# Patient Record
Sex: Male | Born: 1960 | Race: Black or African American | Hispanic: No | Marital: Single | Smoking: Never smoker
Health system: Southern US, Community
[De-identification: ages and names within clinical notes are randomized; demographics above are authoritative.]

## PROBLEM LIST (undated history)

## (undated) DIAGNOSIS — N529 Male erectile dysfunction, unspecified: Secondary | ICD-10-CM

## (undated) DIAGNOSIS — R229 Localized swelling, mass and lump, unspecified: Secondary | ICD-10-CM

## (undated) DIAGNOSIS — R319 Hematuria, unspecified: Secondary | ICD-10-CM

## (undated) DIAGNOSIS — E785 Hyperlipidemia, unspecified: Secondary | ICD-10-CM

## (undated) DIAGNOSIS — I10 Essential (primary) hypertension: Secondary | ICD-10-CM

## (undated) HISTORY — DX: Male erectile dysfunction, unspecified: N52.9

## (undated) HISTORY — DX: Hematuria, unspecified: R31.9

## (undated) HISTORY — PX: HERNIA REPAIR: SHX51

## (undated) HISTORY — DX: Essential (primary) hypertension: I10

## (undated) HISTORY — PX: ABDOMINAL SURGERY: SHX537

## (undated) HISTORY — DX: Hyperlipidemia, unspecified: E78.5

## (undated) HISTORY — DX: Localized swelling, mass and lump, unspecified: R22.9

---

## 2012-03-12 DIAGNOSIS — R319 Hematuria, unspecified: Secondary | ICD-10-CM

## 2012-03-12 HISTORY — DX: Hematuria, unspecified: R31.9

## 2012-04-23 ENCOUNTER — Telehealth: Payer: Self-pay | Admitting: Oncology

## 2012-04-23 NOTE — Telephone Encounter (Signed)
S/W pt finance' in re NP appt 9/18 @ 3 w/ Dr. Gaylyn Rong Referring Dr. Cain Saupe Dx-Abn CT subcutaneous nodules.  NP packet mail

## 2012-04-23 NOTE — Telephone Encounter (Signed)
C/D on 9/12 for appt 9/19. Attached ROI form to be filled out by pt to request records form [facility].

## 2012-04-24 ENCOUNTER — Encounter: Payer: Self-pay | Admitting: Oncology

## 2012-04-30 ENCOUNTER — Ambulatory Visit: Payer: Self-pay | Admitting: Oncology

## 2012-04-30 ENCOUNTER — Other Ambulatory Visit: Payer: Self-pay | Admitting: Lab

## 2012-04-30 ENCOUNTER — Ambulatory Visit: Payer: Self-pay

## 2012-05-08 ENCOUNTER — Other Ambulatory Visit: Payer: Self-pay

## 2012-05-08 ENCOUNTER — Encounter: Payer: Self-pay | Admitting: Oncology

## 2012-05-08 ENCOUNTER — Ambulatory Visit: Payer: Self-pay

## 2012-05-08 ENCOUNTER — Ambulatory Visit (HOSPITAL_BASED_OUTPATIENT_CLINIC_OR_DEPARTMENT_OTHER): Payer: BC Managed Care – PPO | Admitting: Oncology

## 2012-05-08 VITALS — BP 162/102 | HR 67 | Temp 98.5°F | Resp 20 | Ht 67.0 in | Wt 183.3 lb

## 2012-05-08 DIAGNOSIS — R229 Localized swelling, mass and lump, unspecified: Secondary | ICD-10-CM

## 2012-05-08 HISTORY — DX: Localized swelling, mass and lump, unspecified: R22.9

## 2012-05-08 NOTE — Patient Instructions (Addendum)
1.  Issue:  Subcutaneous nodules. 2.  Possibility:  Most likely benign (non cancerous).  However, since there are multiple, we may consider biopsy either by General Surgery or Dermatology. 3.  Follow up:  Pending result.

## 2012-05-09 NOTE — Progress Notes (Signed)
[facility]  Telephone:([phone]) [phone] Fax:(336) [phone]   MEDICAL ONCOLOGY - INITIAL CONSULATION    Referral MD:  Dr. Cain Saupe, M.D.   Reason for Referral: numerous subcutaneous nodules.   HPI:  [name] is a 51 year-old [place] American man with no significant past medical history.  He was in usual state of health until 03/2012 when he noted two day-duration of gross hematuria.  He was evaluated by Dr. Brunilda Payor.  He was given an empiric course of antibiotic with resolution of his hematuria.  He was recommended to undergo cystoscopy; however, he declined due to resolution of his hematuria.  He had an abdominal pelvic CT scan which was performed at [facility] on 04/15/2012 which was normal.  However, there were numerous subcutaneous nodules.  Therefore, he was kindly referred to the [facility] for evaluation.   [name] presented to the clinic today by himself.  He reported feeling well.  He no longer has hematuria.  With regard to the SQ nodules, he has had them for the past 15 years.  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  The largest ones are on his left flank and left elbow.  The nodules are throughout his body without any skin tag.  There is no cutaneous lesion, erythema.  The nodules do not burst open or cause pain without friction.  A male cousin who is his age from his father's side have similar nodules.   Patient denies fever, anorexia, weight loss, fatigue, headache, visual changes, confusion, drenching night sweats, palpable lymph node swelling, mucositis, odynophagia, dysphagia, nausea vomiting, jaundice, chest pain, palpitation, shortness of breath, dyspnea on exertion, productive cough, gum bleeding, epistaxis, hematemesis, hemoptysis, abdominal pain, abdominal swelling, early satiety, melena, hematochezia, hematuria, skin  rash, spontaneous bleeding, joint swelling, joint pain, heat or cold intolerance, bowel bladder incontinence, back pain, focal motor weakness, paresthesia, depression, suicidal or homicidal ideation, feeling hopelessness.     Past Medical History  Diagnosis Date  . HTN (hypertension)   . ED (erectile dysfunction)   . Subcutaneous nodule   . Hematuria 03/2012    evaluated by Kr. Nesi, CT showed no renal abn; recommended cystoscopy in 9/13; he declined.  . 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 No pertinent past surgical history.:  Current Outpatient Prescriptions  Medication Sig Dispense Refill  . amLODipine (NORVASC) 5 MG tablet Take 5 mg by mouth daily.         No Known Allergies:  Family History  Problem Relation Age of Onset  . Hyperlipidemia Mother   . Cancer Father     head and neck   . Heart defect Brother   . Heart defect Son   :  History   Social History  . Marital Status: Significant Other    Spouse Name: N/A    Number of Children: 6  . Years of Education: N/A   Occupational History  .  [employer]   Social History Main Topics  . Smoking status: Never Smoker   . Smokeless tobacco: Never Used  . Alcohol Use: No  . Drug Use: No  . Sexually Active:    Other Topics Concern  . Not on file   Social History Narrative  . No narrative on file  :  Pertinent items are noted in HPI.  Exam: ECOG 0  General:  well-nourished man, in no acute distress.  Eyes:  no scleral icterus.  ENT:  There were no oropharyngeal lesions.  Neck was without thyromegaly.  Lymphatics:  Negative cervical, supraclavicular or axillary adenopathy.  Respiratory: lungs were clear bilaterally without wheezing or crackles.  Cardiovascular:  Regular rate and rhythm, S1/S2, without murmur, rub or gallop.  There was no pedal edema.  GI:  abdomen was soft, flat, nontender, nondistended, without organomegaly.  Muscoloskeletal:  no spinal tenderness of palpation of vertebral  spine.  Skin exam was without echymosis, petichae.  There were innumerable subcutaneous nodules through the body:  His back, trunk, abdomen, bilateral arms.  There was no cutaneous lesions.  There was tenderness to palpation of the larger nodules (2-3cm).  There was no skin breakthrough, erythema, purulent discharge.   Neuro exam was nonfocal.  Patient was able to get on and off exam table without assistance.  Gait was normal.  Patient was alerted and oriented.  Attention was good.   Language was appropriate.  Mood was normal without depression.  Speech was not pressured.  Thought content was not tangential.     Assessment and Plan:   Issue:  Diffuse subcutaneous nodules:    Differential:  Long list:  But high on the list is inflammatory conditions (rheumatoid, sarcoid) since it could be hereditary.  Less likely are malignant causes such as sarcoma.  Less likely infectious (HIV, karposi) due to him being asymptomatic and potential familial nature of the disease.   Work up:  Given the extensive nature and recent progression, biopsy is indicated.  I referred patient to [facility].  I deferred sending extensive serologic work up until after biopsy.   Treatment:  Pending work up.    Thank you for this referral.     The length of time of the face-to-face encounter was 30 minutes. More than 50% of time was spent counseling and coordination of care.

## 2012-05-11 ENCOUNTER — Telehealth: Payer: Self-pay | Admitting: Oncology

## 2012-05-11 NOTE — Telephone Encounter (Signed)
lmonvm adviisng the pt of his appt with the surgeon on 05/21/2012 with dr Derrell Lolling and the f/u appt with dr ha in november

## 2012-05-21 ENCOUNTER — Encounter (INDEPENDENT_AMBULATORY_CARE_PROVIDER_SITE_OTHER): Payer: Self-pay | Admitting: General Surgery

## 2012-05-21 ENCOUNTER — Ambulatory Visit (INDEPENDENT_AMBULATORY_CARE_PROVIDER_SITE_OTHER): Payer: BC Managed Care – PPO | Admitting: General Surgery

## 2012-05-21 VITALS — BP 144/92 | HR 74 | Temp 98.4°F | Resp 14 | Ht 65.0 in | Wt 182.6 lb

## 2012-05-21 DIAGNOSIS — D179 Benign lipomatous neoplasm, unspecified: Secondary | ICD-10-CM
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
## 2012-06-05 ENCOUNTER — Telehealth (INDEPENDENT_AMBULATORY_CARE_PROVIDER_SITE_OTHER): Payer: Self-pay

## 2012-06-05 ENCOUNTER — Other Ambulatory Visit (INDEPENDENT_AMBULATORY_CARE_PROVIDER_SITE_OTHER): Payer: Self-pay | Admitting: General Surgery

## 2012-06-05 DIAGNOSIS — D1739 Benign lipomatous neoplasm of skin and subcutaneous tissue of other sites: Secondary | ICD-10-CM

## 2012-06-05 NOTE — Telephone Encounter (Signed)
Pt's girlfriend called to report that the pt's arm is very swollen.  I explained that some swelling is normal, but she is concerned.  Please advise.

## 2012-06-05 NOTE — Telephone Encounter (Signed)
Called pt girlfriend back to see how is arm is doing and see how much swelling he had in his arm . She stated that he left his home and she stated that she was scared that he was going to hurt them self because of the pain he was in. I told her to keep an eye on it and that there is always someone on call over the weekend

## 2012-06-08 ENCOUNTER — Telehealth (INDEPENDENT_AMBULATORY_CARE_PROVIDER_SITE_OTHER): Payer: Self-pay

## 2012-06-08 ENCOUNTER — Encounter (INDEPENDENT_AMBULATORY_CARE_PROVIDER_SITE_OTHER): Payer: Self-pay | Admitting: General Surgery

## 2012-06-08 NOTE — Telephone Encounter (Signed)
Pt called stating the swelling in his arm is going down. No redness,no fever and lower arm and hand are not swollen. 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 He states he is not ready to go back to work yet because his upper arm still has swelling and is sore. I advised him there is no notation in system as to how long he will need to be out of work. I advised him I will send msg to Dr Derrell Lolling and his assistant to review. Pt can be reached at [phone].

## 2012-06-10 NOTE — Telephone Encounter (Signed)
Close encounter 

## 2012-06-19 ENCOUNTER — Encounter (INDEPENDENT_AMBULATORY_CARE_PROVIDER_SITE_OTHER): Payer: Self-pay | Admitting: General Surgery

## 2012-06-19 ENCOUNTER — Encounter (INDEPENDENT_AMBULATORY_CARE_PROVIDER_SITE_OTHER): Payer: Self-pay

## 2012-06-19 ENCOUNTER — Ambulatory Visit (INDEPENDENT_AMBULATORY_CARE_PROVIDER_SITE_OTHER): Payer: BC Managed Care – PPO | Admitting: General Surgery

## 2012-06-19 VITALS — BP 136/80 | HR 70 | Temp 97.4°F | Resp 14 | Ht 66.5 in | Wt 190.0 lb

## 2012-06-19 DIAGNOSIS — Z09 Encounter for follow-up examination after completed treatment for conditions other than malignant neoplasm: Secondary | ICD-10-CM

## 2012-06-19 NOTE — Progress Notes (Signed)
Patient ID: Clifford Jenkins, male   DOB: 06/12/1961, 51 y.o.   MRN: [id number] The patient is a 51 year old male status post lipoma excisionson the left upper extremity.Marland Kitchen Exhibit healing well. He does have an area of concern of his left external form which has developed a fluid collection; however is not infected.there's been no drainage from this area no erythema. .  On exam: Was clean dry and intact. His left external forearm incision site has fluid collection areas of age her erythema.  Assessment and plan: 1.  Patient returned x4 weeks for wound check.  2. Patient applied Ace bandage to his forearm protected as well as increased the fluid reabsorption rate.  3. The patient is okay return work.

## 2012-07-10 ENCOUNTER — Ambulatory Visit: Payer: BC Managed Care – PPO | Admitting: Oncology

## 2012-07-10 NOTE — Progress Notes (Signed)
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  I [facility] the [facility].  I sent him a letter advising him to return to Gen Surg if any of the nodules significantly increase in size.

## 2012-07-17 ENCOUNTER — Ambulatory Visit (INDEPENDENT_AMBULATORY_CARE_PROVIDER_SITE_OTHER): Payer: BC Managed Care – PPO | Admitting: General Surgery

## 2012-07-17 ENCOUNTER — Encounter (INDEPENDENT_AMBULATORY_CARE_PROVIDER_SITE_OTHER): Payer: Self-pay | Admitting: General Surgery

## 2012-07-17 ENCOUNTER — Encounter (INDEPENDENT_AMBULATORY_CARE_PROVIDER_SITE_OTHER): Payer: BC Managed Care – PPO | Admitting: General Surgery

## 2012-07-17 VITALS — BP 117/75 | HR 70 | Temp 97.8°F | Resp 12 | Ht 66.5 in | Wt 184.8 lb

## 2012-07-17 DIAGNOSIS — Z09 Encounter for follow-up examination after completed treatment for conditions other than malignant neoplasm: Secondary | ICD-10-CM

## 2012-07-17 NOTE — Progress Notes (Signed)
Patient ID: ROD MAJERUS, male   DOB: Oct 06, 1960, 51 y.o.   MRN: [id number] The patient is a 51 year old male status post left upper extremity lipoma removals. Patient is to do well postoperatively. Some of her previous additional postoperative ecchymosis is resolved.  On exam was clean dry and intact.  Assessment and plan.  Patient to follow prn   Patient resumed daily activities.

## 2012-07-26 ENCOUNTER — Ambulatory Visit (INDEPENDENT_AMBULATORY_CARE_PROVIDER_SITE_OTHER): Payer: BC Managed Care – PPO | Admitting: Family Medicine

## 2012-07-26 VITALS — BP 140/98 | HR 62 | Temp 98.0°F | Resp 18 | Ht 66.58 in | Wt 189.0 lb

## 2012-07-26 DIAGNOSIS — R300 Dysuria: Secondary | ICD-10-CM

## 2012-07-26 DIAGNOSIS — N342 Other urethritis: Secondary | ICD-10-CM

## 2012-07-26 DIAGNOSIS — R319 Hematuria, unspecified: Secondary | ICD-10-CM

## 2012-07-26 LAB — POCT CBC
Granulocyte percent: 54.9 %G (ref 37–80)
Lymph, poc: 1.9 (ref 0.6–3.4)
MCHC: 31.7 g/dL — AB (ref 31.8–35.4)
MCV: 94.7 fL (ref 80–97)
MID (cbc): 0.5 (ref 0–0.9)
POC LYMPH PERCENT: 35.8 %L (ref 10–50)
Platelet Count, POC: 197 10*3/uL (ref 142–424)
RDW, POC: 14 %

## 2012-07-26 LAB — POCT UA - MICROSCOPIC ONLY
Mucus, UA: POSITIVE
Yeast, UA: NEGATIVE

## 2012-07-26 LAB — POCT URINALYSIS DIPSTICK
Bilirubin, UA: NEGATIVE
Ketones, UA: NEGATIVE
Leukocytes, UA: NEGATIVE
Nitrite, UA: NEGATIVE
Protein, UA: NEGATIVE

## 2012-07-26 MED ORDER — AZITHROMYCIN 250 MG PO TABS: ORAL_TABLET | ORAL | Status: DC

## 2012-07-26 MED ORDER — CEFTRIAXONE SODIUM 1 G IJ SOLR
250.0000 mg | Freq: Once | INTRAMUSCULAR | Status: AC
  Administered 2012-07-26: 250 mg via INTRAMUSCULAR

## 2012-07-26 MED ORDER — CEFTRIAXONE SODIUM 1 G IJ SOLR: 250.0000 mg | INTRAMUSCULAR | Status: DC

## 2012-07-26 NOTE — Progress Notes (Signed)
Subjective:    Patient ID: Clifford Jenkins, male    DOB: October 11, 1960, 51 y.o.   MRN: [id number]  HPI Clifford Jenkins is a 51 y.o. male Recreated note form office visit this morning (encounter accidentaly cancelled in error when patient returned for uriprobe collection). He was given Rocephin 250mg  injection, as well as rx for azithromycin at afternoon office visit - approx 12:30pm.  Subjective:    Patient ID: Clifford Jenkins, male    DOB: 30-Jan-1961, 51 y.o.   MRN: [id number]  HPI Clifford Jenkins is a 51 y.o. male Started yesterday morning with blood in urine, and had burning with urination.  Notices blood at end of urination.  Frequency this am.  No fever, no back pain, no N/V.   Similar sx's in July - told had some kind of infection   - seen by primary doctor - Clifford Jenkins at [facility]  No known hx of prostate problems - last checked in July - told was normal. No known hx of kidney stones.   Tx: no otc treatments.  Here with GF of 2 years.   No hx of STI's.  Notices a slight discharge in   Hx of HTN - followed by Dr. Jillyn Jenkins.  Review of Systems  Constitutional: Negative for fever, chills, fatigue and unexpected weight change.  Eyes: Negative for visual disturbance.  Respiratory: Negative for cough, chest tightness and shortness of breath.   Cardiovascular: Negative for chest pain, palpitations and leg swelling.  Gastrointestinal: Negative for abdominal pain and blood in stool.  Genitourinary: Positive for dysuria, frequency, hematuria and discharge (slight amt seen in past in underwear.  - had tests in July for STD's and negative?). Negative for testicular pain.  Musculoskeletal: Negative for back pain.  Skin: Negative for rash.  Neurological: Negative for dizziness, light-headedness and headaches.        Objective:   Physical Exam  Constitutional: He is oriented to person, place, and time. He appears well-developed and well-nourished.  HENT:  Head:  Normocephalic and atraumatic.  Cardiovascular: Normal rate, regular rhythm, normal heart sounds and intact distal pulses.   Pulmonary/Chest: Effort normal and breath sounds normal.  Abdominal: Soft. Bowel sounds are normal. He exhibits no distension. There is no tenderness. Hernia confirmed negative in the right inguinal area and confirmed negative in the left inguinal area.  Genitourinary: Testes normal and penis normal. Right testis shows no mass and no tenderness. Left testis shows no mass. No penile erythema or penile tenderness. No discharge found.  Lymphadenopathy:       Right: No inguinal adenopathy present.       Left: No inguinal adenopathy present.  Neurological: He is alert and oriented to person, place, and time.  Skin: Skin is warm. No rash noted.  Psychiatric: He has a normal mood and affect. His behavior is normal.   Results for orders placed in visit on 07/26/12  GC/CHLAMYDIA PROBE AMP, URINE      Component Value Range   Chlamydia, Swab/Urine, PCR NEGATIVE  NEGATIVE   GC Probe Amp, Urine NEGATIVE  NEGATIVE       Assessment & Plan:  Clifford Jenkins is a 51 y.o. male 1. Urethritis  GC/chlamydia probe amp, urine, POCT CBC, PSA, Urine culture, POCT urinalysis dipstick, POCT UA - Microscopic Only, azithromycin (ZITHROMAX) 250 MG tablet, DISCONTINUED: cefTRIAXone (ROCEPHIN) injection 250 mg  2. Hematuria  azithromycin (ZITHROMAX) 250 MG tablet   Urethritis with hematuria - returned to clinic for urirobe  at approximately 12:30.  uripribe obtained. Rocephin 250mg  IM, start azithromycin 250mg  - 4 x1. If symptoms not improving with above, may need urology eval for hematuria. Understanding expressed.  HTN - decreased control.  Check outside office and if still elevated - rtc or primary MD to discuss meds.        Review of Systems     Objective:   Physical Exam        Assessment & Plan:

## 2012-07-26 NOTE — Progress Notes (Signed)
Subjective:    Patient ID: Clifford Jenkins, male    DOB: June 14, 1961, 51 y.o.   MRN: [id number]  HPI Clifford Jenkins is a 51 y.o. male Started yesterday morning with blood in urine, and had burning with urination.  Notices blood at end of urination.  Frequency this am.  No fever, no back pain, no N/V.   Similar sx's in July - told had some kind of infection   - seen by primary doctor - [name] at [facility].  No known hx of prostate problems - last checked in July - told was normal. No known hx of kidney stones.   Tx: no otc treatments.  Here with GF of 2 years.   No hx of STI's.  Notices a slight discharge in   Hx of HTN - followed by Dr. Jillyn Hidden.  Review of Systems  Constitutional: Negative for fever, chills, fatigue and unexpected weight change.  Eyes: Negative for visual disturbance.  Respiratory: Negative for cough, chest tightness and shortness of breath.   Cardiovascular: Negative for chest pain, palpitations and leg swelling.  Gastrointestinal: Negative for abdominal pain and blood in stool.  Genitourinary: Positive for dysuria, frequency, hematuria and discharge (slight amt seen in past in underwear.  - had tests in July for STD's and negative?). Negative for testicular pain.  Musculoskeletal: Negative for back pain.  Skin: Negative for rash.  Neurological: Negative for dizziness, light-headedness and headaches.        Objective:   Physical Exam  Constitutional: He is oriented to person, place, and time. He appears well-developed and well-nourished.  HENT:  Head: Normocephalic and atraumatic.  Cardiovascular: Normal rate, regular rhythm, normal heart sounds and intact distal pulses.   Pulmonary/Chest: Effort normal and breath sounds normal.  Abdominal: Soft. Bowel sounds are normal. He exhibits no distension. There is no tenderness. Hernia confirmed negative in the right inguinal area and confirmed negative in the left inguinal area.  Genitourinary: Testes  normal and penis normal. Right testis shows no mass and no tenderness. Left testis shows no mass. No penile erythema or penile tenderness. No discharge found.  Lymphadenopathy:       Right: No inguinal adenopathy present.       Left: No inguinal adenopathy present.  Neurological: He is alert and oriented to person, place, and time.  Skin: Skin is warm. No rash noted.  Psychiatric: He has a normal mood and affect. His behavior is normal.   Results for orders placed in visit on 07/26/12  POCT URINALYSIS DIPSTICK      Component Value Range   Color, UA yellow     Clarity, UA clear     Glucose, UA neg     Bilirubin, UA neg     Ketones, UA neg     Spec Grav, UA >=1.030     Blood, UA small     pH, UA 5.5     Protein, UA neg     Urobilinogen, UA 0.2     Nitrite, UA neg     Leukocytes, UA Negative    POCT UA - MICROSCOPIC ONLY      Component Value Range   WBC, Ur, HPF, POC 2-8     RBC, urine, microscopic tntc     Bacteria, U Microscopic 3+     Mucus, UA pos     Epithelial cells, urine per micros 2-4     Crystals, Ur, HPF, POC neg     Casts, Ur, LPF, POC  RBC     Yeast, UA neg    POCT CBC      Component Value Range   WBC 5.2  4.6 - 10.2 K/uL   Lymph, poc 1.9  0.6 - 3.4   POC LYMPH PERCENT 35.8  10 - 50 %L   MID (cbc) 0.5  0 - 0.9   POC MID % 9.3  0 - 12 %M   POC Granulocyte 2.9  2 - 6.9   Granulocyte percent 54.9  37 - 80 %G   RBC 5.49  4.69 - 6.13 M/uL   Hemoglobin 16.5  14.1 - 18.1 g/dL   HCT, POC 16.1  09.6 - 53.7 %   MCV 94.7  80 - 97 fL   MCH, POC 30.1  27 - 31.2 pg   MCHC 31.7 (*) 31.8 - 35.4 g/dL   RDW, POC 04.5     Platelet Count, POC 197  142 - 424 K/uL   MPV 9.6  0 - 99.8 fL       Assessment & Plan:  Clifford Jenkins is a 51 y.o. male 1. Hematuria  POCT urinalysis dipstick, POCT UA - Microscopic Only, POCT CBC, PSA, Urine culture  2. Dysuria  POCT urinalysis dipstick, POCT UA - Microscopic Only, POCT CBC, PSA, Urine culture  3. Urethritis     Urethritis  with hematuria - returned to clinic for urirobe at approximately 12:30.  uripribe obtained. Rocephin 250mg  IM, start azithromycin 250mg  - 4 x1. If symptoms not improving with above, may need urology eval for hematuria. Understanding expressed.  HTN - decreased control.  Check outside office and if still elevated - rtc or primary MD to discuss meds.

## 2012-07-27 ENCOUNTER — Ambulatory Visit (INDEPENDENT_AMBULATORY_CARE_PROVIDER_SITE_OTHER): Payer: BC Managed Care – PPO | Admitting: Emergency Medicine

## 2012-07-27 VITALS — BP 175/96 | HR 53 | Temp 98.0°F | Resp 16 | Ht 67.0 in | Wt 189.6 lb

## 2012-07-27 DIAGNOSIS — N419 Inflammatory disease of prostate, unspecified: Secondary | ICD-10-CM

## 2012-07-27 LAB — GC/CHLAMYDIA PROBE AMP, URINE: GC Probe Amp, Urine: NEGATIVE

## 2012-07-27 MED ORDER — CIPROFLOXACIN HCL 500 MG PO TABS: 500.0000 mg | ORAL_TABLET | Freq: Two times a day (BID) | ORAL | Status: DC
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
## 2012-07-28 LAB — URINE CULTURE: Colony Count: NO GROWTH

## 2012-08-07 ENCOUNTER — Encounter: Payer: Self-pay | Admitting: Family Medicine

## 2012-08-31 ENCOUNTER — Encounter: Payer: Self-pay | Admitting: Radiology

## 2012-09-28 ENCOUNTER — Encounter: Payer: Self-pay | Admitting: Family Medicine

## 2013-01-26 ENCOUNTER — Ambulatory Visit (INDEPENDENT_AMBULATORY_CARE_PROVIDER_SITE_OTHER): Payer: BC Managed Care – PPO | Admitting: Emergency Medicine

## 2013-01-26 VITALS — BP 136/82 | HR 62 | Temp 98.1°F | Resp 18 | Ht 67.0 in | Wt 183.0 lb

## 2013-01-26 DIAGNOSIS — J209 Acute bronchitis, unspecified: Secondary | ICD-10-CM

## 2013-01-26 MED ORDER — AZITHROMYCIN 250 MG PO TABS: ORAL_TABLET | ORAL | Status: DC

## 2013-01-26 MED ORDER — HYDROCOD POLST-CHLORPHEN POLST 10-8 MG/5ML PO LQCR: 5.0000 mL | Freq: Two times a day (BID) | ORAL | Status: DC | PRN
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
## 2013-01-26 NOTE — Patient Instructions (Signed)

## 2013-12-15 ENCOUNTER — Other Ambulatory Visit: Payer: Self-pay | Admitting: Gastroenterology

## 2019-05-23 ENCOUNTER — Ambulatory Visit (HOSPITAL_COMMUNITY)
Admission: EM | Admit: 2019-05-23 | Discharge: 2019-05-23 | Disposition: A | Discharge status: Home / Self Care | Payer: BC Managed Care – PPO

## 2019-05-23 ENCOUNTER — Emergency Department (HOSPITAL_COMMUNITY): Payer: BC Managed Care – PPO

## 2019-05-23 ENCOUNTER — Encounter (HOSPITAL_COMMUNITY): Payer: Self-pay

## 2019-05-23 ENCOUNTER — Other Ambulatory Visit: Payer: Self-pay

## 2019-05-23 ENCOUNTER — Emergency Department (HOSPITAL_COMMUNITY)
Admission: EM | Admit: 2019-05-23 | Discharge: 2019-05-23 | Disposition: A | Discharge status: Home / Self Care | Payer: BC Managed Care – PPO | Attending: Emergency Medicine | Admitting: Emergency Medicine

## 2019-05-23 DIAGNOSIS — Z79899 Other long term (current) drug therapy: Secondary | ICD-10-CM | POA: Insufficient documentation

## 2019-05-23 DIAGNOSIS — R42 Dizziness and giddiness: Secondary | ICD-10-CM | POA: Insufficient documentation

## 2019-05-23 DIAGNOSIS — I10 Essential (primary) hypertension: Secondary | ICD-10-CM | POA: Diagnosis not present

## 2019-05-23 LAB — DIFFERENTIAL
Abs Immature Granulocytes: 0.01 10*3/uL (ref 0.00–0.07)
Basophils Absolute: 0 10*3/uL (ref 0.0–0.1)
Basophils Relative: 0 %
Eosinophils Absolute: 0.1 10*3/uL (ref 0.0–0.5)
Eosinophils Relative: 2 %
Immature Granulocytes: 0 %
Lymphocytes Relative: 35 %
Lymphs Abs: 1.8 10*3/uL (ref 0.7–4.0)
Monocytes Absolute: 0.5 10*3/uL (ref 0.1–1.0)
Monocytes Relative: 10 %
Neutro Abs: 2.7 10*3/uL (ref 1.7–7.7)
Neutrophils Relative %: 53 %

## 2019-05-23 LAB — COMPREHENSIVE METABOLIC PANEL
ALT: 27 U/L (ref 0–44)
AST: 26 U/L (ref 15–41)
Albumin: 4.2 g/dL (ref 3.5–5.0)
Alkaline Phosphatase: 90 U/L (ref 38–126)
Anion gap: 8 (ref 5–15)
BUN: 12 mg/dL (ref 6–20)
CO2: 28 mmol/L (ref 22–32)
Calcium: 9.5 mg/dL (ref 8.9–10.3)
Chloride: 104 mmol/L (ref 98–111)
Creatinine, Ser: 1.2 mg/dL (ref 0.61–1.24)
GFR calc Af Amer: >60 mL/min (ref >60)
GFR calc non Af Amer: >60 mL/min (ref >60)
Glucose, Bld: 113 mg/dL — ABNORMAL HIGH (ref 70–99)
Potassium: 3.8 mmol/L (ref 3.5–5.1)
Sodium: 140 mmol/L (ref 135–145)
Total Bilirubin: 0.6 mg/dL (ref 0.3–1.2)
Total Protein: 7.7 g/dL (ref 6.5–8.1)

## 2019-05-23 LAB — CBG MONITORING, ED
Glucose-Capillary: 102 mg/dL — ABNORMAL HIGH (ref 70–99)
Glucose-Capillary: 119 mg/dL — ABNORMAL HIGH (ref 70–99)

## 2019-05-23 LAB — CBC
HCT: 50.2 % (ref 39.0–52.0)
Hemoglobin: 17.9 g/dL — ABNORMAL HIGH (ref 13.0–17.0)
MCH: 32.1 pg (ref 26.0–34.0)
MCHC: 35.7 g/dL (ref 30.0–36.0)
MCV: 90 fL (ref 80.0–100.0)
Platelets: 176 10*3/uL (ref 150–400)
RBC: 5.58 MIL/uL (ref 4.22–5.81)
RDW: 12.2 % (ref 11.5–15.5)
WBC: 5 10*3/uL (ref 4.0–10.5)
nRBC: 0 % (ref 0.0–0.2)

## 2019-05-23 LAB — PROTIME-INR
INR: 1 (ref 0.8–1.2)
Prothrombin Time: 12.8 seconds (ref 11.4–15.2)

## 2019-05-23 LAB — APTT: aPTT: 29 seconds (ref 24–36)

## 2019-05-23 MED ORDER — SODIUM CHLORIDE 0.9 % IV BOLUS
1000.0000 mL | Freq: Once | INTRAVENOUS | Status: AC
  Administered 2019-05-23: 12:00:00 1000 mL via INTRAVENOUS

## 2019-05-23 MED ORDER — MECLIZINE HCL 25 MG PO TABS: 25.0000 mg | ORAL_TABLET | Freq: Three times a day (TID) | ORAL | 0 refills | Status: DC | PRN

## 2019-05-23 MED ORDER — SODIUM CHLORIDE 0.9% FLUSH
3.0000 mL | Freq: Once | INTRAVENOUS | Status: AC
  Administered 2019-05-23: 3 mL via INTRAVENOUS

## 2019-05-23 MED ORDER — MECLIZINE HCL 25 MG PO TABS
25.0000 mg | ORAL_TABLET | Freq: Once | ORAL | Status: AC
  Administered 2019-05-23: 25 mg via ORAL
  Filled 2019-05-23: qty 1
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
## 2019-05-23 NOTE — ED Notes (Signed)
Per Dr. Meda Coffee, pt is to be evaluated in the ED due to loss of consciousness and dizziness.

## 2019-05-23 NOTE — ED Triage Notes (Signed)
Patient complains of dizziness with any movement x 5 days. States that it started with a general weakness. Patient alert and oriented. Denies fever, no cough.

## 2019-05-23 NOTE — ED Notes (Signed)
Pt verbalized understanding of discharge paperwork and prescriptions.  °

## 2019-05-23 NOTE — Discharge Instructions (Signed)
As we discussed, take meclizine as directed.  Follow-up with your primary care doctor in the next 2 to 4 days for further evaluation.  Return the emergency department immediately if you have worsening dizziness, cannot walk, have trouble moving her arms or legs, difficulty speaking, chest pain, difficulty breathing or any other worsening or concerning symptoms.

## 2021-08-14 ENCOUNTER — Emergency Department (HOSPITAL_COMMUNITY): Payer: BC Managed Care – PPO

## 2021-08-14 ENCOUNTER — Encounter (HOSPITAL_COMMUNITY): Payer: Self-pay

## 2021-08-14 ENCOUNTER — Emergency Department (HOSPITAL_COMMUNITY)
Admission: EM | Admit: 2021-08-14 | Discharge: 2021-08-14 | Disposition: A | Discharge status: Home / Self Care | Payer: BC Managed Care – PPO | Attending: Emergency Medicine | Admitting: Emergency Medicine

## 2021-08-14 DIAGNOSIS — I10 Essential (primary) hypertension: Secondary | ICD-10-CM | POA: Insufficient documentation

## 2021-08-14 DIAGNOSIS — X500XXA Overexertion from strenuous movement or load, initial encounter: Secondary | ICD-10-CM | POA: Diagnosis not present

## 2021-08-14 DIAGNOSIS — M545 Low back pain, unspecified: Secondary | ICD-10-CM | POA: Insufficient documentation

## 2021-08-14 DIAGNOSIS — M546 Pain in thoracic spine: Secondary | ICD-10-CM | POA: Insufficient documentation

## 2021-08-14 DIAGNOSIS — G8929 Other chronic pain: Secondary | ICD-10-CM | POA: Diagnosis not present

## 2021-08-14 MED ORDER — METHOCARBAMOL 500 MG PO TABS
1000.0000 mg | ORAL_TABLET | Freq: Once | ORAL | Status: AC
  Administered 2021-08-14: 1000 mg via ORAL
  Filled 2021-08-14: qty 2

## 2021-08-14 MED ORDER — IBUPROFEN 400 MG PO TABS
600.0000 mg | ORAL_TABLET | Freq: Once | ORAL | Status: AC
  Administered 2021-08-14: 600 mg via ORAL
  Filled 2021-08-14: qty 1

## 2021-08-14 MED ORDER — METHOCARBAMOL 500 MG PO TABS: 500.0000 mg | ORAL_TABLET | Freq: Two times a day (BID) | ORAL | 0 refills | Status: DC

## 2021-08-14 MED ORDER — DEXAMETHASONE SODIUM PHOSPHATE 10 MG/ML IJ SOLN
10.0000 mg | Freq: Once | INTRAMUSCULAR | Status: AC
  Administered 2021-08-14: 10 mg via INTRAMUSCULAR
  Filled 2021-08-14: qty 1

## 2021-08-14 NOTE — ED Triage Notes (Signed)
Patient complains of ongoing lower back pain x 3 weeks. Has been seen at [facility] x 2 and taken steroids and muscle relaxer with minimal relief

## 2021-08-14 NOTE — ED Provider Notes (Signed)
Emergency Medicine Provider Triage Evaluation Note  Clifford Jenkins , a 61 y.o. male  was evaluated in triage.  Pt complains of low back pain. States same started x 3 weeks ago when he was lifting heavy boxes. Went to [facility] who gave him steroids and muscle relaxers, had some relief but he is now out of both and his pain has returned. He states that he can walk with some pain but 'stumbles around some.' No loss of bowel or bladder control, numbness/tingling in lower extremities, or sharp shooting pain down the legs. No fevers or chills  Review of Systems  Positive:  Negative: See above  Physical Exam  BP (!) 149/98 (BP Location: Right Arm)    Pulse 78    Temp 98.2 F (36.8 C) (Oral)    Resp 18    SpO2 100%  Gen:   Awake, no distress   Resp:  Normal effort  MSK:   Moves extremities without difficulty  Other:    Medical Decision Making  Medically screening exam initiated at 9:33 AM.  Appropriate orders placed.  Clifford Jenkins was informed that the remainder of the evaluation will be completed by another provider, this initial triage assessment does not replace that evaluation, and the importance of remaining in the ED until their evaluation is complete.     Clifford Jenkins 08/14/21 0935    Clifford Boss, MD 08/14/21 [phone]

## 2021-08-14 NOTE — ED Provider Notes (Signed)
[facility] EMERGENCY DEPARTMENT Provider Note   CSN: [id number] Arrival date & time: 08/14/21  0857     History  No chief complaint on file.   Clifford Jenkins is a 61 y.o. male with history of erectile dysfunction, hypertension.  Patient presents due to 2 to 3 weeks of low back pain.  Patient states that he works at [employer] and has to bend over to pick up heavy objects throughout the course of his day many times.  Patient has been seen for the same complaint in urgent care and prescribed muscle relaxers, NSAIDs and steroids.  Patient states that these alleviate symptoms but low back pain always returns.  Patient has not explored other options such as physical therapy or bracing.  Patient endorses low back pain without radiation in the legs.  Patient denies groin numbness, urinary or bowel dysfunction, lower extremity weakness.  HPI     Home Medications Prior to Admission medications   Medication Sig Start Date End Date Taking? Authorizing Provider  methocarbamol (ROBAXIN) 500 MG tablet Take 1 tablet (500 mg total) by mouth 2 (two) times daily. 08/14/21  Yes Azucena Cecil, PA-C  amLODipine (NORVASC) 10 MG tablet Take 10 mg by mouth daily. 05/12/19   [provider]  azithromycin (ZITHROMAX) 250 MG tablet Take 4 tabs by mouth once. Patient not taking: Reported on 05/23/2019 07/26/12   Wendie Agreste, MD  azithromycin (ZITHROMAX) 250 MG tablet Take 2 tabs PO x 1 dose, then 1 tab PO QD x 4 days Patient not taking: Reported on 05/23/2019 01/26/13   Roselee Culver, MD  chlorpheniramine-HYDROcodone [facility] PENNKINETIC ER) 10-8 MG/5ML LQCR Take 5 mLs by mouth every 12 (twelve) hours as needed. Patient not taking: Reported on 05/23/2019 01/26/13   Roselee Culver, MD  ciprofloxacin (CIPRO) 500 MG tablet Take 1 tablet (500 mg total) by mouth 2 (two) times daily. Patient not taking: Reported on 05/23/2019 07/27/12   Roselee Culver, MD  meclizine  (ANTIVERT) 25 MG tablet Take 1 tablet (25 mg total) by mouth 3 (three) times daily as needed for dizziness. 05/23/19   Volanda Napoleon, PA-C  valsartan (DIOVAN) 160 MG tablet Take 160 mg by mouth daily. 05/12/19   [provider]      Allergies    Patient has no known allergies.    Review of Systems   Review of Systems  Genitourinary:  Negative for difficulty urinating.  Musculoskeletal:  Positive for back pain and myalgias.  Neurological:  Negative for weakness and numbness.  All other systems reviewed and are negative.  Physical Exam Updated Vital Signs BP (!) 166/106    Pulse 83    Temp 98.2 F (36.8 C)    Resp 20    SpO2 98%  Physical Exam Vitals and nursing note reviewed.  Constitutional:      General: He is not in acute distress.    Appearance: He is not ill-appearing or toxic-appearing.  HENT:     Head: Normocephalic.     Mouth/Throat:     Mouth: Mucous membranes are moist.  Eyes:     Pupils: Pupils are equal, round, and reactive to light.  Cardiovascular:     Rate and Rhythm: Normal rate and regular rhythm.  Pulmonary:     Effort: Pulmonary effort is normal.     Breath sounds: Normal breath sounds. No wheezing.  Abdominal:     General: Abdomen is flat.     Palpations: Abdomen is  soft.     Tenderness: There is no abdominal tenderness.  Musculoskeletal:        General: Tenderness present.     Cervical back: Normal and normal range of motion. No rigidity or tenderness.     Thoracic back: Tenderness present. No swelling, edema, deformity, signs of trauma or spasms. Normal range of motion.     Lumbar back: Tenderness present.     Comments: Patient with diffuse tenderness noted to bilateral lower back.  Pain does not radiate into either leg.  Patient has appropriate range of motion.   Skin:    General: Skin is warm and dry.     Capillary Refill: Capillary refill takes less than 2 seconds.  Neurological:     Mental Status: He is alert and oriented to person,  place, and time.     GCS: GCS eye subscore is 4. GCS verbal subscore is 5. GCS motor subscore is 6.     Cranial Nerves: Cranial nerves 2-12 are intact.     Sensory: Sensation is intact. No sensory deficit.     Motor: Motor function is intact. No weakness or abnormal muscle tone.     Coordination: Coordination is intact.    ED Results / Procedures / Treatments   Labs (all labs ordered are listed, but only abnormal results are displayed) Labs Reviewed - No data to display  EKG None  Radiology DG Lumbar Spine Complete  Result Date: 08/14/2021 CLINICAL DATA:  Low back pain, hurt while lifting boxes EXAM: LUMBAR SPINE - COMPLETE 4+ VIEW COMPARISON:  None. FINDINGS: There are 5 non-rib-bearing lumbar type vertebral bodies. Vertebral body heights are preserved. Alignment is normal. There is no spondylolysis. The disc spaces are overall preserved. There is minimal degenerative endplate change and mild facet arthropathy in the lower lumbar spine. The SI joints and symphysis pubis are intact. The soft tissues are unremarkable. IMPRESSION: Mild degenerative changes in the lower lumbar spine. No acute findings. Electronically Signed   By: Valetta Mole M.D.   On: 08/14/2021 10:02    Procedures Procedures   Medications Ordered in ED Medications  methocarbamol (ROBAXIN) tablet 1,000 mg (1,000 mg Oral Given 08/14/21 1239)  dexamethasone (DECADRON) injection 10 mg (10 mg Intramuscular Given 08/14/21 1239)  ibuprofen (ADVIL) tablet 600 mg (600 mg Oral Given 08/14/21 1239)    ED Course/ Medical Decision Making/ A&P                           Medical Decision Making  61 year old male presents due to chronic low back pain.  Patient has been seen for this complaint 2 times in urgent care and prescribed steroids, muscle relaxer and NSAIDs.  Patient works at [employer] and is constantly bending over to pick up heavy boxes.  Patient states states that these relieve symptoms but symptoms always return.  Patient does  not have any red flag symptoms.  No groin numbness, bowel or bladder dysfunction or lower extremity weakness.  Patient has diffuse bilateral low back pain without radiation.  Patient has appropriate range of motion.  Lumbar x-ray shows no sign of fracture, there are some mild degenerative changes.  I treated the patient with muscle relaxer, steroid shot and ibuprofen while in the department.  Patient states that these medications alleviated pain.  Patient is now feeling more comfortable and better.  Patient has close follow-up with an outpatient appointment with his PCP scheduled for tomorrow.  I feel comfortable discharging this  patient in his current state.  I have given this patient return precautions which he voiced understanding with.  I have advised the patient that he will most likely need physical therapy and a back brace in order to fully address his symptoms.  He expressed understanding with this.  All the patient's questions were answered.  This patient is stable at discharge.  Final Clinical Impression(s) / ED Diagnoses Final diagnoses:  Chronic bilateral low back pain without sciatica    Rx / DC Orders ED Discharge Orders          Ordered    methocarbamol (ROBAXIN) 500 MG tablet  2 times daily        08/14/21 1247              Azucena Cecil, PA-C 08/14/21 1300    Pattricia Boss, MD 08/14/21 1623

## 2021-08-14 NOTE — Discharge Instructions (Addendum)
Return to ED with any new or worsening symptoms such as groin numbness, lower extremity weakness, inability to control bowel  I have sent you a muscle relaxer to your pharmacy as we discussed please pick this up the earliest convenience Please follow-up with your PCP tomorrow as previously discussed

## 2021-08-18 ENCOUNTER — Other Ambulatory Visit: Payer: Self-pay

## 2021-08-18 ENCOUNTER — Emergency Department (HOSPITAL_COMMUNITY): Payer: BC Managed Care – PPO

## 2021-08-18 ENCOUNTER — Inpatient Hospital Stay (HOSPITAL_COMMUNITY)
Admission: EM | Admit: 2021-08-18 | Discharge: 2021-08-23 | DRG: 457 | Disposition: A | Discharge status: Rehabilitation Facility | Payer: BC Managed Care – PPO | Attending: Neurosurgery | Admitting: Neurosurgery

## 2021-08-18 DIAGNOSIS — M898X5 Other specified disorders of bone, thigh: Secondary | ICD-10-CM

## 2021-08-18 DIAGNOSIS — C61 Malignant neoplasm of prostate: Secondary | ICD-10-CM | POA: Diagnosis present

## 2021-08-18 DIAGNOSIS — G8220 Paraplegia, unspecified: Secondary | ICD-10-CM | POA: Diagnosis present

## 2021-08-18 DIAGNOSIS — Z808 Family history of malignant neoplasm of other organs or systems: Secondary | ICD-10-CM

## 2021-08-18 DIAGNOSIS — D492 Neoplasm of unspecified behavior of bone, soft tissue, and skin: Secondary | ICD-10-CM | POA: Diagnosis present

## 2021-08-18 DIAGNOSIS — R29898 Other symptoms and signs involving the musculoskeletal system: Secondary | ICD-10-CM

## 2021-08-18 DIAGNOSIS — Z20822 Contact with and (suspected) exposure to covid-19: Secondary | ICD-10-CM | POA: Diagnosis present

## 2021-08-18 DIAGNOSIS — M4804 Spinal stenosis, thoracic region: Secondary | ICD-10-CM | POA: Diagnosis present

## 2021-08-18 DIAGNOSIS — G9520 Unspecified cord compression: Secondary | ICD-10-CM | POA: Diagnosis present

## 2021-08-18 DIAGNOSIS — I10 Essential (primary) hypertension: Secondary | ICD-10-CM | POA: Diagnosis present

## 2021-08-18 DIAGNOSIS — C412 Malignant neoplasm of vertebral column: Secondary | ICD-10-CM | POA: Diagnosis not present

## 2021-08-18 DIAGNOSIS — C7951 Secondary malignant neoplasm of bone: Principal | ICD-10-CM | POA: Diagnosis present

## 2021-08-18 DIAGNOSIS — E663 Overweight: Secondary | ICD-10-CM | POA: Diagnosis present

## 2021-08-18 DIAGNOSIS — Z6829 Body mass index (BMI) 29.0-29.9, adult: Secondary | ICD-10-CM

## 2021-08-18 DIAGNOSIS — M899 Disorder of bone, unspecified: Secondary | ICD-10-CM

## 2021-08-18 DIAGNOSIS — Z79899 Other long term (current) drug therapy: Secondary | ICD-10-CM

## 2021-08-18 DIAGNOSIS — Z419 Encounter for procedure for purposes other than remedying health state, unspecified: Secondary | ICD-10-CM

## 2021-08-18 DIAGNOSIS — K5900 Constipation, unspecified: Secondary | ICD-10-CM | POA: Diagnosis present

## 2021-08-18 MED ORDER — KETOROLAC TROMETHAMINE 60 MG/2ML IM SOLN
30.0000 mg | Freq: Once | INTRAMUSCULAR | Status: AC
  Administered 2021-08-18: 30 mg via INTRAMUSCULAR
  Filled 2021-08-18: qty 2
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
## 2021-08-18 NOTE — ED Notes (Signed)
Patient complaining of worsening abd pain. Triage provider notified and vitals updated

## 2021-08-18 NOTE — ED Triage Notes (Signed)
Pt c/o constipation for the last 2 days. Pt also reports back pain and leg weakness. Pt denies nausea or vomiting.

## 2021-08-19 ENCOUNTER — Emergency Department (HOSPITAL_COMMUNITY): Payer: BC Managed Care – PPO

## 2021-08-19 ENCOUNTER — Encounter (HOSPITAL_COMMUNITY): Payer: Self-pay | Admitting: Certified Registered Nurse Anesthetist

## 2021-08-19 ENCOUNTER — Encounter (HOSPITAL_COMMUNITY)
Admission: EM | Disposition: A | Discharge status: Rehabilitation Facility | Payer: Self-pay | Source: Home / Self Care | Attending: Neurosurgery

## 2021-08-19 ENCOUNTER — Emergency Department (HOSPITAL_COMMUNITY): Payer: BC Managed Care – PPO | Admitting: Certified Registered Nurse Anesthetist

## 2021-08-19 DIAGNOSIS — C412 Malignant neoplasm of vertebral column: Secondary | ICD-10-CM | POA: Diagnosis present

## 2021-08-19 DIAGNOSIS — Z79899 Other long term (current) drug therapy: Secondary | ICD-10-CM | POA: Diagnosis not present

## 2021-08-19 DIAGNOSIS — G9520 Unspecified cord compression: Secondary | ICD-10-CM | POA: Diagnosis present

## 2021-08-19 DIAGNOSIS — M4804 Spinal stenosis, thoracic region: Secondary | ICD-10-CM | POA: Diagnosis present

## 2021-08-19 DIAGNOSIS — G8220 Paraplegia, unspecified: Secondary | ICD-10-CM | POA: Diagnosis present

## 2021-08-19 DIAGNOSIS — K5900 Constipation, unspecified: Secondary | ICD-10-CM | POA: Diagnosis present

## 2021-08-19 DIAGNOSIS — R609 Edema, unspecified: Secondary | ICD-10-CM | POA: Diagnosis not present

## 2021-08-19 DIAGNOSIS — I10 Essential (primary) hypertension: Secondary | ICD-10-CM | POA: Diagnosis present

## 2021-08-19 DIAGNOSIS — E663 Overweight: Secondary | ICD-10-CM | POA: Diagnosis present

## 2021-08-19 DIAGNOSIS — D492 Neoplasm of unspecified behavior of bone, soft tissue, and skin: Secondary | ICD-10-CM | POA: Diagnosis present

## 2021-08-19 DIAGNOSIS — Z808 Family history of malignant neoplasm of other organs or systems: Secondary | ICD-10-CM | POA: Diagnosis not present

## 2021-08-19 DIAGNOSIS — C61 Malignant neoplasm of prostate: Secondary | ICD-10-CM | POA: Diagnosis present

## 2021-08-19 DIAGNOSIS — Z6829 Body mass index (BMI) 29.0-29.9, adult: Secondary | ICD-10-CM | POA: Diagnosis not present

## 2021-08-19 DIAGNOSIS — C7951 Secondary malignant neoplasm of bone: Secondary | ICD-10-CM | POA: Diagnosis present

## 2021-08-19 DIAGNOSIS — Z20822 Contact with and (suspected) exposure to covid-19: Secondary | ICD-10-CM | POA: Diagnosis present

## 2021-08-19 HISTORY — PX: LAMINECTOMY: SHX219

## 2021-08-19 LAB — POCT I-STAT 7, (LYTES, BLD GAS, ICA,H+H)
Acid-Base Excess: 4 mmol/L — ABNORMAL HIGH (ref 0.0–2.0)
Bicarbonate: 28.1 mmol/L — ABNORMAL HIGH (ref 20.0–28.0)
Calcium, Ion: 1.16 mmol/L (ref 1.15–1.40)
HCT: 39 % (ref 39.0–52.0)
Hemoglobin: 13.3 g/dL (ref 13.0–17.0)
O2 Saturation: 100 %
Potassium: 4.4 mmol/L (ref 3.5–5.1)
Sodium: 133 mmol/L — ABNORMAL LOW (ref 135–145)
TCO2: 29 mmol/L (ref 22–32)
pCO2 arterial: 41.7 mmHg (ref 32.0–48.0)
pH, Arterial: 7.438 (ref 7.350–7.450)
pO2, Arterial: 319 mmHg — ABNORMAL HIGH (ref 83.0–108.0)

## 2021-08-19 LAB — BASIC METABOLIC PANEL
Anion gap: 13 (ref 5–15)
BUN: 9 mg/dL (ref 6–20)
CO2: 26 mmol/L (ref 22–32)
Calcium: 9.7 mg/dL (ref 8.9–10.3)
Chloride: 96 mmol/L — ABNORMAL LOW (ref 98–111)
Creatinine, Ser: 0.91 mg/dL (ref 0.61–1.24)
GFR, Estimated: >60 mL/min (ref >60)
Glucose, Bld: 134 mg/dL — ABNORMAL HIGH (ref 70–99)
Potassium: 3.9 mmol/L (ref 3.5–5.1)
Sodium: 135 mmol/L (ref 135–145)

## 2021-08-19 LAB — URINALYSIS, COMPLETE (UACMP) WITH MICROSCOPIC
Bacteria, UA: NONE SEEN
Bilirubin Urine: NEGATIVE
Glucose, UA: NEGATIVE mg/dL
Ketones, ur: NEGATIVE mg/dL
Leukocytes,Ua: NEGATIVE
Nitrite: NEGATIVE
Protein, ur: NEGATIVE mg/dL
Specific Gravity, Urine: 1.015 (ref 1.005–1.030)
pH: 6.5 (ref 5.0–8.0)

## 2021-08-19 LAB — CBC WITH DIFFERENTIAL/PLATELET
Abs Immature Granulocytes: 0.11 10*3/uL — ABNORMAL HIGH (ref 0.00–0.07)
Basophils Absolute: 0 10*3/uL (ref 0.0–0.1)
Basophils Relative: 0 %
Eosinophils Absolute: 0 10*3/uL (ref 0.0–0.5)
Eosinophils Relative: 0 %
HCT: 46.8 % (ref 39.0–52.0)
Hemoglobin: 15.6 g/dL (ref 13.0–17.0)
Immature Granulocytes: 1 %
Lymphocytes Relative: 14 %
Lymphs Abs: 1.5 10*3/uL (ref 0.7–4.0)
MCH: 30.2 pg (ref 26.0–34.0)
MCHC: 33.3 g/dL (ref 30.0–36.0)
MCV: 90.5 fL (ref 80.0–100.0)
Monocytes Absolute: 1.1 10*3/uL — ABNORMAL HIGH (ref 0.1–1.0)
Monocytes Relative: 10 %
Neutro Abs: 8 10*3/uL — ABNORMAL HIGH (ref 1.7–7.7)
Neutrophils Relative %: 75 %
Platelets: 221 10*3/uL (ref 150–400)
RBC: 5.17 MIL/uL (ref 4.22–5.81)
RDW: 12.3 % (ref 11.5–15.5)
WBC: 10.8 10*3/uL — ABNORMAL HIGH (ref 4.0–10.5)
nRBC: 0 % (ref 0.0–0.2)

## 2021-08-19 LAB — RESP PANEL BY RT-PCR (FLU A&B, COVID) ARPGX2
Influenza A by PCR: NEGATIVE
Influenza B by PCR: NEGATIVE
SARS Coronavirus 2 by RT PCR: NEGATIVE

## 2021-08-19 LAB — TYPE AND SCREEN
ABO/RH(D): O POS
Antibody Screen: NEGATIVE

## 2021-08-19 LAB — ABO/RH: ABO/RH(D): O POS

## 2021-08-19 LAB — SURGICAL PCR SCREEN
MRSA, PCR: NEGATIVE
Staphylococcus aureus: NEGATIVE

## 2021-08-19 SURGERY — THORACIC LAMINECTOMY FOR TUMOR
Anesthesia: General | Site: Spine Thoracic

## 2021-08-19 MED ORDER — ROCURONIUM BROMIDE 10 MG/ML (PF) SYRINGE
PREFILLED_SYRINGE | INTRAVENOUS | Status: AC
  Filled 2021-08-19: qty 10

## 2021-08-19 MED ORDER — IRBESARTAN 150 MG PO TABS
150.0000 mg | ORAL_TABLET | Freq: Every day | ORAL | Status: DC
  Administered 2021-08-19 – 2021-08-23 (×5): 150 mg via ORAL
  Filled 2021-08-19 (×5): qty 1

## 2021-08-19 MED ORDER — THROMBIN 5000 UNITS EX SOLR: CUTANEOUS | Status: DC | PRN

## 2021-08-19 MED ORDER — PHENOL 1.4 % MT LIQD: 1.0000 | OROMUCOSAL | Status: DC | PRN

## 2021-08-19 MED ORDER — MIDAZOLAM HCL 2 MG/2ML IJ SOLN
INTRAMUSCULAR | Status: DC | PRN
  Administered 2021-08-19: 2 mg via INTRAVENOUS

## 2021-08-19 MED ORDER — SODIUM CHLORIDE 0.9% FLUSH
3.0000 mL | Freq: Two times a day (BID) | INTRAVENOUS | Status: DC
  Administered 2021-08-19 – 2021-08-23 (×9): 3 mL via INTRAVENOUS

## 2021-08-19 MED ORDER — BUPIVACAINE-EPINEPHRINE 0.5% -1:200000 IJ SOLN
INTRAMUSCULAR | Status: DC | PRN
  Administered 2021-08-19: 10 mL

## 2021-08-19 MED ORDER — CHLORHEXIDINE GLUCONATE CLOTH 2 % EX PADS
6.0000 | MEDICATED_PAD | Freq: Every day | CUTANEOUS | Status: DC
  Administered 2021-08-19 – 2021-08-23 (×4): 6 via TOPICAL

## 2021-08-19 MED ORDER — THROMBIN 20000 UNITS EX SOLR
CUTANEOUS | Status: AC
  Filled 2021-08-19: qty 20000

## 2021-08-19 MED ORDER — FENTANYL CITRATE (PF) 250 MCG/5ML IJ SOLN
INTRAMUSCULAR | Status: AC
  Filled 2021-08-19: qty 5

## 2021-08-19 MED ORDER — ONDANSETRON HCL 4 MG/2ML IJ SOLN: 4.0000 mg | Freq: Four times a day (QID) | INTRAMUSCULAR | Status: DC | PRN

## 2021-08-19 MED ORDER — ONDANSETRON HCL 4 MG/2ML IJ SOLN
INTRAMUSCULAR | Status: DC | PRN
  Administered 2021-08-19: 4 mg via INTRAVENOUS

## 2021-08-19 MED ORDER — PROPOFOL 10 MG/ML IV BOLUS
INTRAVENOUS | Status: DC | PRN
  Administered 2021-08-19 (×2): 20 mg via INTRAVENOUS
  Administered 2021-08-19: 50 mg via INTRAVENOUS
  Administered 2021-08-19: 200 mg via INTRAVENOUS

## 2021-08-19 MED ORDER — PROPOFOL 10 MG/ML IV BOLUS
INTRAVENOUS | Status: AC
  Filled 2021-08-19: qty 20

## 2021-08-19 MED ORDER — CEFAZOLIN SODIUM-DEXTROSE 2-4 GM/100ML-% IV SOLN
2.0000 g | Freq: Three times a day (TID) | INTRAVENOUS | Status: AC
  Administered 2021-08-19 – 2021-08-20 (×2): 2 g via INTRAVENOUS
  Filled 2021-08-19 (×2): qty 100

## 2021-08-19 MED ORDER — SODIUM CHLORIDE 0.9% IV SOLUTION: Freq: Once | INTRAVENOUS | Status: DC

## 2021-08-19 MED ORDER — MORPHINE SULFATE (PF) 4 MG/ML IV SOLN: 4.0000 mg | INTRAVENOUS | Status: DC | PRN

## 2021-08-19 MED ORDER — PHENYLEPHRINE HCL-NACL 20-0.9 MG/250ML-% IV SOLN
INTRAVENOUS | Status: DC | PRN
Start: 2021-08-19 — End: 2021-08-19
  Administered 2021-08-19: 20 ug/min via INTRAVENOUS

## 2021-08-19 MED ORDER — OXYCODONE HCL 5 MG PO TABS: 10.0000 mg | ORAL_TABLET | ORAL | Status: DC | PRN

## 2021-08-19 MED ORDER — DEXAMETHASONE SODIUM PHOSPHATE 10 MG/ML IJ SOLN
INTRAMUSCULAR | Status: DC | PRN
  Administered 2021-08-19: 10 mg via INTRAVENOUS

## 2021-08-19 MED ORDER — CEFAZOLIN SODIUM-DEXTROSE 2-3 GM-%(50ML) IV SOLR
INTRAVENOUS | Status: DC | PRN
  Administered 2021-08-19: 2 g via INTRAVENOUS

## 2021-08-19 MED ORDER — LIDOCAINE-EPINEPHRINE 1 %-1:100000 IJ SOLN
INTRAMUSCULAR | Status: AC
  Filled 2021-08-19: qty 1

## 2021-08-19 MED ORDER — LACTATED RINGERS IV SOLN: INTRAVENOUS | Status: DC | PRN

## 2021-08-19 MED ORDER — SUGAMMADEX SODIUM 200 MG/2ML IV SOLN
INTRAVENOUS | Status: DC | PRN
Start: 2021-08-19 — End: 2021-08-19
  Administered 2021-08-19: 200 mg via INTRAVENOUS

## 2021-08-19 MED ORDER — CEFAZOLIN SODIUM 1 G IJ SOLR
INTRAMUSCULAR | Status: AC
  Filled 2021-08-19: qty 20

## 2021-08-19 MED ORDER — FENTANYL CITRATE (PF) 250 MCG/5ML IJ SOLN
INTRAMUSCULAR | Status: DC | PRN
  Administered 2021-08-19 (×5): 50 ug via INTRAVENOUS
  Administered 2021-08-19: 100 ug via INTRAVENOUS
  Administered 2021-08-19: 50 ug via INTRAVENOUS

## 2021-08-19 MED ORDER — ESMOLOL HCL 100 MG/10ML IV SOLN
INTRAVENOUS | Status: DC | PRN
  Administered 2021-08-19 (×2): 20 mg via INTRAVENOUS

## 2021-08-19 MED ORDER — ACETAMINOPHEN 650 MG RE SUPP: 650.0000 mg | RECTAL | Status: DC | PRN

## 2021-08-19 MED ORDER — PHENYLEPHRINE 40 MCG/ML (10ML) SYRINGE FOR IV PUSH (FOR BLOOD PRESSURE SUPPORT)
PREFILLED_SYRINGE | INTRAVENOUS | Status: DC | PRN
  Administered 2021-08-19 (×3): 80 ug via INTRAVENOUS

## 2021-08-19 MED ORDER — ONDANSETRON HCL 4 MG PO TABS: 4.0000 mg | ORAL_TABLET | Freq: Four times a day (QID) | ORAL | Status: DC | PRN

## 2021-08-19 MED ORDER — CHLORHEXIDINE GLUCONATE 0.12 % MT SOLN
15.0000 mL | Freq: Once | OROMUCOSAL | Status: AC
  Administered 2021-08-19: 15 mL via OROMUCOSAL
  Filled 2021-08-19: qty 15

## 2021-08-19 MED ORDER — LIDOCAINE 2% (20 MG/ML) 5 ML SYRINGE
INTRAMUSCULAR | Status: DC | PRN
  Administered 2021-08-19: 60 mg via INTRAVENOUS

## 2021-08-19 MED ORDER — LACTATED RINGERS IV SOLN: INTRAVENOUS | Status: DC

## 2021-08-19 MED ORDER — DEXAMETHASONE SODIUM PHOSPHATE 10 MG/ML IJ SOLN
INTRAMUSCULAR | Status: AC
  Filled 2021-08-19: qty 1

## 2021-08-19 MED ORDER — IOHEXOL 300 MG/ML  SOLN
75.0000 mL | Freq: Once | INTRAMUSCULAR | Status: AC | PRN
  Administered 2021-08-19: 75 mL via INTRAVENOUS

## 2021-08-19 MED ORDER — METHOCARBAMOL 500 MG PO TABS
500.0000 mg | ORAL_TABLET | Freq: Two times a day (BID) | ORAL | Status: DC
  Administered 2021-08-19 – 2021-08-23 (×8): 500 mg via ORAL
  Filled 2021-08-19 (×8): qty 1

## 2021-08-19 MED ORDER — ROCURONIUM BROMIDE 10 MG/ML (PF) SYRINGE
PREFILLED_SYRINGE | INTRAVENOUS | Status: DC | PRN
Start: 2021-08-19 — End: 2021-08-19
  Administered 2021-08-19 (×2): 30 mg via INTRAVENOUS
  Administered 2021-08-19: 70 mg via INTRAVENOUS

## 2021-08-19 MED ORDER — BISACODYL 10 MG RE SUPP
10.0000 mg | Freq: Every day | RECTAL | Status: DC | PRN
  Administered 2021-08-20: 10 mg via RECTAL
  Filled 2021-08-19: qty 1

## 2021-08-19 MED ORDER — SODIUM CHLORIDE 0.9% FLUSH: 3.0000 mL | INTRAVENOUS | Status: DC | PRN

## 2021-08-19 MED ORDER — MIDAZOLAM HCL 2 MG/2ML IJ SOLN
INTRAMUSCULAR | Status: AC
  Filled 2021-08-19: qty 2

## 2021-08-19 MED ORDER — ACETAMINOPHEN 10 MG/ML IV SOLN
INTRAVENOUS | Status: DC | PRN
  Administered 2021-08-19: 1000 mg via INTRAVENOUS

## 2021-08-19 MED ORDER — BACITRACIN ZINC 500 UNIT/GM EX OINT
TOPICAL_OINTMENT | CUTANEOUS | Status: AC
  Filled 2021-08-19: qty 28.35

## 2021-08-19 MED ORDER — HYDROMORPHONE HCL 1 MG/ML IJ SOLN: 0.2500 mg | INTRAMUSCULAR | Status: DC | PRN

## 2021-08-19 MED ORDER — GADOBUTROL 1 MMOL/ML IV SOLN
8.0000 mL | Freq: Once | INTRAVENOUS | Status: AC | PRN
  Administered 2021-08-19: 8 mL via INTRAVENOUS

## 2021-08-19 MED ORDER — SODIUM CHLORIDE 0.9 % IV SOLN
250.0000 mL | INTRAVENOUS | Status: DC
  Administered 2021-08-19: 250 mL via INTRAVENOUS

## 2021-08-19 MED ORDER — BUPIVACAINE LIPOSOME 1.3 % IJ SUSP
INTRAMUSCULAR | Status: AC
  Filled 2021-08-19: qty 20

## 2021-08-19 MED ORDER — THROMBIN 5000 UNITS EX SOLR
CUTANEOUS | Status: AC
  Filled 2021-08-19: qty 5000

## 2021-08-19 MED ORDER — OXYCODONE HCL 5 MG PO TABS
5.0000 mg | ORAL_TABLET | ORAL | Status: DC | PRN
  Administered 2021-08-19 – 2021-08-22 (×2): 5 mg via ORAL
  Filled 2021-08-19 (×2): qty 1

## 2021-08-19 MED ORDER — ONDANSETRON HCL 4 MG/2ML IJ SOLN
INTRAMUSCULAR | Status: AC
  Filled 2021-08-19: qty 2

## 2021-08-19 MED ORDER — BUPIVACAINE HCL (PF) 0.5 % IJ SOLN
INTRAMUSCULAR | Status: AC
  Filled 2021-08-19: qty 30

## 2021-08-19 MED ORDER — 0.9 % SODIUM CHLORIDE (POUR BTL) OPTIME
TOPICAL | Status: DC | PRN
  Administered 2021-08-19: 1000 mL

## 2021-08-19 MED ORDER — MENTHOL 3 MG MT LOZG
1.0000 | LOZENGE | OROMUCOSAL | Status: DC | PRN
  Filled 2021-08-19: qty 9

## 2021-08-19 MED ORDER — BACITRACIN ZINC 500 UNIT/GM EX OINT
TOPICAL_OINTMENT | CUTANEOUS | Status: DC | PRN
  Administered 2021-08-19: 1 via TOPICAL

## 2021-08-19 MED ORDER — SODIUM CHLORIDE 0.9 % IV BOLUS
500.0000 mL | Freq: Once | INTRAVENOUS | Status: AC
  Administered 2021-08-19: 500 mL via INTRAVENOUS

## 2021-08-19 MED ORDER — DEXAMETHASONE SODIUM PHOSPHATE 10 MG/ML IJ SOLN
8.0000 mg | Freq: Once | INTRAMUSCULAR | Status: AC
Start: 2021-08-19 — End: 2021-08-19
  Administered 2021-08-19: 8 mg via INTRAVENOUS
  Filled 2021-08-19: qty 1

## 2021-08-19 MED ORDER — THROMBIN 20000 UNITS EX SOLR: CUTANEOUS | Status: DC | PRN

## 2021-08-19 MED ORDER — DOCUSATE SODIUM 100 MG PO CAPS
100.0000 mg | ORAL_CAPSULE | Freq: Two times a day (BID) | ORAL | Status: DC
  Administered 2021-08-19 – 2021-08-23 (×8): 100 mg via ORAL
  Filled 2021-08-19 (×8): qty 1

## 2021-08-19 MED ORDER — ACETAMINOPHEN 325 MG PO TABS
650.0000 mg | ORAL_TABLET | ORAL | Status: DC | PRN
  Administered 2021-08-23: 650 mg via ORAL
  Filled 2021-08-19: qty 2

## 2021-08-19 MED ORDER — LIDOCAINE 2% (20 MG/ML) 5 ML SYRINGE
INTRAMUSCULAR | Status: AC
  Filled 2021-08-19: qty 5

## 2021-08-19 MED ORDER — BUPIVACAINE LIPOSOME 1.3 % IJ SUSP
INTRAMUSCULAR | Status: DC | PRN
  Administered 2021-08-19: 20 mL

## 2021-08-19 MED ORDER — PHENYLEPHRINE 40 MCG/ML (10ML) SYRINGE FOR IV PUSH (FOR BLOOD PRESSURE SUPPORT)
PREFILLED_SYRINGE | INTRAVENOUS | Status: AC
  Filled 2021-08-19: qty 10

## 2021-08-19 MED ORDER — ACETAMINOPHEN 500 MG PO TABS
1000.0000 mg | ORAL_TABLET | Freq: Four times a day (QID) | ORAL | Status: AC
  Administered 2021-08-19 – 2021-08-20 (×4): 1000 mg via ORAL
  Filled 2021-08-19 (×4): qty 2

## 2021-08-19 MED ORDER — CYCLOBENZAPRINE HCL 10 MG PO TABS: 10.0000 mg | ORAL_TABLET | Freq: Three times a day (TID) | ORAL | Status: DC | PRN

## 2021-08-19 MED ORDER — ESMOLOL HCL 100 MG/10ML IV SOLN
INTRAVENOUS | Status: AC
  Filled 2021-08-19: qty 10

## 2021-08-19 MED ORDER — AMLODIPINE BESYLATE 10 MG PO TABS
10.0000 mg | ORAL_TABLET | Freq: Every day | ORAL | Status: DC
  Administered 2021-08-19 – 2021-08-23 (×5): 10 mg via ORAL
  Filled 2021-08-19 (×5): qty 1

## 2021-08-19 SURGICAL SUPPLY — 55 items
APL SKNCLS STERI-STRIP NONHPOA (GAUZE/BANDAGES/DRESSINGS) ×1
BAG COUNTER SPONGE SURGICOUNT (BAG) ×2 IMPLANT
BAG SPNG CNTER NS LX DISP (BAG) ×1
BENZOIN TINCTURE PRP APPL 2/3 (GAUZE/BANDAGES/DRESSINGS) ×2 IMPLANT
BIT DRILL NEURO 2X3.1 SFT TUCH (MISCELLANEOUS) IMPLANT
BUR MATCHSTICK NEURO 3.0 LAGG (BURR) ×2 IMPLANT
BUR PRECISION FLUTE 6.0 (BURR) ×1 IMPLANT
CANISTER SUCT 3000ML PPV (MISCELLANEOUS) ×2 IMPLANT
CAP LOCK DLX THRD (Cap) ×4 IMPLANT
CARTRIDGE OIL MAESTRO DRILL (MISCELLANEOUS) ×1 IMPLANT
DIFFUSER DRILL AIR PNEUMATIC (MISCELLANEOUS) ×2 IMPLANT
DRAPE C-ARM 42X72 X-RAY (DRAPES) ×2 IMPLANT
DRAPE LAPAROTOMY 100X72X124 (DRAPES) ×1 IMPLANT
DRILL NEURO 2X3.1 SOFT TOUCH (MISCELLANEOUS) ×2
DRSG OPSITE 4X5.5 SM (GAUZE/BANDAGES/DRESSINGS) ×1 IMPLANT
DRSG OPSITE POSTOP 4X6 (GAUZE/BANDAGES/DRESSINGS) ×1 IMPLANT
ELECT REM PT RETURN 9FT ADLT (ELECTROSURGICAL) ×2
ELECTRODE REM PT RTRN 9FT ADLT (ELECTROSURGICAL) ×1 IMPLANT
GAUZE 4X4 16PLY ~~LOC~~+RFID DBL (SPONGE) ×1 IMPLANT
GLOVE SS BIOGEL STRL SZ 6.5 (GLOVE) IMPLANT
GLOVE SUPERSENSE BIOGEL SZ 6.5 (GLOVE) ×2
GLOVE SURG ENC MOIS LTX SZ8 (GLOVE) ×2 IMPLANT
GLOVE SURG ENC MOIS LTX SZ8.5 (GLOVE) ×2 IMPLANT
GLOVE SURG SYN 6.5 ES PF (GLOVE) ×4 IMPLANT
GLOVE SURG SYN 6.5 PF PI (GLOVE) IMPLANT
GOWN STRL REUS W/ TWL LRG LVL3 (GOWN DISPOSABLE) IMPLANT
GOWN STRL REUS W/ TWL XL LVL3 (GOWN DISPOSABLE) IMPLANT
GOWN STRL REUS W/TWL 2XL LVL3 (GOWN DISPOSABLE) ×2 IMPLANT
GOWN STRL REUS W/TWL LRG LVL3 (GOWN DISPOSABLE) ×4
GOWN STRL REUS W/TWL XL LVL3 (GOWN DISPOSABLE) ×4
KIT BASIN OR (CUSTOM PROCEDURE TRAY) ×2 IMPLANT
KIT POSITION SURG JACKSON T1 (MISCELLANEOUS) ×1 IMPLANT
KIT TURNOVER KIT B (KITS) ×2 IMPLANT
NDL HYPO 21X1.5 SAFETY (NEEDLE) IMPLANT
NEEDLE HYPO 21X1.5 SAFETY (NEEDLE) IMPLANT
NEEDLE HYPO 22GX1.5 SAFETY (NEEDLE) ×2 IMPLANT
NS IRRIG 1000ML POUR BTL (IV SOLUTION) ×2 IMPLANT
OIL CARTRIDGE MAESTRO DRILL (MISCELLANEOUS) ×2
PACK LAMINECTOMY NEURO (CUSTOM PROCEDURE TRAY) ×2 IMPLANT
PUTTY DBM 5CC CALC GRAN (Putty) ×1 IMPLANT
ROD STRT CREO 6.35X65 (Rod) ×2 IMPLANT
SCREW PA DLX CREO 5.5X40 (Screw) ×4 IMPLANT
SET CARTRIDGE AND TUBING (SET/KITS/TRAYS/PACK) ×1 IMPLANT
SPONGE T-LAP 4X18 ~~LOC~~+RFID (SPONGE) ×2 IMPLANT
STAPLER SKIN PROX WIDE 3.9 (STAPLE) ×2 IMPLANT
STRIP CLOSURE SKIN 1/2X4 (GAUZE/BANDAGES/DRESSINGS) ×2 IMPLANT
SUT VIC AB 1 CT1 18XBRD ANBCTR (SUTURE) ×1 IMPLANT
SUT VIC AB 1 CT1 8-18 (SUTURE) ×2
SUT VIC AB 2-0 CP2 18 (SUTURE) ×2 IMPLANT
TIP SHEAR CVD EXTENDED 36KH (INSTRUMENTS) ×1 IMPLANT
TOWEL GREEN STERILE (TOWEL DISPOSABLE) ×2 IMPLANT
TOWEL GREEN STERILE FF (TOWEL DISPOSABLE) ×2 IMPLANT
TRAY FOLEY MTR SLVR 16FR STAT (SET/KITS/TRAYS/PACK) IMPLANT
WATER STERILE IRR 1000ML POUR (IV SOLUTION) ×2 IMPLANT
WRENCH TORQUE 36KHZ (INSTRUMENTS) ×1 IMPLANT
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
## 2021-08-19 NOTE — Progress Notes (Signed)
Subjective: The patient is alert and pleasant.  He denies pain.  He is appropriately sore.  Objective: Vital signs in last 24 hours: Temp:  [97.3 F (36.3 C)-98.6 F (37 C)] 97.3 F (36.3 C) (01/08 1230) Pulse Rate:  [65-98] 89 (01/08 1330) Resp:  [12-20] 12 (01/08 1330) BP: (113-163)/(70-109) 119/77 (01/08 1330) SpO2:  [93 %-100 %] 96 % (01/08 1330) Arterial Line BP: (147-153)/(71-80) 147/79 (01/08 1315) Weight:  [84.8 kg] 84.8 kg (01/07 1945) Estimated body mass index is 29.29 kg/m as calculated from the following:   Height as of this encounter: 5\' 7"  (1.702 m).   Weight as of this encounter: 84.8 kg.   Intake/Output from previous day: 01/07 0701 - 01/08 0700 In: -  Out: 1900 [Urine:1900] Intake/Output this shift: Total I/O In: 1610 [I.V.:1600; IV Piggyback:10] Out: 500 [Urine:350; Blood:150]  Physical exam the patient is alert and pleasant.  His right quadricep strength is 1/5, his right dorsiflexor strength is approximately 3/5, his left quadricep strength is 3 to 4-/5, his left gastrocnemius is 3-4 over 5, his left dorsiflexor strength is approximately 3/5.  Lab Results: Recent Labs    08/18/21 2355 08/19/21 0932  WBC 10.8*  --   HGB 15.6 13.3  HCT 46.8 39.0  PLT 221  --    BMET Recent Labs    08/18/21 2355 08/19/21 0932  NA 135 133*  K 3.9 4.4  CL 96*  --   CO2 26  --   GLUCOSE 134*  --   BUN 9  --   CREATININE 0.91  --   CALCIUM 9.7  --     Studies/Results: CT ABDOMEN PELVIS WO CONTRAST  Result Date: 08/19/2021 CLINICAL DATA:  Abdominal pain EXAM: CT ABDOMEN AND PELVIS WITHOUT CONTRAST TECHNIQUE: Multidetector CT imaging of the abdomen and pelvis was performed following the standard protocol without IV contrast. COMPARISON:  04/15/2012 FINDINGS: Lower chest: No acute abnormality. Hepatobiliary: No focal hepatic abnormality. Gallbladder unremarkable. Pancreas: No focal abnormality or ductal dilatation. Spleen: No focal abnormality.  Normal size.  Adrenals/Urinary Tract: No adrenal abnormality. No focal renal abnormality. No stones or hydronephrosis. Urinary bladder is unremarkable. Stomach/Bowel: Moderate stool burden throughout the colon. Normal appendix. Stomach, large and small bowel grossly unremarkable. Vascular/Lymphatic: Aortic atherosclerosis. No aneurysm. Probable enlarged left external iliac chain lymph node on image 67 of series 3 with a short axis diameter of 3 cm. No additional adenopathy. Reproductive: No visible focal abnormality. Other: No free fluid or free air. Musculoskeletal: Sclerotic lesion throughout the right iliac bone. Rounded sclerotic lesion in the left iliac bone. These are new since prior study. Mixed sclerotic and lucent destructive lesion noted superiorly in the right iliac bone. Mixed lucent and sclerotic lesions within the L1 and L3 vertebral body. These are new since prior study. Predominantly lytic lesion within the inferior right femoral head. IMPRESSION: Mixed lytic and sclerotic lesions seen within the L2, L4 vertebral bodies and right iliac bone with lytic destructive lesion in the superior aspect of the right iliac bone near the right SI joint. Rounded sclerotic lesion within the left iliac bone. Predominantly lytic lesion within the inferior right femoral head. Appearance is concerning for possible metastatic disease. Recommend clinical correlation for cancer history. These could be further evaluated with nuclear medicine bone scan. Left external iliac adenopathy could also reflect metastatic disease or lymphoma. Aortic atherosclerosis. Moderate stool burden throughout the colon. Electronically Signed   By: Rolm Baptise M.D.   On: 08/19/2021 00:53   DG Abdomen  1 View  Result Date: 08/18/2021 CLINICAL DATA:  Constipation. EXAM: ABDOMEN - 1 VIEW COMPARISON:  CT abdomen pelvis dated 04/15/2012. FINDINGS: Moderate stool throughout the colon. No bowel dilatation or evidence of obstruction. No free air or radiopaque  calculi. The osseous structures are intact. The soft tissues are unremarkable. IMPRESSION: Constipation. No bowel obstruction. Electronically Signed   By: Anner Crete M.D.   On: 08/18/2021 20:28   CT Chest W Contrast  Result Date: 08/19/2021 CLINICAL DATA:  Occult malignancy. EXAM: CT CHEST WITH CONTRAST TECHNIQUE: Multidetector CT imaging of the chest was performed during intravenous contrast administration. CONTRAST:  3mL OMNIPAQUE IOHEXOL 300 MG/ML  SOLN COMPARISON:  None. FINDINGS: Cardiovascular: The heart is normal in size and there is no pericardial effusion. Scattered coronary artery calcifications are noted. There is minimal atherosclerotic calcification of the aorta without evidence of aneurysm. The pulmonary trunk is normal in caliber. Mediastinum/Nodes: No enlarged mediastinal, hilar, or axillary lymph nodes. Thyroid gland, trachea, and esophagus demonstrate no significant findings. Lungs/Pleura: There is a nodule in the left upper lobe measuring 4 mm, axial image 70. No effusion or pneumothorax. Upper Abdomen: A 1.2 cm hypodensity is noted in the caudate lobe of the liver, possible cyst. Musculoskeletal: There is an expansile sclerotic mass in the T3 rib on the right with associated soft tissue thickening. An old rib fracture is present at T7 on the left. Mixed lytic and sclerotic lesions are seen at T3, T4, T6, and L1. IMPRESSION: 1. Mixed lytic and sclerotic lesions in the T3, T4, T6, and L1 vertebral bodies and expansile sclerotic lesion with associated soft tissue thickening in the T3 rim on the right, suspicious for neoplastic process, possible metastatic disease. Biopsy may be beneficial for definitive diagnosis. 2. Hypodensity in the caudate lobe of the liver, possible cyst. 3. Scattered coronary artery calcifications. 4. Aortic atherosclerosis. Electronically Signed   By: Brett Fairy M.D.   On: 08/19/2021 03:23   MR THORACIC SPINE W WO CONTRAST  Addendum Date: 08/19/2021   ADDENDUM  REPORT: 08/19/2021 05:55 ADDENDUM: Salient findings discussed by telephone with Dr. Roxanne Mins in the Emergency Department at 0551 hours. Electronically Signed   By: Genevie Ann M.D.   On: 08/19/2021 05:55   Result Date: 08/19/2021 CLINICAL DATA:  61 year old male with abdominal pain, evidence of osseous metastatic disease on CT Chest, Abdomen, and Pelvis today, including multiple lytic spine lesions. EXAM: MRI THORACIC AND LUMBAR SPINE WITHOUT AND WITH CONTRAST TECHNIQUE: Multiplanar and multiecho pulse sequences of the thoracic and lumbar spine were obtained without and with intravenous contrast. CONTRAST:  13mL GADAVIST GADOBUTROL 1 MMOL/ML IV SOLN COMPARISON:  CT Chest, Abdomen, and Pelvis 0031 hours and 0303 hours today. FINDINGS: MRI THORACIC SPINE FINDINGS Limited cervical spine imaging:  Grossly negative. Thoracic spine segmentation:  Normal on the comparison. Alignment:  Maintained thoracic kyphosis. Vertebrae: Enhancing STIR hyperintense tumor in the right T1 facets, replacing the T3 vertebral body (additional details below, in the superior T4 vertebral body, and subtotally replacing the T6 vertebra (details below). No definite posterior rib metastasis. Cord: Moderate to severe cord compression at the T6 level (series 18, image 18). See further detailed below. Mild associated cord edema. Above and below that level spinal cord signal and morphology within normal limits. There is mild dural thickening and enhancement there (series 32, image 18), but no convincing intradural enhancement, no abnormal dural thickening or enhancement elsewhere. Conus medullaris appears normal at T12-L1. Paraspinal and other soft tissues: Early ventral and lateral paraspinal extension  of tumor suspected at T6. See additional details below. And there might also be early extraosseous extension of tumor from the right T1 facets (series 7, image 2). Other thoracic and abdominal viscera as reported by CT earlier today. Disc levels: T3: Mild  superior endplate pathologic compression fracture stable from the earlier CT. Early ventral epidural tumor (series 15, image 9) but no cord compression. No foraminal involvement. T5-T6: Bulky up to 25 mm long axis epidural tumor occupies the right spinal canal here extending to just above the T5-T6 disc space (series 15, image 7) with underlying subtotal T6 vertebral body tumor replacement including posterior elements on the right side. The partially cystic or necrotic epidural mass results in moderate to severe compression of the spinal cord into the anterior left canal (series 18, image 18) plus some cord edema as above. There is also subtotal occlusion of the right T6 neural foramen, and mild stenosis of the right T5 foramen. Additional mild T6 pathologic inferior endplate compression was better demonstrated by CT today. No other thoracic epidural tumor or spinal stenosis. MRI LUMBAR SPINE FINDINGS Segmentation:  Normal, concordant with the numbering above. Alignment:  Preserved lumbar lordosis. Vertebrae: Subtotal replacement of the L1 and L3 vertebral bodies by STIR hyperintense heterogeneously enhancing tumor. Mild pathologic inferior compression at both levels as seen by CT today. Posterior elements of both levels appear spared. Large infiltrating tumor of the right iliac bone with posterior extraosseous extension (series 31, image 31 and series 26, image 1) abuts the right SI joint. Smaller round left medial iliac bone metastasis. Visible sacrum remains intact. Conus medullaris: Extends to the T12-L1 level. No lower spinal cord or conus signal abnormality. No abnormal intradural enhancement. Normal cauda equina nerve roots. No lumbosacral dural thickening. Paraspinal and other soft tissues: Severe bladder distension. Oval metastatic left iliac lymph node up to 3.8 cm diameter (series 31, image 38). Other abdominal and pelvic viscera as on CT today. Disc levels: No lumbar epidural or foraminal tumor. Disc and  endplate degeneration maximal at L3-L4 with mild degenerative foraminal stenosis at that level. IMPRESSION: 1. 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 Vertebral tumor with mild pathologic compression fractures of T3, T6, L1, and L3. 2. 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 Associated near complete occlusion of the right T6 neural foramen. And early extraosseous tumor extension suspected from the right T1 facet. 3. No other thoracic epidural tumor or spinal stenosis. 4. Severe bladder distension. 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 5. 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 Electronically Signed: By: Genevie Ann M.D. On: 08/19/2021 05:48   MR Lumbar Spine W Wo Contrast  Addendum Date: 08/19/2021   ADDENDUM REPORT: 08/19/2021 05:55 ADDENDUM: Salient findings discussed by telephone with Dr. Roxanne Mins in the Emergency Department at 0551 hours. Electronically Signed   By: Genevie Ann M.D.   On: 08/19/2021 05:55   Result Date: 08/19/2021 CLINICAL DATA:  61 year old male with abdominal pain, evidence of osseous metastatic disease on CT Chest, Abdomen, and Pelvis today, including multiple lytic spine lesions. EXAM: MRI THORACIC AND LUMBAR SPINE WITHOUT AND WITH CONTRAST TECHNIQUE: Multiplanar and multiecho pulse sequences of the thoracic and lumbar spine were obtained without and with intravenous contrast. CONTRAST:  66mL GADAVIST GADOBUTROL 1 MMOL/ML IV SOLN COMPARISON:  CT Chest, Abdomen, and Pelvis 0031 hours and  0303 hours today. FINDINGS: MRI THORACIC SPINE FINDINGS Limited cervical spine imaging:  Grossly negative. Thoracic spine segmentation:  Normal on the comparison. Alignment:  Maintained thoracic kyphosis. Vertebrae: Enhancing STIR hyperintense tumor in the right T1 facets, replacing the T3 vertebral body  (additional details below, in the superior T4 vertebral body, and subtotally replacing the T6 vertebra (details below). No definite posterior rib metastasis. Cord: Moderate to severe cord compression at the T6 level (series 18, image 18). See further detailed below. Mild associated cord edema. Above and below that level spinal cord signal and morphology within normal limits. There is mild dural thickening and enhancement there (series 32, image 18), but no convincing intradural enhancement, no abnormal dural thickening or enhancement elsewhere. Conus medullaris appears normal at T12-L1. Paraspinal and other soft tissues: Early ventral and lateral paraspinal extension of tumor suspected at T6. See additional details below. And there might also be early extraosseous extension of tumor from the right T1 facets (series 7, image 2). Other thoracic and abdominal viscera as reported by CT earlier today. Disc levels: T3: Mild superior endplate pathologic compression fracture stable from the earlier CT. Early ventral epidural tumor (series 15, image 9) but no cord compression. No foraminal involvement. T5-T6: Bulky up to 25 mm long axis epidural tumor occupies the right spinal canal here extending to just above the T5-T6 disc space (series 15, image 7) with underlying subtotal T6 vertebral body tumor replacement including posterior elements on the right side. The partially cystic or necrotic epidural mass results in moderate to severe compression of the spinal cord into the anterior left canal (series 18, image 18) plus some cord edema as above. There is also subtotal occlusion of the right T6 neural foramen, and mild stenosis of the right T5 foramen. Additional mild T6 pathologic inferior endplate compression was better demonstrated by CT today. No other thoracic epidural tumor or spinal stenosis. MRI LUMBAR SPINE FINDINGS Segmentation:  Normal, concordant with the numbering above. Alignment:  Preserved lumbar lordosis.  Vertebrae: Subtotal replacement of the L1 and L3 vertebral bodies by STIR hyperintense heterogeneously enhancing tumor. Mild pathologic inferior compression at both levels as seen by CT today. Posterior elements of both levels appear spared. Large infiltrating tumor of the right iliac bone with posterior extraosseous extension (series 31, image 31 and series 26, image 1) abuts the right SI joint. Smaller round left medial iliac bone metastasis. Visible sacrum remains intact. Conus medullaris: Extends to the T12-L1 level. No lower spinal cord or conus signal abnormality. No abnormal intradural enhancement. Normal cauda equina nerve roots. No lumbosacral dural thickening. Paraspinal and other soft tissues: Severe bladder distension. Oval metastatic left iliac lymph node up to 3.8 cm diameter (series 31, image 38). Other abdominal and pelvic viscera as on CT today. Disc levels: No lumbar epidural or foraminal tumor. Disc and endplate degeneration maximal at L3-L4 with mild degenerative foraminal stenosis at that level. IMPRESSION: 1. 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 Vertebral tumor with mild pathologic compression fractures of T3, T6, L1, and L3. 2. 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 Associated near complete occlusion of the right T6 neural foramen. And early extraosseous tumor extension suspected from the right T1 facet. 3. No other thoracic epidural tumor or spinal stenosis. 4. Severe bladder distension. 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 5. Bulky right posterior iliac  wing tumor and extraosseous extension. Electronically Signed: By: Genevie Ann M.D. On: 08/19/2021 05:48   DG C-Arm 1-60 Min-No Report  Result Date: 08/19/2021 Fluoroscopy was utilized by the requesting physician.  No radiographic interpretation.   DG C-Arm  1-60 Min-No Report  Result Date: 08/19/2021 Fluoroscopy was utilized by the requesting physician.  No radiographic interpretation.   DG C-Arm 1-60 Min-No Report  Result Date: 08/19/2021 Fluoroscopy was utilized by the requesting physician.  No radiographic interpretation.    Assessment/Plan: Status post thoracic laminectomy instrumentation and fusion for tumor: The patient strength is better than it was preop.  I think it will continue to improve with time and rehab.  I have spoken to the patient and his wife.  LOS: 0 days     Ophelia Charter 08/19/2021, 2:18 PM

## 2021-08-19 NOTE — Anesthesia Preprocedure Evaluation (Signed)
Anesthesia Evaluation  Patient identified by MRN, date of birth, ID band Patient awake    Reviewed: Allergy & Precautions, H&P , NPO status , Patient's Chart, lab work & pertinent test results  Airway Mallampati: II  TM Distance: >3 FB Neck ROM: Full    Dental no notable dental hx. (+) Teeth Intact, Dental Advisory Given   Pulmonary neg pulmonary ROS,    Pulmonary exam normal breath sounds clear to auscultation       Cardiovascular hypertension, Pt. on medications  Rhythm:Regular Rate:Normal     Neuro/Psych negative neurological ROS  negative psych ROS   GI/Hepatic negative GI ROS, Neg liver ROS,   Endo/Other  negative endocrine ROS  Renal/GU negative Renal ROS  negative genitourinary   Musculoskeletal   Abdominal   Peds  Hematology negative hematology ROS (+)   Anesthesia Other Findings   Reproductive/Obstetrics negative OB ROS                             Anesthesia Physical Anesthesia Plan  ASA: 2  Anesthesia Plan: General   Post-op Pain Management: Ofirmev IV (intra-op)   Induction: Intravenous  PONV Risk Score and Plan: 3 and Ondansetron, Dexamethasone and Midazolam  Airway Management Planned: Oral ETT  Additional Equipment: Arterial line  Intra-op Plan:   Post-operative Plan: Extubation in OR  Informed Consent: I have reviewed the patients History and Physical, chart, labs and discussed the procedure including the risks, benefits and alternatives for the proposed anesthesia with the patient or authorized representative who has indicated his/her understanding and acceptance.     Dental advisory given  Plan Discussed with: CRNA  Anesthesia Plan Comments:         Anesthesia Quick Evaluation

## 2021-08-19 NOTE — ED Provider Notes (Signed)
[facility] EMERGENCY DEPARTMENT Provider Note   CSN: [id number] Arrival date & time: 08/18/21  1925     History  Chief Complaint  Patient presents with   Constipation   Back Pain   Extremity Weakness    Clifford Jenkins is a 61 y.o. male.  HPI  Patient is a 62 year old male who presents to the emergency department for reevaluation of low back pain.  Patient initially seen on January 3 for low back pain that been ongoing for the past 2 to 3 weeks.  He works at [employer] and has to bend over to pick up heavy objects frequently throughout the day.  He has been evaluated in urgent care and prescribed muscle relaxers, NSAIDs, as well as steroids.  While in the ED he was treated with Robaxin, Decadron as well as ibuprofen and discharged in stable condition.  He states that he is continue taking Robaxin since being discharged.  He states that his pain is persisted and he now has developed constipation, abdominal pain as well as difficulty with urination.  Patient states that he has not had a bowel movement in 3 days.  He states he is still urinating but has more difficulty initiating urination.  He states that earlier today he began experiencing lower extremity weakness, right greater than left and now has difficulty with ambulation.  His wife states that he appears to be dragging his right leg.  Denies any falls or trauma.  Denies any numbness, weakness, headaches, visual changes, chest pain, shortness of breath, night sweats, weight loss, IVDA.     Home Medications Prior to Admission medications   Medication Sig Start Date End Date Taking? Authorizing Provider  amLODipine (NORVASC) 10 MG tablet Take 10 mg by mouth daily. 05/12/19  Yes [provider]  ibuprofen (ADVIL) 200 MG tablet Take 600 mg by mouth every 6 (six) hours as needed for moderate pain.   Yes [provider]  methocarbamol (ROBAXIN) 500 MG tablet Take 1 tablet (500 mg total) by mouth 2 (two) times  daily. 08/14/21  Yes Azucena Cecil, PA-C  valsartan (DIOVAN) 160 MG tablet Take 160 mg by mouth daily. 05/12/19  Yes [provider]      Allergies    Patient has no known allergies.    Review of Systems   Review of Systems  All other systems reviewed and are negative. Ten systems reviewed and are negative for acute change, except as noted in the HPI.   Physical Exam Updated Vital Signs BP (!) 151/90    Pulse 81    Temp 98.4 F (36.9 C)    Resp 15    Ht 5\' 7"  (1.702 m)    Wt 84.8 kg    SpO2 97%    BMI 29.29 kg/m  Physical Exam Vitals and nursing note reviewed.  Constitutional:      General: He is not in acute distress.    Appearance: Normal appearance. He is not ill-appearing, toxic-appearing or diaphoretic.  HENT:     Head: Normocephalic and atraumatic.     Right Ear: External ear normal.     Left Ear: External ear normal.     Nose: Nose normal.     Mouth/Throat:     Mouth: Mucous membranes are moist.     Pharynx: Oropharynx is clear. No oropharyngeal exudate or posterior oropharyngeal erythema.  Eyes:     General: No scleral icterus.       Right eye: No discharge.  Left eye: No discharge.     Extraocular Movements: Extraocular movements intact.     Conjunctiva/sclera: Conjunctivae normal.     Pupils: Pupils are equal, round, and reactive to light.  Cardiovascular:     Rate and Rhythm: Normal rate and regular rhythm.     Pulses: Normal pulses.     Heart sounds: Normal heart sounds. No murmur heard.   No friction rub. No gallop.  Pulmonary:     Effort: Pulmonary effort is normal. No respiratory distress.     Breath sounds: Normal breath sounds. No stridor. No wheezing, rhonchi or rales.  Abdominal:     General: Abdomen is flat.     Palpations: Abdomen is soft.     Tenderness: There is no abdominal tenderness.     Comments: Protuberant abdomen that is soft.  No tenderness appreciated in all 4 quadrants.  Genitourinary:    Comments: Normal-appearing  anal region.  Mildly diminished rectal tone. Musculoskeletal:        General: Tenderness present. Normal range of motion.     Cervical back: Normal range of motion and neck supple. No tenderness.     Comments: Mild TTP noted in the left lumbar region.  No midline spine pain.  No step-offs, crepitus, or deformities.  Skin:    General: Skin is warm and dry.  Neurological:     General: No focal deficit present.     Mental Status: He is alert and oriented to person, place, and time.     Comments: Strength appears to be 5/5 in the left leg with plantar flexion, dorsiflexion, as well as resisted flexion and extension at the knee.  Strength appears to be 4/5 in the right leg with plantar flexion, dorsiflexion as well as resisted flexion and extension at the knee.  Distal sensation intact.  2+ pedal pulses.  Patient unable to stand and ambulate without assistance and nearly falls without assistance.  Strength is 5/5 in the bilateral upper extremities.  Distal sensation intact.  2+ radial pulses.  Psychiatric:        Mood and Affect: Mood normal.        Behavior: Behavior normal.   ED Results / Procedures / Treatments   Labs (all labs ordered are listed, but only abnormal results are displayed) Labs Reviewed  CBC WITH DIFFERENTIAL/PLATELET - Abnormal; Notable for the following components:      Result Value   WBC 10.8 (*)    Neutro Abs 8.0 (*)    Monocytes Absolute 1.1 (*)    Abs Immature Granulocytes 0.11 (*)    All other components within normal limits  BASIC METABOLIC PANEL - Abnormal; Notable for the following components:   Chloride 96 (*)    Glucose, Bld 134 (*)    All other components within normal limits  URINALYSIS, COMPLETE (UACMP) WITH MICROSCOPIC - Abnormal; Notable for the following components:   Hgb urine dipstick TRACE (*)    All other components within normal limits  RESP PANEL BY RT-PCR (FLU A&B, COVID) [id number]   EKG None  Radiology CT ABDOMEN PELVIS WO CONTRAST  Result  Date: 08/19/2021 CLINICAL DATA:  Abdominal pain EXAM: CT ABDOMEN AND PELVIS WITHOUT CONTRAST TECHNIQUE: Multidetector CT imaging of the abdomen and pelvis was performed following the standard protocol without IV contrast. COMPARISON:  04/15/2012 FINDINGS: Lower chest: No acute abnormality. Hepatobiliary: No focal hepatic abnormality. Gallbladder unremarkable. Pancreas: No focal abnormality or ductal dilatation. Spleen: No focal abnormality.  Normal size. Adrenals/Urinary Tract: No adrenal abnormality.  No focal renal abnormality. No stones or hydronephrosis. Urinary bladder is unremarkable. Stomach/Bowel: Moderate stool burden throughout the colon. Normal appendix. Stomach, large and small bowel grossly unremarkable. Vascular/Lymphatic: Aortic atherosclerosis. No aneurysm. Probable enlarged left external iliac chain lymph node on image 67 of series 3 with a short axis diameter of 3 cm. No additional adenopathy. Reproductive: No visible focal abnormality. Other: No free fluid or free air. Musculoskeletal: Sclerotic lesion throughout the right iliac bone. Rounded sclerotic lesion in the left iliac bone. These are new since prior study. Mixed sclerotic and lucent destructive lesion noted superiorly in the right iliac bone. Mixed lucent and sclerotic lesions within the L1 and L3 vertebral body. These are new since prior study. Predominantly lytic lesion within the inferior right femoral head. IMPRESSION: Mixed lytic and sclerotic lesions seen within the L2, L4 vertebral bodies and right iliac bone with lytic destructive lesion in the superior aspect of the right iliac bone near the right SI joint. Rounded sclerotic lesion within the left iliac bone. Predominantly lytic lesion within the inferior right femoral head. Appearance is concerning for possible metastatic disease. Recommend clinical correlation for cancer history. These could be further evaluated with nuclear medicine bone scan. Left external iliac adenopathy  could also reflect metastatic disease or lymphoma. Aortic atherosclerosis. Moderate stool burden throughout the colon. Electronically Signed   By: Rolm Baptise M.D.   On: 08/19/2021 00:53   DG Abdomen 1 View  Result Date: 08/18/2021 CLINICAL DATA:  Constipation. EXAM: ABDOMEN - 1 VIEW COMPARISON:  CT abdomen pelvis dated 04/15/2012. FINDINGS: Moderate stool throughout the colon. No bowel dilatation or evidence of obstruction. No free air or radiopaque calculi. The osseous structures are intact. The soft tissues are unremarkable. IMPRESSION: Constipation. No bowel obstruction. Electronically Signed   By: Anner Crete M.D.   On: 08/18/2021 20:28   CT Chest W Contrast  Result Date: 08/19/2021 CLINICAL DATA:  Occult malignancy. EXAM: CT CHEST WITH CONTRAST TECHNIQUE: Multidetector CT imaging of the chest was performed during intravenous contrast administration. CONTRAST:  37mL OMNIPAQUE IOHEXOL 300 MG/ML  SOLN COMPARISON:  None. FINDINGS: Cardiovascular: The heart is normal in size and there is no pericardial effusion. Scattered coronary artery calcifications are noted. There is minimal atherosclerotic calcification of the aorta without evidence of aneurysm. The pulmonary trunk is normal in caliber. Mediastinum/Nodes: No enlarged mediastinal, hilar, or axillary lymph nodes. Thyroid gland, trachea, and esophagus demonstrate no significant findings. Lungs/Pleura: There is a nodule in the left upper lobe measuring 4 mm, axial image 70. No effusion or pneumothorax. Upper Abdomen: A 1.2 cm hypodensity is noted in the caudate lobe of the liver, possible cyst. Musculoskeletal: There is an expansile sclerotic mass in the T3 rib on the right with associated soft tissue thickening. An old rib fracture is present at T7 on the left. Mixed lytic and sclerotic lesions are seen at T3, T4, T6, and L1. IMPRESSION: 1. Mixed lytic and sclerotic lesions in the T3, T4, T6, and L1 vertebral bodies and expansile sclerotic lesion  with associated soft tissue thickening in the T3 rim on the right, suspicious for neoplastic process, possible metastatic disease. Biopsy may be beneficial for definitive diagnosis. 2. Hypodensity in the caudate lobe of the liver, possible cyst. 3. Scattered coronary artery calcifications. 4. Aortic atherosclerosis. Electronically Signed   By: Brett Fairy M.D.   On: 08/19/2021 03:23   MR THORACIC SPINE W WO CONTRAST  Addendum Date: 08/19/2021   ADDENDUM REPORT: 08/19/2021 05:55 ADDENDUM: Salient findings discussed by  telephone with Dr. Roxanne Mins in the Emergency Department at 0551 hours. Electronically Signed   By: Genevie Ann M.D.   On: 08/19/2021 05:55   Result Date: 08/19/2021 CLINICAL DATA:  61 year old male with abdominal pain, evidence of osseous metastatic disease on CT Chest, Abdomen, and Pelvis today, including multiple lytic spine lesions. EXAM: MRI THORACIC AND LUMBAR SPINE WITHOUT AND WITH CONTRAST TECHNIQUE: Multiplanar and multiecho pulse sequences of the thoracic and lumbar spine were obtained without and with intravenous contrast. CONTRAST:  43mL GADAVIST GADOBUTROL 1 MMOL/ML IV SOLN COMPARISON:  CT Chest, Abdomen, and Pelvis 0031 hours and 0303 hours today. FINDINGS: MRI THORACIC SPINE FINDINGS Limited cervical spine imaging:  Grossly negative. Thoracic spine segmentation:  Normal on the comparison. Alignment:  Maintained thoracic kyphosis. Vertebrae: Enhancing STIR hyperintense tumor in the right T1 facets, replacing the T3 vertebral body (additional details below, in the superior T4 vertebral body, and subtotally replacing the T6 vertebra (details below). No definite posterior rib metastasis. Cord: Moderate to severe cord compression at the T6 level (series 18, image 18). See further detailed below. Mild associated cord edema. Above and below that level spinal cord signal and morphology within normal limits. There is mild dural thickening and enhancement there (series 32, image 18), but no convincing  intradural enhancement, no abnormal dural thickening or enhancement elsewhere. Conus medullaris appears normal at T12-L1. Paraspinal and other soft tissues: Early ventral and lateral paraspinal extension of tumor suspected at T6. See additional details below. And there might also be early extraosseous extension of tumor from the right T1 facets (series 7, image 2). Other thoracic and abdominal viscera as reported by CT earlier today. Disc levels: T3: Mild superior endplate pathologic compression fracture stable from the earlier CT. Early ventral epidural tumor (series 15, image 9) but no cord compression. No foraminal involvement. T5-T6: Bulky up to 25 mm long axis epidural tumor occupies the right spinal canal here extending to just above the T5-T6 disc space (series 15, image 7) with underlying subtotal T6 vertebral body tumor replacement including posterior elements on the right side. The partially cystic or necrotic epidural mass results in moderate to severe compression of the spinal cord into the anterior left canal (series 18, image 18) plus some cord edema as above. There is also subtotal occlusion of the right T6 neural foramen, and mild stenosis of the right T5 foramen. Additional mild T6 pathologic inferior endplate compression was better demonstrated by CT today. No other thoracic epidural tumor or spinal stenosis. MRI LUMBAR SPINE FINDINGS Segmentation:  Normal, concordant with the numbering above. Alignment:  Preserved lumbar lordosis. Vertebrae: Subtotal replacement of the L1 and L3 vertebral bodies by STIR hyperintense heterogeneously enhancing tumor. Mild pathologic inferior compression at both levels as seen by CT today. Posterior elements of both levels appear spared. Large infiltrating tumor of the right iliac bone with posterior extraosseous extension (series 31, image 31 and series 26, image 1) abuts the right SI joint. Smaller round left medial iliac bone metastasis. Visible sacrum remains  intact. Conus medullaris: Extends to the T12-L1 level. No lower spinal cord or conus signal abnormality. No abnormal intradural enhancement. Normal cauda equina nerve roots. No lumbosacral dural thickening. Paraspinal and other soft tissues: Severe bladder distension. Oval metastatic left iliac lymph node up to 3.8 cm diameter (series 31, image 38). Other abdominal and pelvic viscera as on CT today. Disc levels: No lumbar epidural or foraminal tumor. Disc and endplate degeneration maximal at L3-L4 with mild degenerative foraminal stenosis at that  level. IMPRESSION: 1. 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 Vertebral tumor with mild pathologic compression fractures of T3, T6, L1, and L3. 2. 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 Associated near complete occlusion of the right T6 neural foramen. And early extraosseous tumor extension suspected from the right T1 facet. 3. No other thoracic epidural tumor or spinal stenosis. 4. Severe bladder distension. 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 5. 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 Electronically Signed: By: Genevie Ann M.D. On: 08/19/2021 05:48   MR Lumbar Spine W Wo Contrast  Addendum Date: 08/19/2021   ADDENDUM REPORT: 08/19/2021 05:55 ADDENDUM: Salient findings discussed by telephone with Dr. Roxanne Mins in the Emergency Department at 0551 hours. Electronically Signed   By: Genevie Ann M.D.   On: 08/19/2021 05:55   Result Date: 08/19/2021 CLINICAL DATA:  61 year old male with abdominal pain, evidence of osseous metastatic disease on CT Chest, Abdomen, and Pelvis today, including multiple lytic spine lesions. EXAM: MRI THORACIC AND LUMBAR SPINE WITHOUT AND WITH CONTRAST TECHNIQUE: Multiplanar and multiecho pulse sequences of the thoracic and lumbar spine were  obtained without and with intravenous contrast. CONTRAST:  64mL GADAVIST GADOBUTROL 1 MMOL/ML IV SOLN COMPARISON:  CT Chest, Abdomen, and Pelvis 0031 hours and 0303 hours today. FINDINGS: MRI THORACIC SPINE FINDINGS Limited cervical spine imaging:  Grossly negative. Thoracic spine segmentation:  Normal on the comparison. Alignment:  Maintained thoracic kyphosis. Vertebrae: Enhancing STIR hyperintense tumor in the right T1 facets, replacing the T3 vertebral body (additional details below, in the superior T4 vertebral body, and subtotally replacing the T6 vertebra (details below). No definite posterior rib metastasis. Cord: Moderate to severe cord compression at the T6 level (series 18, image 18). See further detailed below. Mild associated cord edema. Above and below that level spinal cord signal and morphology within normal limits. There is mild dural thickening and enhancement there (series 32, image 18), but no convincing intradural enhancement, no abnormal dural thickening or enhancement elsewhere. Conus medullaris appears normal at T12-L1. Paraspinal and other soft tissues: Early ventral and lateral paraspinal extension of tumor suspected at T6. See additional details below. And there might also be early extraosseous extension of tumor from the right T1 facets (series 7, image 2). Other thoracic and abdominal viscera as reported by CT earlier today. Disc levels: T3: Mild superior endplate pathologic compression fracture stable from the earlier CT. Early ventral epidural tumor (series 15, image 9) but no cord compression. No foraminal involvement. T5-T6: Bulky up to 25 mm long axis epidural tumor occupies the right spinal canal here extending to just above the T5-T6 disc space (series 15, image 7) with underlying subtotal T6 vertebral body tumor replacement including posterior elements on the right side. The partially cystic or necrotic epidural mass results in moderate to severe compression of the spinal cord  into the anterior left canal (series 18, image 18) plus some cord edema as above. There is also subtotal occlusion of the right T6 neural foramen, and mild stenosis of the right T5 foramen. Additional mild T6 pathologic inferior endplate compression was better demonstrated by CT today. No other thoracic epidural tumor or spinal stenosis. MRI LUMBAR SPINE FINDINGS Segmentation:  Normal, concordant with the numbering above. Alignment:  Preserved lumbar lordosis. Vertebrae: Subtotal replacement of the L1 and L3 vertebral bodies by STIR hyperintense  heterogeneously enhancing tumor. Mild pathologic inferior compression at both levels as seen by CT today. Posterior elements of both levels appear spared. Large infiltrating tumor of the right iliac bone with posterior extraosseous extension (series 31, image 31 and series 26, image 1) abuts the right SI joint. Smaller round left medial iliac bone metastasis. Visible sacrum remains intact. Conus medullaris: Extends to the T12-L1 level. No lower spinal cord or conus signal abnormality. No abnormal intradural enhancement. Normal cauda equina nerve roots. No lumbosacral dural thickening. Paraspinal and other soft tissues: Severe bladder distension. Oval metastatic left iliac lymph node up to 3.8 cm diameter (series 31, image 38). Other abdominal and pelvic viscera as on CT today. Disc levels: No lumbar epidural or foraminal tumor. Disc and endplate degeneration maximal at L3-L4 with mild degenerative foraminal stenosis at that level. IMPRESSION: 1. 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 Vertebral tumor with mild pathologic compression fractures of T3, T6, L1, and L3. 2. 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 Associated near complete occlusion of the right T6 neural foramen. And early extraosseous tumor  extension suspected from the right T1 facet. 3. No other thoracic epidural tumor or spinal stenosis. 4. Severe bladder distension. 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 5. 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 Electronically Signed: By: Genevie Ann M.D. On: 08/19/2021 05:48    Procedures Procedures    Medications Ordered in ED Medications  ketorolac (TORADOL) injection 30 mg (30 mg Intramuscular Given 08/18/21 2346)  sodium chloride 0.9 % bolus 500 mL (500 mLs Intravenous New Bag/Given 08/19/21 0018)  dexamethasone (DECADRON) injection 8 mg (8 mg Intravenous Given 08/19/21 0017)  iohexol (OMNIPAQUE) 300 MG/ML solution 75 mL (75 mLs Intravenous Contrast Given 08/19/21 0310)  gadobutrol (GADAVIST) 1 MMOL/ML injection 8 mL (8 mLs Intravenous Contrast Given 08/19/21 0523)   ED Course/ Medical Decision Making/ A&P Clinical Course as of 08/19/21 0650  Sun Aug 19, 2021  0557 Imaging concerning for severe bladder distention.  In-N-Out performed draining 900 cc of urine.  We will have the nursing staff place a Foley catheter. [LJ]    Clinical Course User Index [LJ] Rayna Sexton, PA-C                           Medical Decision Making Pt is a 61 y.o. male who presents to the emergency department for reevaluation of low back pain.  Patient now endorses weakness in the lower extremities, constipation, as well as difficulty with urination.  Labs: CBC with a white count of 10.8, neutrophils of 8, monocytes of 1.1, absolute immature granulocytes of 0.11. BMP with a chloride of 96 and a glucose of 134. UA is pending.  Imaging: CT scan of the chest with contrast shows IMPRESSION: 1. Mixed lytic and sclerotic lesions in the T3, T4, T6, and L1 vertebral bodies and expansile sclerotic lesion with associated soft tissue thickening in the T3 rim on the right, suspicious for neoplastic process, possible metastatic disease. Biopsy may be beneficial for definitive diagnosis.  2. Hypodensity in the caudate lobe of the liver, possible cyst. 3. Scattered coronary artery calcifications. 4. Aortic atherosclerosis.   CT scan of the abdomen/pelvis without contrast shows IMPRESSION: Mixed lytic and sclerotic lesions seen within the L2, L4 vertebral bodies and right iliac bone  with lytic destructive lesion in the superior aspect of the right iliac bone near the right SI joint. Rounded sclerotic lesion within the left iliac bone. Predominantly lytic lesion within the inferior right femoral head. Appearance is concerning for possible metastatic disease. Recommend clinical correlation for cancer history. These could be further evaluated with nuclear medicine bone scan. Left external iliac adenopathy could also reflect metastatic disease or lymphoma. Aortic atherosclerosis. Moderate stool burden throughout the colon.   X-ray of the abdomen shows IMPRESSION: Constipation. No bowel obstruction.   MRI of the lumbar spine shows IMPRESSION: 1. 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 Vertebral tumor with mild pathologic compression fractures of T3, T6, L1, and L3. 2. 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 Associated near complete occlusion of the right T6 neural foramen. And early extraosseous tumor extension suspected from the right T1 facet. 3. No other thoracic epidural tumor or spinal stenosis. 4. Severe bladder distension. 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 5. 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   MRI of the thoracic spine shows IMPRESSION: 1. 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 Vertebral tumor with mild pathologic compression fractures of T3, T6, L1, and L3. 2. Large, 2.5 cm epidural tumor extending from the T6 vertebra occupies the right  spinal canal at T5-T6 with Moderate To Severe Cord Compression, And Mild Spinal Cord Edema. Associated near complete occlusion of the right T6 neural foramen. And early extraosseous tumor extension suspected from the right T1 facet. 3. No other thoracic epidural tumor or spinal stenosis. 4. Severe bladder distension. 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 5. 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   I, Rayna Sexton, PA-C, personally reviewed and evaluated these images and lab results as part of my medical decision-making.  Patient discussed with Dr. Arnoldo Morale with neurosurgery.  He has evaluated patient's MRIs.  He recommends surgery later today.  This was discussed with the patient and he is amenable.  Patient is not anticoagulated.  He has had no p.o. intake since coming to the emergency department 11.5 hours ago.  COVID-19 test is pending.  Note: Portions of this report may have been transcribed using voice recognition software. Every effort was made to ensure accuracy; however, inadvertent computerized transcription errors may be present.   Final Clinical Impression(s) / ED Diagnoses Final diagnoses:  Weakness of lower extremity, unspecified laterality  Lesion of bone of thoracic spine  Lytic bone lesion of hip  Malignant tumor of vertebral column [facility])   Rx / DC Orders ED Discharge Orders     None         Rayna Sexton, PA-C 71/16/57 [id number]    Delora Fuel, MD 33/38/32 [phone]

## 2021-08-19 NOTE — Anesthesia Procedure Notes (Addendum)
Procedure Name: Intubation Date/Time: 08/19/2021 8:38 AM Performed by: Carolan Clines, CRNA Pre-anesthesia Checklist: Patient identified, Emergency Drugs available, Suction available and Patient being monitored Patient Re-evaluated:Patient Re-evaluated prior to induction Oxygen Delivery Method: Circle system utilized Preoxygenation: Pre-oxygenation with 100% oxygen Induction Type: IV induction Ventilation: Mask ventilation without difficulty Laryngoscope Size: Mac and 4 Grade View: Grade I Tube type: Oral Tube size: 7.5 mm Number of attempts: 1 Airway Equipment and Method: Stylet and Oral airway Placement Confirmation: ETT inserted through vocal cords under direct vision, positive ETCO2 and breath sounds checked- equal and bilateral Secured at: 22 cm Tube secured with: Tape Dental Injury: Teeth and Oropharynx as per pre-operative assessment

## 2021-08-19 NOTE — Anesthesia Procedure Notes (Signed)
Arterial Line Insertion Start/End1/03/2022 8:00 AM Performed by: Carolan Clines, CRNA, CRNA  Patient location: Pre-op. Preanesthetic checklist: patient identified, IV checked, site marked, risks and benefits discussed, surgical consent, monitors and equipment checked, pre-op evaluation, timeout performed and anesthesia consent Lidocaine 1% used for infiltration Left, radial was placed Catheter size: 20 G Hand hygiene performed  and maximum sterile barriers used   Attempts: 1 Procedure performed without using ultrasound guided technique. Following insertion, dressing applied and Biopatch. Post procedure assessment: normal and unchanged  Patient tolerated the procedure well with no immediate complications.

## 2021-08-19 NOTE — Progress Notes (Signed)
Orthopedic Tech Progress Note Patient Details:  Clifford Jenkins [birth year] [id number]  TLSO brace dropped off at bedside in PACU  Ortho Devices Type of Ortho Device: Thoracolumbar corset (TLSO) Ortho Device/Splint Location: bedside PACU 9 Ortho Device/Splint Interventions: Ordered      Carin Primrose 08/19/2021, 12:52 PM

## 2021-08-19 NOTE — Op Note (Signed)
Brief history: The patient is a 61 year old black male who presented with paraparesis and a thoracic spine tumor.  I discussed the situation with the patient and his wife.  He has decided proceed with surgery.  Preoperative diagnosis: Thoracic spine metastasis/tumor, thoracic spinal stenosis, paraparesis, thoracic spine pain  Postoperative diagnosis: The same  Procedure: T5-6 and T6-7 laminectomy for transpedicular resection of thoracic spine tumor using microdissection; T5-6 and T6-7 posterior lateral arthrodesis with interval probe bone graft extender; T5-T7 posterior instrumentation with globus titanium pedicle screws and rods  Surgeon: Dr. Earle Gell  Asst.: Arnetha Massy, NP  Anesthesia: Gen. endotracheal  Estimated blood loss: 125 cc  Drains: 1 epidural Hemovac drain  Complications: None  Description of procedure: The patient was brought to the operating room by the anesthesia team. General endotracheal anesthesia was induced. The patient was turned to the prone position on the [place] table. The patient's thoracic region was then prepared with Betadine scrub and Betadine solution. Sterile drapes were applied.  I then injected the area to be incised with Marcaine with epinephrine solution. I then used the scalpel to make a linear midline incision over the T4-5, T5-6 and T6-7 interspace. I then used electrocautery to perform a bilateral subperiosteal dissection exposing the spinous process and lamina of T5, T6 and T7. We then used intraoperative fluoroscopy to confirm our location. We then inserted the [facility] retractor to provide exposure.  I incised the interspinous ligament at T5-6 and T6-7.  We used a Leksell rongeur to remove the T5 and T6 spinous process.  I began the decompression by using the high speed drill to perform laminotomies at T5-6 and T6-7.  We brought the operative microscope into the field and under its medication and illumination completed the decompression.  We  then used the Kerrison punches to complete the T6 laminectomy and to perform a limited laminectomy at T5 and T7 and removed the ligamentum flavum at T5-6 and T6-7.  I then used a high-speed drill to drill down the medial aspect of the right T6 pedicle.  This gave wide exposure to the obvious tumor on the right from T5-6 to T6-7.  I used the [place] to debulk the tumor and decompress the thecal sac/spinal cord.  We obtain specimens for the pathologist.  At this point the spinal cord was well decompressed.  We now turned attention to the instrumentation. Under fluoroscopic guidance we cannulated the bilateral T5 and T7 pedicles with the bone probe. We then removed the bone probe. We then tapped the pedicle with a 4.5 millimeter tap. We then removed the tap. We probed inside the tapped pedicle with a ball probe to rule out cortical breaches. We then inserted a 5.5 x 40 millimeter pedicle screw into the T5 and T7 pedicles bilaterally under fluoroscopic guidance.  We got good bony purchase.  We then connected the unilateral pedicle screws with a lordotic rod. We compressed the construct and secured the rod in place with the caps. We then tightened the caps appropriately. This completed the instrumentation from C5-C7 bilaterally.  We now turned our attention to the posterior lateral arthrodesis at T5-6 and T6-7. We used the high-speed drill to decorticate the remainder of the facets, pars, transverse process at T5-6 and T6-7. We then applied a combination of local morselized autograft bone and Zimmer DBM over these decorticated posterior lateral structures. This completed the posterior lateral arthrodesis.  We then obtained hemostasis using bipolar electrocautery. We irrigated the wound out with bacitracin solution. We inspected the  thecal sac and noted it was well decompressed. We then removed the retractor.  We injected Exparel .  We placed a medium Hemovac drain in the epidural space and tunneled it out through a  separate stab wound.  We reapproximated patient's thoracolumbar fascia with interrupted #1 Vicryl suture. We reapproximated patient's subcutaneous tissue with interrupted 2-0 Vicryl suture. The reapproximated patient's skin with Steri-Strips and benzoin. The wound was then coated with bacitracin ointment. A sterile dressing was applied. The drapes were removed. The patient was subsequently returned to the supine position where they were extubated by the anesthesia team. He was then transported to the post anesthesia care unit in stable condition. All sponge instrument and needle counts were reportedly correct at the end of this case.

## 2021-08-19 NOTE — Transfer of Care (Signed)
Immediate Anesthesia Transfer of Care Note  Patient: Clifford Jenkins  Procedure(s) Performed: THORACIC SIX LAMINECTOMY FOR TUMOR WITH INSTRUMENTATION AT THORACIC FIVE-THORACIC SEVEN (Spine Thoracic)  Patient Location: PACU  Anesthesia Type:General  Level of Consciousness: drowsy  Airway & Oxygen Therapy: Patient Spontanous Breathing and Patient connected to face mask oxygen  Post-op Assessment: Report given to RN and Post -op Vital signs reviewed and stable  Post vital signs: Reviewed and stable  Last Vitals:  Vitals Value Taken Time  BP 132/78 08/19/21 1230  Temp    Pulse 83 08/19/21 1232  Resp 20 08/19/21 1232  SpO2 100 % 08/19/21 1232  Vitals shown include unvalidated device data.  Last Pain:  Vitals:   08/19/21 0811  TempSrc:   PainSc: 0-No pain         Complications: No notable events documented.

## 2021-08-19 NOTE — Anesthesia Postprocedure Evaluation (Signed)
Anesthesia Post Note  Patient: Clifford Jenkins  Procedure(s) Performed: THORACIC SIX LAMINECTOMY FOR TUMOR WITH INSTRUMENTATION AT THORACIC [place] (Spine Thoracic)     Patient location during evaluation: PACU Anesthesia Type: General Level of consciousness: awake and alert Pain management: pain level controlled Vital Signs Assessment: post-procedure vital signs reviewed and stable Respiratory status: spontaneous breathing, nonlabored ventilation and respiratory function stable Cardiovascular status: blood pressure returned to baseline and stable Postop Assessment: no apparent nausea or vomiting Anesthetic complications: no   No notable events documented.  Last Vitals:  Vitals:   08/19/21 1300 08/19/21 1315  BP: 121/70 140/90  Pulse: 67 97  Resp: 13 12  Temp:    SpO2: 100% 100%    Last Pain:  Vitals:   08/19/21 1300  TempSrc:   PainSc: 0-No pain                 Ibtisam Benge,W. EDMOND

## 2021-08-20 ENCOUNTER — Encounter (HOSPITAL_COMMUNITY): Payer: Self-pay | Admitting: Neurosurgery

## 2021-08-20 DIAGNOSIS — D492 Neoplasm of unspecified behavior of bone, soft tissue, and skin: Secondary | ICD-10-CM | POA: Diagnosis not present

## 2021-08-20 LAB — BASIC METABOLIC PANEL
Anion gap: 9 (ref 5–15)
BUN: 12 mg/dL (ref 6–20)
CO2: 28 mmol/L (ref 22–32)
Calcium: 8.7 mg/dL — ABNORMAL LOW (ref 8.9–10.3)
Chloride: 98 mmol/L (ref 98–111)
Creatinine, Ser: 0.9 mg/dL (ref 0.61–1.24)
GFR, Estimated: >60 mL/min (ref >60)
Glucose, Bld: 132 mg/dL — ABNORMAL HIGH (ref 70–99)
Potassium: 4.4 mmol/L (ref 3.5–5.1)
Sodium: 135 mmol/L (ref 135–145)

## 2021-08-20 LAB — CBC
HCT: 38.8 % — ABNORMAL LOW (ref 39.0–52.0)
Hemoglobin: 13 g/dL (ref 13.0–17.0)
MCH: 29.9 pg (ref 26.0–34.0)
MCHC: 33.5 g/dL (ref 30.0–36.0)
MCV: 89.2 fL (ref 80.0–100.0)
Platelets: 204 10*3/uL (ref 150–400)
RBC: 4.35 MIL/uL (ref 4.22–5.81)
RDW: 12.2 % (ref 11.5–15.5)
WBC: 15.1 10*3/uL — ABNORMAL HIGH (ref 4.0–10.5)
nRBC: 0 % (ref 0.0–0.2)

## 2021-08-20 LAB — PSA: Prostatic Specific Antigen: 674 ng/mL — ABNORMAL HIGH (ref 0.00–4.00)

## 2021-08-20 MED ORDER — DEGARELIX ACETATE(240 MG DOSE) 120 MG/VIAL ~~LOC~~ SOLR
240.0000 mg | Freq: Once | SUBCUTANEOUS | Status: AC
  Administered 2021-08-21: 240 mg via SUBCUTANEOUS
  Filled 2021-08-20: qty 6

## 2021-08-20 NOTE — Consult Note (Addendum)
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 3. No other thoracic epidural tumor or spinal stenosis. 4. Severe bladder distension. 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 5. 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 Electronically Signed: By: Genevie Ann M.D. On: 08/19/2021 05:48   DG C-Arm 1-60 Min-No Report  Result Date: 08/19/2021 CLINICAL DATA:  Status post T-spine laminectomy. EXAM: THORACIC SPINE 2 VIEWS; DG C-ARM 1-60 MIN-NO REPORT COMPARISON:  01/17/2022 FINDINGS: 2 images from portable C-arm radiography obtained in the operating room show changes from T5-6 and T6-7 laminectomy and T5-6 and T6-7 posterior-lateral arthrodesis. IMPRESSION: 1. Postoperative changes as above. Electronically Signed   By: Lovena Le  Clovis Riley M.D.   On: 08/19/2021 14:16   DG C-Arm 1-60 Min-No Report  Result Date: 08/19/2021 CLINICAL DATA:  Status post T-spine laminectomy. EXAM: THORACIC SPINE 2 VIEWS; DG C-ARM 1-60 MIN-NO REPORT COMPARISON:  01/17/2022 FINDINGS: 2 images from portable C-arm radiography obtained in the operating room show changes from T5-6 and T6-7 laminectomy and T5-6  and T6-7 posterior-lateral arthrodesis. IMPRESSION: 1. Postoperative changes as above. Electronically Signed   By: Kerby Moors M.D.   On: 08/19/2021 14:16   DG C-Arm 1-60 Min-No Report  Result Date: 08/19/2021 Fluoroscopy was utilized by the requesting physician.  No radiographic interpretation.     CT ABDOMEN PELVIS WO CONTRAST  Result Date: 08/19/2021 CLINICAL DATA:  Abdominal pain EXAM: CT ABDOMEN AND PELVIS WITHOUT CONTRAST TECHNIQUE: Multidetector CT imaging of the abdomen and pelvis was performed following the standard protocol without IV contrast. COMPARISON:  04/15/2012 FINDINGS: Lower chest: No acute abnormality. Hepatobiliary: No focal hepatic abnormality. Gallbladder unremarkable. Pancreas: No focal abnormality or ductal dilatation. Spleen: No focal abnormality.  Normal size. Adrenals/Urinary Tract: No adrenal abnormality. No focal renal abnormality. No stones or hydronephrosis. Urinary bladder is unremarkable. Stomach/Bowel: Moderate stool burden throughout the colon. Normal appendix. Stomach, large and small bowel grossly unremarkable. Vascular/Lymphatic: Aortic atherosclerosis. No aneurysm. Probable enlarged left external iliac chain lymph node on image 67 of series 3 with a short axis diameter of 3 cm. No additional adenopathy. Reproductive: No visible focal abnormality. Other: No free fluid or free air. Musculoskeletal: Sclerotic lesion throughout the right iliac bone. Rounded sclerotic lesion in the left iliac bone. These are new since prior study. Mixed sclerotic and lucent destructive lesion noted superiorly in the right iliac bone. Mixed lucent and sclerotic lesions within the L1 and L3 vertebral body. These are new since prior study. Predominantly lytic lesion within the inferior right femoral head. IMPRESSION: Mixed lytic and sclerotic lesions seen within the L2, L4 vertebral bodies and right iliac bone with lytic destructive lesion in the superior aspect of the right iliac bone near  the right SI joint. Rounded sclerotic lesion within the left iliac bone. Predominantly lytic lesion within the inferior right femoral head. Appearance is concerning for possible metastatic disease. Recommend clinical correlation for cancer history. These could be further evaluated with nuclear medicine bone scan. Left external iliac adenopathy could also reflect metastatic disease or lymphoma. Aortic atherosclerosis. Moderate stool burden throughout the colon. Electronically Signed   By: Rolm Baptise M.D.   On: 08/19/2021 00:53   DG Thoracic Spine 2 View  Result Date: 08/19/2021 CLINICAL DATA:  Status post T-spine laminectomy. EXAM: THORACIC SPINE 2 VIEWS; DG C-ARM 1-60 MIN-NO REPORT COMPARISON:  01/17/2022 FINDINGS: 2 images from portable C-arm radiography obtained in the operating room show changes from T5-6 and T6-7 laminectomy and T5-6 and T6-7 posterior-lateral arthrodesis. IMPRESSION: 1. Postoperative changes as above. Electronically Signed   By: Kerby Moors M.D.   On: 08/19/2021 14:16   DG Lumbar Spine Complete  Result Date: 08/14/2021 CLINICAL DATA:  Low back pain, hurt while lifting boxes EXAM: LUMBAR SPINE - COMPLETE 4+ VIEW COMPARISON:  None. FINDINGS: There are 5 non-rib-bearing lumbar type vertebral bodies. Vertebral body heights are preserved. Alignment is normal. There is no spondylolysis. The disc spaces are overall preserved. There is minimal degenerative endplate change and mild facet arthropathy in the lower lumbar spine. The SI joints and symphysis pubis are intact. The soft tissues are unremarkable. IMPRESSION: Mild degenerative changes in the lower lumbar spine. No acute findings. Electronically Signed  By: Valetta Mole M.D.   On: 08/14/2021 10:02   DG Abdomen 1 View  Result Date: 08/18/2021 CLINICAL DATA:  Constipation. EXAM: ABDOMEN - 1 VIEW COMPARISON:  CT abdomen pelvis dated 04/15/2012. FINDINGS: Moderate stool throughout the colon. No bowel dilatation or evidence of  obstruction. No free air or radiopaque calculi. The osseous structures are intact. The soft tissues are unremarkable. IMPRESSION: Constipation. No bowel obstruction. Electronically Signed   By: Anner Crete M.D.   On: 08/18/2021 20:28   CT Chest W Contrast  Result Date: 08/19/2021 CLINICAL DATA:  Occult malignancy. EXAM: CT CHEST WITH CONTRAST TECHNIQUE: Multidetector CT imaging of the chest was performed during intravenous contrast administration. CONTRAST:  69mL OMNIPAQUE IOHEXOL 300 MG/ML  SOLN COMPARISON:  None. FINDINGS: Cardiovascular: The heart is normal in size and there is no pericardial effusion. Scattered coronary artery calcifications are noted. There is minimal atherosclerotic calcification of the aorta without evidence of aneurysm. The pulmonary trunk is normal in caliber. Mediastinum/Nodes: No enlarged mediastinal, hilar, or axillary lymph nodes. Thyroid gland, trachea, and esophagus demonstrate no significant findings. Lungs/Pleura: There is a nodule in the left upper lobe measuring 4 mm, axial image 70. No effusion or pneumothorax. Upper Abdomen: A 1.2 cm hypodensity is noted in the caudate lobe of the liver, possible cyst. Musculoskeletal: There is an expansile sclerotic mass in the T3 rib on the right with associated soft tissue thickening. An old rib fracture is present at T7 on the left. Mixed lytic and sclerotic lesions are seen at T3, T4, T6, and L1. IMPRESSION: 1. Mixed lytic and sclerotic lesions in the T3, T4, T6, and L1 vertebral bodies and expansile sclerotic lesion with associated soft tissue thickening in the T3 rim on the right, suspicious for neoplastic process, possible metastatic disease. Biopsy may be beneficial for definitive diagnosis. 2. Hypodensity in the caudate lobe of the liver, possible cyst. 3. Scattered coronary artery calcifications. 4. Aortic atherosclerosis. Electronically Signed   By: Brett Fairy M.D.   On: 08/19/2021 03:23   MR THORACIC SPINE W WO  CONTRAST  Addendum Date: 08/19/2021   ADDENDUM REPORT: 08/19/2021 05:55 ADDENDUM: Salient findings discussed by telephone with Dr. Roxanne Mins in the Emergency Department at 0551 hours. Electronically Signed   By: Genevie Ann M.D.   On: 08/19/2021 05:55   Result Date: 08/19/2021 CLINICAL DATA:  61 year old male with abdominal pain, evidence of osseous metastatic disease on CT Chest, Abdomen, and Pelvis today, including multiple lytic spine lesions. EXAM: MRI THORACIC AND LUMBAR SPINE WITHOUT AND WITH CONTRAST TECHNIQUE: Multiplanar and multiecho pulse sequences of the thoracic and lumbar spine were obtained without and with intravenous contrast. CONTRAST:  82mL GADAVIST GADOBUTROL 1 MMOL/ML IV SOLN COMPARISON:  CT Chest, Abdomen, and Pelvis 0031 hours and 0303 hours today. FINDINGS: MRI THORACIC SPINE FINDINGS Limited cervical spine imaging:  Grossly negative. Thoracic spine segmentation:  Normal on the comparison. Alignment:  Maintained thoracic kyphosis. Vertebrae: Enhancing STIR hyperintense tumor in the right T1 facets, replacing the T3 vertebral body (additional details below, in the superior T4 vertebral body, and subtotally replacing the T6 vertebra (details below). No definite posterior rib metastasis. Cord: Moderate to severe cord compression at the T6 level (series 18, image 18). See further detailed below. Mild associated cord edema. Above and below that level spinal cord signal and morphology within normal limits. There is mild dural thickening and enhancement there (series 32, image 18), but no convincing intradural enhancement, no abnormal dural thickening or enhancement elsewhere. Conus medullaris appears  normal at T12-L1. Paraspinal and other soft tissues: Early ventral and lateral paraspinal extension of tumor suspected at T6. See additional details below. And there might also be early extraosseous extension of tumor from the right T1 facets (series 7, image 2). Other thoracic and abdominal viscera as  reported by CT earlier today. Disc levels: T3: Mild superior endplate pathologic compression fracture stable from the earlier CT. Early ventral epidural tumor (series 15, image 9) but no cord compression. No foraminal involvement. T5-T6: Bulky up to 25 mm long axis epidural tumor occupies the right spinal canal here extending to just above the T5-T6 disc space (series 15, image 7) with underlying subtotal T6 vertebral body tumor replacement including posterior elements on the right side. The partially cystic or necrotic epidural mass results in moderate to severe compression of the spinal cord into the anterior left canal (series 18, image 18) plus some cord edema as above. There is also subtotal occlusion of the right T6 neural foramen, and mild stenosis of the right T5 foramen. Additional mild T6 pathologic inferior endplate compression was better demonstrated by CT today. No other thoracic epidural tumor or spinal stenosis. MRI LUMBAR SPINE FINDINGS Segmentation:  Normal, concordant with the numbering above. Alignment:  Preserved lumbar lordosis. Vertebrae: Subtotal replacement of the L1 and L3 vertebral bodies by STIR hyperintense heterogeneously enhancing tumor. Mild pathologic inferior compression at both levels as seen by CT today. Posterior elements of both levels appear spared. Large infiltrating tumor of the right iliac bone with posterior extraosseous extension (series 31, image 31 and series 26, image 1) abuts the right SI joint. Smaller round left medial iliac bone metastasis. Visible sacrum remains intact. Conus medullaris: Extends to the T12-L1 level. No lower spinal cord or conus signal abnormality. No abnormal intradural enhancement. Normal cauda equina nerve roots. No lumbosacral dural thickening. Paraspinal and other soft tissues: Severe bladder distension. Oval metastatic left iliac lymph node up to 3.8 cm diameter (series 31, image 38). Other abdominal and pelvic viscera as on CT today. Disc  levels: No lumbar epidural or foraminal tumor. Disc and endplate degeneration maximal at L3-L4 with mild degenerative foraminal stenosis at that level. IMPRESSION: 1. 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 Vertebral tumor with mild pathologic compression fractures of T3, T6, L1, and L3. 2. 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 Associated near complete occlusion of the right T6 neural foramen. And early extraosseous tumor extension suspected from the right T1 facet. 3. No other thoracic epidural tumor or spinal stenosis. 4. Severe bladder distension. 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 5. 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 Electronically Signed: By: Genevie Ann M.D. On: 08/19/2021 05:48   MR Lumbar Spine W Wo Contrast  Addendum Date: 08/19/2021   ADDENDUM REPORT: 08/19/2021 05:55 ADDENDUM: Salient findings discussed by telephone with Dr. Roxanne Mins in the Emergency Department at 0551 hours. Electronically Signed   By: Genevie Ann M.D.   On: 08/19/2021 05:55   Result Date: 08/19/2021 CLINICAL DATA:  61 year old male with abdominal pain, evidence of osseous metastatic disease on CT Chest, Abdomen, and Pelvis today, including multiple lytic spine lesions. EXAM: MRI THORACIC AND LUMBAR SPINE WITHOUT AND WITH CONTRAST TECHNIQUE: Multiplanar and multiecho pulse sequences of the thoracic and lumbar spine were obtained without and with intravenous contrast. CONTRAST:  65mL GADAVIST GADOBUTROL  1 MMOL/ML IV SOLN COMPARISON:  CT Chest, Abdomen, and Pelvis 0031 hours and 0303 hours today. FINDINGS: MRI THORACIC SPINE FINDINGS Limited cervical spine imaging:  Grossly negative. Thoracic spine segmentation:  Normal on the comparison. Alignment:  Maintained thoracic kyphosis. Vertebrae: Enhancing STIR hyperintense tumor in the  right T1 facets, replacing the T3 vertebral body (additional details below, in the superior T4 vertebral body, and subtotally replacing the T6 vertebra (details below). No definite posterior rib metastasis. Cord: Moderate to severe cord compression at the T6 level (series 18, image 18). See further detailed below. Mild associated cord edema. Above and below that level spinal cord signal and morphology within normal limits. There is mild dural thickening and enhancement there (series 32, image 18), but no convincing intradural enhancement, no abnormal dural thickening or enhancement elsewhere. Conus medullaris appears normal at T12-L1. Paraspinal and other soft tissues: Early ventral and lateral paraspinal extension of tumor suspected at T6. See additional details below. And there might also be early extraosseous extension of tumor from the right T1 facets (series 7, image 2). Other thoracic and abdominal viscera as reported by CT earlier today. Disc levels: T3: Mild superior endplate pathologic compression fracture stable from the earlier CT. Early ventral epidural tumor (series 15, image 9) but no cord compression. No foraminal involvement. T5-T6: Bulky up to 25 mm long axis epidural tumor occupies the right spinal canal here extending to just above the T5-T6 disc space (series 15, image 7) with underlying subtotal T6 vertebral body tumor replacement including posterior elements on the right side. The partially cystic or necrotic epidural mass results in moderate to severe compression of the spinal cord into the anterior left canal (series 18, image 18) plus some cord edema as above. There is also subtotal occlusion of the right T6 neural foramen, and mild stenosis of the right T5 foramen. Additional mild T6 pathologic inferior endplate compression was better demonstrated by CT today. No other thoracic epidural tumor or spinal stenosis. MRI LUMBAR SPINE FINDINGS Segmentation:  Normal, concordant with the numbering  above. Alignment:  Preserved lumbar lordosis. Vertebrae: Subtotal replacement of the L1 and L3 vertebral bodies by STIR hyperintense heterogeneously enhancing tumor. Mild pathologic inferior compression at both levels as seen by CT today. Posterior elements of both levels appear spared. Large infiltrating tumor of the right iliac bone with posterior extraosseous extension (series 31, image 31 and series 26, image 1) abuts the right SI joint. Smaller round left medial iliac bone metastasis. Visible sacrum remains intact. Conus medullaris: Extends to the T12-L1 level. No lower spinal cord or conus signal abnormality. No abnormal intradural enhancement. Normal cauda equina nerve roots. No lumbosacral dural thickening. Paraspinal and other soft tissues: Severe bladder distension. Oval metastatic left iliac lymph node up to 3.8 cm diameter (series 31, image 38). Other abdominal and pelvic viscera as on CT today. Disc levels: No lumbar epidural or foraminal tumor. Disc and endplate degeneration maximal at L3-L4 with mild degenerative foraminal stenosis at that level. IMPRESSION: 1. 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 Vertebral tumor with mild pathologic compression fractures of T3, T6, L1, and L3. 2. 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 Associated near complete occlusion of the right T6 neural foramen. And early extraosseous tumor extension suspected from the right T1 facet. 3. No other thoracic epidural tumor or spinal stenosis. 4. Severe bladder distension.  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 5. 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 Electronically Signed: By: Genevie Ann M.D. On: 08/19/2021 05:48   DG C-Arm 1-60 Min-No Report  Result Date: 08/19/2021 CLINICAL DATA:  Status post T-spine laminectomy. EXAM:  THORACIC SPINE 2 VIEWS; DG C-ARM 1-60 MIN-NO REPORT COMPARISON:  01/17/2022 FINDINGS: 2 images from portable C-arm radiography obtained in the operating room show changes from T5-6 and T6-7 laminectomy and T5-6 and T6-7 posterior-lateral arthrodesis. IMPRESSION: 1. Postoperative changes as above. Electronically Signed   By: Kerby Moors M.D.   On: 08/19/2021 14:16   DG C-Arm 1-60 Min-No Report  Result Date: 08/19/2021 CLINICAL DATA:  Status post T-spine laminectomy. EXAM: THORACIC SPINE 2 VIEWS; DG C-ARM 1-60 MIN-NO REPORT COMPARISON:  01/17/2022 FINDINGS: 2 images from portable C-arm radiography obtained in the operating room show changes from T5-6 and T6-7 laminectomy and T5-6 and T6-7 posterior-lateral arthrodesis. IMPRESSION: 1. Postoperative changes as above. Electronically Signed   By: Kerby Moors M.D.   On: 08/19/2021 14:16   DG C-Arm 1-60 Min-No Report  Result Date: 08/19/2021 Fluoroscopy was utilized by the requesting physician.  No radiographic interpretation.    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 Alen Blew who is the prostate cancer expert in the outpatient setting in the next week or so as well as radiation oncology consultation for consideration for radiation therapy in the near future We will set up outpatient follow-up  Leukocytosis, likely reactive Monitor  Hypertension On Avapro  Goals of care He is aware of stage IV disease that is not curable but highly treatable at this stage  Discharge  planning He is improving following surgery and will likely be here for another 1 to 2 days.  He is being evaluated for CIR.   Thank you for this referral.   Clifford Bussing, DNP, AGPCNP-BC, AOCNP  Lanissa Cashen Alvy Bimler

## 2021-08-20 NOTE — Progress Notes (Signed)
Patient is holding 689 ml of urine, perineal sensation is intact, Straight cath performed at 1300, initial output was 800

## 2021-08-20 NOTE — Progress Notes (Signed)
On-call MD Orson Slick) called back and was notified of the Hemovac out but no new order given. We continue to monitor.

## 2021-08-20 NOTE — Plan of Care (Signed)

## 2021-08-20 NOTE — Evaluation (Signed)
Occupational Therapy Evaluation Patient Details Name: Clifford Jenkins MRN: [id number] DOB: [birth year] Today's Date: 08/20/2021   History of Present Illness Clifford Jenkins is a 61 y.o. male. who was worked up for 2-3 weeks of worsening back pain with urinary retention and BLE numbness. 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 Overall Clifford Jenkins required min A for bed mobility and min A fro short ambulation within tearoom. Clifford Jenkins reports he has bilat foot numbness and was unable to clear his RLR fully from the ground, sliding/shuffling with RW instead. He will benefit from continued OT actuely. Clifford Jenkins is extremely motivated to get better.    Recommendations for follow up therapy are one component of a multi-disciplinary discharge planning process, led by the attending physician.  Recommendations may be updated based on patient status, additional functional criteria and insurance authorization.   Follow Up Recommendations  Acute inpatient rehab (3hours/day)    Assistance Recommended at Discharge Frequent or constant Supervision/Assistance  Patient can return home with the following A little help with walking and/or transfers;A little help with bathing/dressing/bathroom;Assistance with cooking/housework;Assist for transportation;Help with stairs or ramp for entrance    Functional Status Assessment  Patient has had a recent decline in their functional status and demonstrates the ability to make significant improvements in function in a reasonable and predictable amount of time.  Equipment Recommendations  Wheelchair cushion (measurements OT);Wheelchair (measurements OT) (RW)    Recommendations for Other Services Rehab consult     Precautions / Restrictions Precautions Precautions: Fall;Back Precaution Booklet Issued: No Precaution Comments: verbally  reviewed Restrictions Weight Bearing Restrictions: No      Mobility Bed Mobility Overal bed mobility: Needs Assistance Bed Mobility: Rolling;Sidelying to Sit Rolling: Supervision Sidelying to sit: Min assist       General bed mobility comments: verbal cues for log roll    Transfers Overall transfer level: Needs assistance Equipment used: Rolling walker (2 wheels) Transfers: Sit to/from Stand Sit to Stand: Min assist                  Balance Overall balance assessment: Needs assistance Sitting-balance support: Feet supported;Single extremity supported Sitting balance-Leahy Scale: Fair Sitting balance - Comments: Clifford Jenkins benefits from at least 1UE supported for sitting balance   Standing balance support: Bilateral upper extremity supported;During functional activity Standing balance-Leahy Scale: Poor                             ADL either performed or assessed with clinical judgement   ADL Overall ADL's : Needs assistance/impaired Eating/Feeding: Independent   Grooming: Set up;Sitting   Upper Body Bathing: Minimal assistance;Sitting   Lower Body Bathing: Moderate assistance;Sit to/from stand   Upper Body Dressing : Set up;Sitting Upper Body Dressing Details (indicate cue type and reason): min A for back brace Lower Body Dressing: Moderate assistance;Sit to/from stand   Toilet Transfer: Minimal assistance;Ambulation;Rolling walker (2 wheels);BSC/3in1   Toileting- Clothing Manipulation and Hygiene: Minimal assistance;Sit to/from stand       Functional mobility during ADLs: Minimal assistance;Rolling walker (2 wheels) General ADL Comments: assistance due to BLE numbness, inability to fully clear RLE during ambulation, poor activity tolerance and safety     Vision Baseline Vision/History: 0 No visual deficits Ability to See in Adequate Light: 0 Adequate Patient Visual Report: No  change from baseline Vision Assessment?: No apparent visual deficits      Perception     Praxis      Pertinent Vitals/Pain Pain Assessment: Faces Faces Pain Scale: Hurts a little bit Pain Location: sx site Pain Descriptors / Indicators: Discomfort Pain Intervention(s): Monitored during session     Hand Dominance Right   Extremity/Trunk Assessment Upper Extremity Assessment Upper Extremity Assessment: Overall WFL for tasks assessed   Lower Extremity Assessment Lower Extremity Assessment: Defer to Clifford Jenkins evaluation   Cervical / Trunk Assessment Cervical / Trunk Assessment: Back Surgery   Communication Communication Communication: HOH   Cognition Arousal/Alertness: Awake/alert Behavior During Therapy: WFL for tasks assessed/performed Overall Cognitive Status: Within Functional Limits for tasks assessed                                       General Comments  VSS on RA    Exercises     Shoulder Instructions      Home Living Family/patient expects to be discharged to:: Private residence Living Arrangements: Spouse/significant other (girlfriend) Available Help at Discharge: Available PRN/intermittently Type of Home: House Home Access: Stairs to enter Technical brewer of Steps: 1 Entrance Stairs-Rails: None Home Layout: One level     Bathroom Shower/Tub: Teacher, early years/pre: Standard     Home Equipment: None          Prior Functioning/Environment Prior Level of Function : Independent/Modified Independent;Driving             Mobility Comments: no AD ADLs Comments: indep        OT Problem List: Decreased strength;Decreased range of motion;Decreased activity tolerance;Impaired balance (sitting and/or standing);Decreased knowledge of use of DME or AE;Decreased knowledge of precautions;Pain      OT Treatment/Interventions: Self-care/ADL training;Therapeutic exercise;Balance training;Patient/family education;Therapeutic activities;DME and/or AE instruction    OT Goals(Current goals can  be found in the care plan section) Acute Rehab OT Goals Patient Stated Goal: get back to work OT Goal Formulation: With patient Time For Goal Achievement: 09/03/21 Potential to Achieve Goals: Fair ADL Goals Clifford Jenkins Will Perform Grooming: with modified independence;standing Clifford Jenkins Will Perform Lower Body Bathing: with modified independence;sit to/from stand Clifford Jenkins Will Perform Lower Body Dressing: with modified independence;sitting/lateral leans;with adaptive equipment Clifford Jenkins Will Transfer to Toilet: with modified independence;ambulating  OT Frequency: Min 1X/week    Co-evaluation              AM-PAC OT "6 Clicks" Daily Activity     Outcome Measure Help from another person eating meals?: None Help from another person taking care of personal grooming?: A Little Help from another person toileting, which includes using toliet, bedpan, or urinal?: A Little Help from another person bathing (including washing, rinsing, drying)?: A Little Help from another person to put on and taking off regular upper body clothing?: A Little Help from another person to put on and taking off regular lower body clothing?: A Little 6 Click Score: 19   End of Session Equipment Utilized During Treatment: Rolling walker (2 wheels);Back brace Nurse Communication: Mobility status  Activity Tolerance: Patient tolerated treatment well Patient left: in chair;with call bell/phone within reach;with chair alarm set  OT Visit Diagnosis: Unsteadiness on feet (R26.81);Other abnormalities of gait and mobility (R26.89);Repeated falls (R29.6);Muscle weakness (generalized) (M62.81);Pain                Time: 5462-7035 OT Time Calculation (min): 22  min Charges:  OT General Charges $OT Visit: 1 Visit OT Evaluation $OT Eval Moderate Complexity: 1 Mod   Reyn Faivre A Tywanna Seifer 08/20/2021, 9:01 AM

## 2021-08-20 NOTE — Progress Notes (Signed)
At 6 am this RN was trying to ambulate patient per MD order, patient was able to stand by the bedside with maximum assist but unable to walk. During the process of getting patient back to bed his drain at the incision site came out. Dressing site look clean and dry. Patient denied pain. On-call paged and waiting for call back. We continue to monitor.

## 2021-08-20 NOTE — Progress Notes (Signed)
Inpatient Rehab Admissions Coordinator:  ? ?Per therapy recommendations,  patient was screened for CIR candidacy by Welcome Fults, MS, CCC-SLP. At this time, Pt. Appears to be a a potential candidate for CIR. I will place   order for rehab consult per protocol for full assessment. Please contact me any with questions. ? ?Kaliyah Gladman, MS, CCC-SLP ?Rehab Admissions Coordinator  ?[phone] (celll) ?[phone] (office) ? ?

## 2021-08-20 NOTE — Progress Notes (Signed)
°  Transition of Care [facility]) Screening Note   Patient Details  Name: Clifford Jenkins Date of Birth: Jan 04, 1961   [facility] CM/SW Contact:    Pollie Friar, RN Phone Number: 08/20/2021, 11:35 AM    Transition of Care Department [facility]) has reviewed patient. We will continue to monitor patient advancement through interdisciplinary progression rounds. If new patient transition needs arise, please place a TOC consult.

## 2021-08-20 NOTE — Progress Notes (Signed)
Inpatient Rehab Admissions Coordinator:   Called to introduce self to patient and let him know CIR was following and at this time, he is interested.  Note still needs oncology consult for treatment plan.  I let him know once treatment plan in place we can discuss CIR further. I will follow up with him at bedside tomorrow.   Shann Medal, PT, DPT Admissions Coordinator [phone] 08/20/21  1:55 PM

## 2021-08-20 NOTE — Progress Notes (Signed)
Subjective: The patient is alert and pleasant.  He is in no apparent distress.  He looks well.  His drain was accidentally removed this morning.  Objective: Vital signs in last 24 hours: Temp:  [97.3 F (36.3 C)-99 F (37.2 C)] 98.8 F (37.1 C) (01/09 0402) Pulse Rate:  [67-97] 82 (01/09 0402) Resp:  [12-17] 16 (01/09 0402) BP: (113-148)/(70-93) 129/78 (01/09 0402) SpO2:  [94 %-100 %] 94 % (01/09 0402) Arterial Line BP: (147-153)/(71-80) 147/79 (01/08 1315) Estimated body mass index is 29.29 kg/m as calculated from the following:   Height as of this encounter: 5\' 7"  (1.702 m).   Weight as of this encounter: 84.8 kg.   Intake/Output from previous day: 01/08 0701 - 01/09 0700 In: 1610 [I.V.:1600; IV Piggyback:10] Out: 2470 [Urine:2250; Drains:70; Blood:150] Intake/Output this shift: No intake/output data recorded.  Physical exam the patient is alert and oriented.  His lower extremity strength is approximately 4/5, i.e. much better.  Lab Results: Recent Labs    08/18/21 2355 08/19/21 0932 08/20/21 0429  WBC 10.8*  --  15.1*  HGB 15.6 13.3 13.0  HCT 46.8 39.0 38.8*  PLT 221  --  204   BMET Recent Labs    08/18/21 2355 08/19/21 0932 08/20/21 0429  NA 135 133* 135  K 3.9 4.4 4.4  CL 96*  --  98  CO2 26  --  28  GLUCOSE 134*  --  132*  BUN 9  --  12  CREATININE 0.91  --  0.90  CALCIUM 9.7  --  8.7*    Studies/Results: CT ABDOMEN PELVIS WO CONTRAST  Result Date: 08/19/2021 CLINICAL DATA:  Abdominal pain EXAM: CT ABDOMEN AND PELVIS WITHOUT CONTRAST TECHNIQUE: Multidetector CT imaging of the abdomen and pelvis was performed following the standard protocol without IV contrast. COMPARISON:  04/15/2012 FINDINGS: Lower chest: No acute abnormality. Hepatobiliary: No focal hepatic abnormality. Gallbladder unremarkable. Pancreas: No focal abnormality or ductal dilatation. Spleen: No focal abnormality.  Normal size. Adrenals/Urinary Tract: No adrenal abnormality. No focal  renal abnormality. No stones or hydronephrosis. Urinary bladder is unremarkable. Stomach/Bowel: Moderate stool burden throughout the colon. Normal appendix. Stomach, large and small bowel grossly unremarkable. Vascular/Lymphatic: Aortic atherosclerosis. No aneurysm. Probable enlarged left external iliac chain lymph node on image 67 of series 3 with a short axis diameter of 3 cm. No additional adenopathy. Reproductive: No visible focal abnormality. Other: No free fluid or free air. Musculoskeletal: Sclerotic lesion throughout the right iliac bone. Rounded sclerotic lesion in the left iliac bone. These are new since prior study. Mixed sclerotic and lucent destructive lesion noted superiorly in the right iliac bone. Mixed lucent and sclerotic lesions within the L1 and L3 vertebral body. These are new since prior study. Predominantly lytic lesion within the inferior right femoral head. IMPRESSION: Mixed lytic and sclerotic lesions seen within the L2, L4 vertebral bodies and right iliac bone with lytic destructive lesion in the superior aspect of the right iliac bone near the right SI joint. Rounded sclerotic lesion within the left iliac bone. Predominantly lytic lesion within the inferior right femoral head. Appearance is concerning for possible metastatic disease. Recommend clinical correlation for cancer history. These could be further evaluated with nuclear medicine bone scan. Left external iliac adenopathy could also reflect metastatic disease or lymphoma. Aortic atherosclerosis. Moderate stool burden throughout the colon. Electronically Signed   By: Rolm Baptise M.D.   On: 08/19/2021 00:53   DG Thoracic Spine 2 View  Result Date: 08/19/2021 CLINICAL DATA:  Status post T-spine laminectomy. EXAM: THORACIC SPINE 2 VIEWS; DG C-ARM 1-60 MIN-NO REPORT COMPARISON:  01/17/2022 FINDINGS: 2 images from portable C-arm radiography obtained in the operating room show changes from T5-6 and T6-7 laminectomy and T5-6 and T6-7  posterior-lateral arthrodesis. IMPRESSION: 1. Postoperative changes as above. Electronically Signed   By: Kerby Moors M.D.   On: 08/19/2021 14:16   DG Abdomen 1 View  Result Date: 08/18/2021 CLINICAL DATA:  Constipation. EXAM: ABDOMEN - 1 VIEW COMPARISON:  CT abdomen pelvis dated 04/15/2012. FINDINGS: Moderate stool throughout the colon. No bowel dilatation or evidence of obstruction. No free air or radiopaque calculi. The osseous structures are intact. The soft tissues are unremarkable. IMPRESSION: Constipation. No bowel obstruction. Electronically Signed   By: Anner Crete M.D.   On: 08/18/2021 20:28   CT Chest W Contrast  Result Date: 08/19/2021 CLINICAL DATA:  Occult malignancy. EXAM: CT CHEST WITH CONTRAST TECHNIQUE: Multidetector CT imaging of the chest was performed during intravenous contrast administration. CONTRAST:  72mL OMNIPAQUE IOHEXOL 300 MG/ML  SOLN COMPARISON:  None. FINDINGS: Cardiovascular: The heart is normal in size and there is no pericardial effusion. Scattered coronary artery calcifications are noted. There is minimal atherosclerotic calcification of the aorta without evidence of aneurysm. The pulmonary trunk is normal in caliber. Mediastinum/Nodes: No enlarged mediastinal, hilar, or axillary lymph nodes. Thyroid gland, trachea, and esophagus demonstrate no significant findings. Lungs/Pleura: There is a nodule in the left upper lobe measuring 4 mm, axial image 70. No effusion or pneumothorax. Upper Abdomen: A 1.2 cm hypodensity is noted in the caudate lobe of the liver, possible cyst. Musculoskeletal: There is an expansile sclerotic mass in the T3 rib on the right with associated soft tissue thickening. An old rib fracture is present at T7 on the left. Mixed lytic and sclerotic lesions are seen at T3, T4, T6, and L1. IMPRESSION: 1. Mixed lytic and sclerotic lesions in the T3, T4, T6, and L1 vertebral bodies and expansile sclerotic lesion with associated soft tissue thickening in  the T3 rim on the right, suspicious for neoplastic process, possible metastatic disease. Biopsy may be beneficial for definitive diagnosis. 2. Hypodensity in the caudate lobe of the liver, possible cyst. 3. Scattered coronary artery calcifications. 4. Aortic atherosclerosis. Electronically Signed   By: Brett Fairy M.D.   On: 08/19/2021 03:23   MR THORACIC SPINE W WO CONTRAST  Addendum Date: 08/19/2021   ADDENDUM REPORT: 08/19/2021 05:55 ADDENDUM: Salient findings discussed by telephone with Dr. Roxanne Mins in the Emergency Department at 0551 hours. Electronically Signed   By: Genevie Ann M.D.   On: 08/19/2021 05:55   Result Date: 08/19/2021 CLINICAL DATA:  61 year old male with abdominal pain, evidence of osseous metastatic disease on CT Chest, Abdomen, and Pelvis today, including multiple lytic spine lesions. EXAM: MRI THORACIC AND LUMBAR SPINE WITHOUT AND WITH CONTRAST TECHNIQUE: Multiplanar and multiecho pulse sequences of the thoracic and lumbar spine were obtained without and with intravenous contrast. CONTRAST:  42mL GADAVIST GADOBUTROL 1 MMOL/ML IV SOLN COMPARISON:  CT Chest, Abdomen, and Pelvis 0031 hours and 0303 hours today. FINDINGS: MRI THORACIC SPINE FINDINGS Limited cervical spine imaging:  Grossly negative. Thoracic spine segmentation:  Normal on the comparison. Alignment:  Maintained thoracic kyphosis. Vertebrae: Enhancing STIR hyperintense tumor in the right T1 facets, replacing the T3 vertebral body (additional details below, in the superior T4 vertebral body, and subtotally replacing the T6 vertebra (details below). No definite posterior rib metastasis. Cord: Moderate to severe cord compression at the T6 level (  series 18, image 18). See further detailed below. Mild associated cord edema. Above and below that level spinal cord signal and morphology within normal limits. There is mild dural thickening and enhancement there (series 32, image 18), but no convincing intradural enhancement, no abnormal dural  thickening or enhancement elsewhere. Conus medullaris appears normal at T12-L1. Paraspinal and other soft tissues: Early ventral and lateral paraspinal extension of tumor suspected at T6. See additional details below. And there might also be early extraosseous extension of tumor from the right T1 facets (series 7, image 2). Other thoracic and abdominal viscera as reported by CT earlier today. Disc levels: T3: Mild superior endplate pathologic compression fracture stable from the earlier CT. Early ventral epidural tumor (series 15, image 9) but no cord compression. No foraminal involvement. T5-T6: Bulky up to 25 mm long axis epidural tumor occupies the right spinal canal here extending to just above the T5-T6 disc space (series 15, image 7) with underlying subtotal T6 vertebral body tumor replacement including posterior elements on the right side. The partially cystic or necrotic epidural mass results in moderate to severe compression of the spinal cord into the anterior left canal (series 18, image 18) plus some cord edema as above. There is also subtotal occlusion of the right T6 neural foramen, and mild stenosis of the right T5 foramen. Additional mild T6 pathologic inferior endplate compression was better demonstrated by CT today. No other thoracic epidural tumor or spinal stenosis. MRI LUMBAR SPINE FINDINGS Segmentation:  Normal, concordant with the numbering above. Alignment:  Preserved lumbar lordosis. Vertebrae: Subtotal replacement of the L1 and L3 vertebral bodies by STIR hyperintense heterogeneously enhancing tumor. Mild pathologic inferior compression at both levels as seen by CT today. Posterior elements of both levels appear spared. Large infiltrating tumor of the right iliac bone with posterior extraosseous extension (series 31, image 31 and series 26, image 1) abuts the right SI joint. Smaller round left medial iliac bone metastasis. Visible sacrum remains intact. Conus medullaris: Extends to the  T12-L1 level. No lower spinal cord or conus signal abnormality. No abnormal intradural enhancement. Normal cauda equina nerve roots. No lumbosacral dural thickening. Paraspinal and other soft tissues: Severe bladder distension. Oval metastatic left iliac lymph node up to 3.8 cm diameter (series 31, image 38). Other abdominal and pelvic viscera as on CT today. Disc levels: No lumbar epidural or foraminal tumor. Disc and endplate degeneration maximal at L3-L4 with mild degenerative foraminal stenosis at that level. IMPRESSION: 1. 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 Vertebral tumor with mild pathologic compression fractures of T3, T6, L1, and L3. 2. 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 Associated near complete occlusion of the right T6 neural foramen. And early extraosseous tumor extension suspected from the right T1 facet. 3. No other thoracic epidural tumor or spinal stenosis. 4. Severe bladder distension. 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 5. 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 Electronically Signed: By: Genevie Ann M.D. On: 08/19/2021 05:48   MR Lumbar Spine W Wo Contrast  Addendum Date: 08/19/2021   ADDENDUM REPORT: 08/19/2021 05:55 ADDENDUM: Salient findings discussed by telephone with Dr. Roxanne Mins in the Emergency Department at 0551 hours. Electronically Signed   By: Genevie Ann M.D.   On: 08/19/2021 05:55   Result Date: 08/19/2021 CLINICAL DATA:  61 year old male with abdominal  pain, evidence of osseous metastatic disease on CT Chest, Abdomen, and Pelvis today, including multiple lytic spine lesions. EXAM: MRI THORACIC AND LUMBAR SPINE WITHOUT AND WITH CONTRAST TECHNIQUE: Multiplanar and multiecho pulse sequences of the thoracic and lumbar spine were obtained without and with intravenous contrast.  CONTRAST:  15mL GADAVIST GADOBUTROL 1 MMOL/ML IV SOLN COMPARISON:  CT Chest, Abdomen, and Pelvis 0031 hours and 0303 hours today. FINDINGS: MRI THORACIC SPINE FINDINGS Limited cervical spine imaging:  Grossly negative. Thoracic spine segmentation:  Normal on the comparison. Alignment:  Maintained thoracic kyphosis. Vertebrae: Enhancing STIR hyperintense tumor in the right T1 facets, replacing the T3 vertebral body (additional details below, in the superior T4 vertebral body, and subtotally replacing the T6 vertebra (details below). No definite posterior rib metastasis. Cord: Moderate to severe cord compression at the T6 level (series 18, image 18). See further detailed below. Mild associated cord edema. Above and below that level spinal cord signal and morphology within normal limits. There is mild dural thickening and enhancement there (series 32, image 18), but no convincing intradural enhancement, no abnormal dural thickening or enhancement elsewhere. Conus medullaris appears normal at T12-L1. Paraspinal and other soft tissues: Early ventral and lateral paraspinal extension of tumor suspected at T6. See additional details below. And there might also be early extraosseous extension of tumor from the right T1 facets (series 7, image 2). Other thoracic and abdominal viscera as reported by CT earlier today. Disc levels: T3: Mild superior endplate pathologic compression fracture stable from the earlier CT. Early ventral epidural tumor (series 15, image 9) but no cord compression. No foraminal involvement. T5-T6: Bulky up to 25 mm long axis epidural tumor occupies the right spinal canal here extending to just above the T5-T6 disc space (series 15, image 7) with underlying subtotal T6 vertebral body tumor replacement including posterior elements on the right side. The partially cystic or necrotic epidural mass results in moderate to severe compression of the spinal cord into the anterior left canal (series 18, image 18)  plus some cord edema as above. There is also subtotal occlusion of the right T6 neural foramen, and mild stenosis of the right T5 foramen. Additional mild T6 pathologic inferior endplate compression was better demonstrated by CT today. No other thoracic epidural tumor or spinal stenosis. MRI LUMBAR SPINE FINDINGS Segmentation:  Normal, concordant with the numbering above. Alignment:  Preserved lumbar lordosis. Vertebrae: Subtotal replacement of the L1 and L3 vertebral bodies by STIR hyperintense heterogeneously enhancing tumor. Mild pathologic inferior compression at both levels as seen by CT today. Posterior elements of both levels appear spared. Large infiltrating tumor of the right iliac bone with posterior extraosseous extension (series 31, image 31 and series 26, image 1) abuts the right SI joint. Smaller round left medial iliac bone metastasis. Visible sacrum remains intact. Conus medullaris: Extends to the T12-L1 level. No lower spinal cord or conus signal abnormality. No abnormal intradural enhancement. Normal cauda equina nerve roots. No lumbosacral dural thickening. Paraspinal and other soft tissues: Severe bladder distension. Oval metastatic left iliac lymph node up to 3.8 cm diameter (series 31, image 38). Other abdominal and pelvic viscera as on CT today. Disc levels: No lumbar epidural or foraminal tumor. Disc and endplate degeneration maximal at L3-L4 with mild degenerative foraminal stenosis at that level. IMPRESSION: 1. 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 Vertebral tumor with mild pathologic compression fractures of T3, T6, L1, and L3. 2. Large, 2.5 cm epidural tumor extending from the T6 vertebra  occupies the right spinal canal at T5-T6 with Moderate To Severe Cord Compression, And Mild Spinal Cord Edema. Associated near complete occlusion of the right T6 neural foramen. And early extraosseous tumor extension suspected from the right T1 facet. 3. No other  thoracic epidural tumor or spinal stenosis. 4. Severe bladder distension. 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 5. 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 Electronically Signed: By: Genevie Ann M.D. On: 08/19/2021 05:48   DG C-Arm 1-60 Min-No Report  Result Date: 08/19/2021 CLINICAL DATA:  Status post T-spine laminectomy. EXAM: THORACIC SPINE 2 VIEWS; DG C-ARM 1-60 MIN-NO REPORT COMPARISON:  01/17/2022 FINDINGS: 2 images from portable C-arm radiography obtained in the operating room show changes from T5-6 and T6-7 laminectomy and T5-6 and T6-7 posterior-lateral arthrodesis. IMPRESSION: 1. Postoperative changes as above. Electronically Signed   By: Kerby Moors M.D.   On: 08/19/2021 14:16   DG C-Arm 1-60 Min-No Report  Result Date: 08/19/2021 CLINICAL DATA:  Status post T-spine laminectomy. EXAM: THORACIC SPINE 2 VIEWS; DG C-ARM 1-60 MIN-NO REPORT COMPARISON:  01/17/2022 FINDINGS: 2 images from portable C-arm radiography obtained in the operating room show changes from T5-6 and T6-7 laminectomy and T5-6 and T6-7 posterior-lateral arthrodesis. IMPRESSION: 1. Postoperative changes as above. Electronically Signed   By: Kerby Moors M.D.   On: 08/19/2021 14:16   DG C-Arm 1-60 Min-No Report  Result Date: 08/19/2021 Fluoroscopy was utilized by the requesting physician.  No radiographic interpretation.    Assessment/Plan: Postop day #1: The patient is much stronger.  He looks good.  Metastatic cancer: I will ask the oncologist to see the patient for treatment options.  LOS: 1 day     Ophelia Charter 08/20/2021, 7:54 AM     Patient ID: Clifford Jenkins, male   DOB: 07-08-1961, 61 y.o.   MRN: [id number]
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
## 2021-08-21 ENCOUNTER — Telehealth: Payer: Self-pay | Admitting: Oncology

## 2021-08-21 NOTE — Plan of Care (Signed)

## 2021-08-21 NOTE — Telephone Encounter (Signed)
Scheduled appt per 1/10 staff msg Dr. Alvy Bimler. Pt's significant other is aware of appt date and time.
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
## 2021-08-21 NOTE — Progress Notes (Signed)
Inpatient Rehab Admissions Coordinator:   Met with patient and his significant other at the bedside.  Reviewed again CIR recommendations as well as goals/expectations of CIR stay.  [name] states she works 4A-10A but can be home otherwise and that patient is usually sleeping during that time.  I reviewed I would need insurance authorization.  Will wait for completion of acute workup and see what treatment plan is to see when we need to request insurance auth.   Shann Medal, PT, DPT Admissions Coordinator [phone] 08/21/21  3:03 PM

## 2021-08-21 NOTE — Progress Notes (Signed)
Subjective: The patient is alert and pleasant.  He looks well.  He is having some mild constipation.  Objective: Vital signs in last 24 hours: Temp:  [98.2 F (36.8 C)-98.6 F (37 C)] 98.6 F (37 C) (01/10 0419) Pulse Rate:  [61-88] 61 (01/10 0419) Resp:  [16-18] 16 (01/10 0419) BP: (118-129)/(75-83) 121/78 (01/10 0419) SpO2:  [95 %-100 %] 100 % (01/10 0419) Estimated body mass index is 29.29 kg/m as calculated from the following:   Height as of this encounter: 5\' 7"  (1.702 m).   Weight as of this encounter: 84.8 kg.   Intake/Output from previous day: 01/09 0701 - 01/10 0700 In: 36 [I.V.:36] Out: 800 [Urine:800] Intake/Output this shift: No intake/output data recorded.  Physical exam the patient is alert and pleasant.  His lower extremity strength is much better.  He has walked.  Lab Results: Recent Labs    08/18/21 2355 08/19/21 0932 08/20/21 0429  WBC 10.8*  --  15.1*  HGB 15.6 13.3 13.0  HCT 46.8 39.0 38.8*  PLT 221  --  204   BMET Recent Labs    08/18/21 2355 08/19/21 0932 08/20/21 0429  NA 135 133* 135  K 3.9 4.4 4.4  CL 96*  --  98  CO2 26  --  28  GLUCOSE 134*  --  132*  BUN 9  --  12  CREATININE 0.91  --  0.90  CALCIUM 9.7  --  8.7*    Studies/Results: DG Thoracic Spine 2 View  Result Date: 08/19/2021 CLINICAL DATA:  Status post T-spine laminectomy. EXAM: THORACIC SPINE 2 VIEWS; DG C-ARM 1-60 MIN-NO REPORT COMPARISON:  01/17/2022 FINDINGS: 2 images from portable C-arm radiography obtained in the operating room show changes from T5-6 and T6-7 laminectomy and T5-6 and T6-7 posterior-lateral arthrodesis. IMPRESSION: 1. Postoperative changes as above. Electronically Signed   By: Kerby Moors M.D.   On: 08/19/2021 14:16   DG C-Arm 1-60 Min-No Report  Result Date: 08/19/2021 CLINICAL DATA:  Status post T-spine laminectomy. EXAM: THORACIC SPINE 2 VIEWS; DG C-ARM 1-60 MIN-NO REPORT COMPARISON:  01/17/2022 FINDINGS: 2 images from portable C-arm radiography  obtained in the operating room show changes from T5-6 and T6-7 laminectomy and T5-6 and T6-7 posterior-lateral arthrodesis. IMPRESSION: 1. Postoperative changes as above. Electronically Signed   By: Kerby Moors M.D.   On: 08/19/2021 14:16   DG C-Arm 1-60 Min-No Report  Result Date: 08/19/2021 CLINICAL DATA:  Status post T-spine laminectomy. EXAM: THORACIC SPINE 2 VIEWS; DG C-ARM 1-60 MIN-NO REPORT COMPARISON:  01/17/2022 FINDINGS: 2 images from portable C-arm radiography obtained in the operating room show changes from T5-6 and T6-7 laminectomy and T5-6 and T6-7 posterior-lateral arthrodesis. IMPRESSION: 1. Postoperative changes as above. Electronically Signed   By: Kerby Moors M.D.   On: 08/19/2021 14:16   DG C-Arm 1-60 Min-No Report  Result Date: 08/19/2021 Fluoroscopy was utilized by the requesting physician.  No radiographic interpretation.    Assessment/Plan: Spine tumor: I appreciate the oncology team's input.  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  We are awaiting rehab placement.  He is ready from my point of view.  LOS: 2 days     Ophelia Charter 08/21/2021, 7:45 AM     Patient ID: Clifford Jenkins, male   DOB: 1960/08/21, 61 y.o.   MRN: [id number]

## 2021-08-22 ENCOUNTER — Telehealth: Payer: Self-pay | Admitting: Radiation Oncology

## 2021-08-22 LAB — SURGICAL PATHOLOGY
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
## 2021-08-22 NOTE — Progress Notes (Signed)
° °  Providing Compassionate, Quality Care - Together   Subjective: Patient reports no new issues.  Objective: Vital signs in last 24 hours: Temp:  [98.1 F (36.7 C)-99.3 F (37.4 C)] 98.8 F (37.1 C) (01/11 1155) Pulse Rate:  [81-94] 90 (01/11 1155) Resp:  [15-17] 16 (01/11 1155) BP: (121-130)/(79-86) 121/79 (01/11 1155) SpO2:  [97 %-98 %] 98 % (01/11 1155)  Intake/Output from previous day: No intake/output data recorded. Intake/Output this shift: No intake/output data recorded.  Alert and oriented x 4 PERRLA CN II-XII grossly intact MAE, Strength and sensation continuing to improve Incision is covered with Honeycomb dressing and Steri Strips Dressing is dry and intact (dressing removed during assessment)   Lab Results: Recent Labs    08/20/21 0429  WBC 15.1*  HGB 13.0  HCT 38.8*  PLT 204   BMET Recent Labs    08/20/21 0429  NA 135  K 4.4  CL 98  CO2 28  GLUCOSE 132*  BUN 12  CREATININE 0.90  CALCIUM 8.7*    Studies/Results: No results found.  Assessment/Plan: Patient underwent T5-7 and T6-7 laminectomy for transpedicular resection of thoracic spine tumor, with T5 to T7 posterior instrumentation by Dr. Arnoldo Morale on 08/19/2021. The patient has done very well since surgery. His strength has improved dramatically. Pathology confirmed prostate metastasis. Patient is ready from a medical standpoint to discharge to CIR.   LOS: 3 days   -[place] [facility] once approval received from insurance. -Continue efforts at mobilization. -Surgical wound fine to remain OTA. Leave Steri Strips in place.   Viona Gilmore, DNP, AGNP-C Nurse Practitioner  [facility] [place] [address], [place], [place] [address] P: [phone]     F: [phone]  08/22/2021, 1:15 PM
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
## 2021-08-22 NOTE — Progress Notes (Signed)
Inpatient Rehab Admissions Coordinator:   Per oncology team plan for no further acute workup/treatment until after outpatient follow up with Dr. Alen Blew.  Will open insurance today.    Shann Medal, PT, DPT Admissions Coordinator [phone] 08/22/21  9:53 AM

## 2021-08-22 NOTE — Telephone Encounter (Signed)
Called Allegra Lai to schedule consultation with Dr. Tammi Klippel on Mr. Dimond's behalf. No answer, LVM with appointment date and time. Asked that she return my call to confirm.

## 2021-08-23 ENCOUNTER — Encounter (HOSPITAL_COMMUNITY): Payer: Self-pay | Admitting: Physical Medicine and Rehabilitation

## 2021-08-23 ENCOUNTER — Other Ambulatory Visit: Payer: Self-pay

## 2021-08-23 ENCOUNTER — Inpatient Hospital Stay (HOSPITAL_COMMUNITY)
Admission: RE | Admit: 2021-08-23 | Discharge: 2021-08-31 | DRG: 544 | Disposition: A | Discharge status: Home / Self Care | Payer: BC Managed Care – PPO | Source: Intra-hospital | Attending: Physical Medicine and Rehabilitation | Admitting: Physical Medicine and Rehabilitation

## 2021-08-23 ENCOUNTER — Telehealth: Payer: Self-pay | Admitting: Radiation Oncology

## 2021-08-23 DIAGNOSIS — Z6829 Body mass index (BMI) 29.0-29.9, adult: Secondary | ICD-10-CM

## 2021-08-23 DIAGNOSIS — Z79899 Other long term (current) drug therapy: Secondary | ICD-10-CM | POA: Diagnosis not present

## 2021-08-23 DIAGNOSIS — Z981 Arthrodesis status: Secondary | ICD-10-CM

## 2021-08-23 DIAGNOSIS — K5900 Constipation, unspecified: Secondary | ICD-10-CM | POA: Diagnosis present

## 2021-08-23 DIAGNOSIS — R591 Generalized enlarged lymph nodes: Secondary | ICD-10-CM | POA: Diagnosis present

## 2021-08-23 DIAGNOSIS — D492 Neoplasm of unspecified behavior of bone, soft tissue, and skin: Secondary | ICD-10-CM | POA: Diagnosis not present

## 2021-08-23 DIAGNOSIS — D72829 Elevated white blood cell count, unspecified: Secondary | ICD-10-CM | POA: Diagnosis present

## 2021-08-23 DIAGNOSIS — Z713 Dietary counseling and surveillance: Secondary | ICD-10-CM | POA: Diagnosis not present

## 2021-08-23 DIAGNOSIS — C61 Malignant neoplasm of prostate: Secondary | ICD-10-CM | POA: Diagnosis present

## 2021-08-23 DIAGNOSIS — R7401 Elevation of levels of liver transaminase levels: Secondary | ICD-10-CM | POA: Diagnosis present

## 2021-08-23 DIAGNOSIS — E669 Obesity, unspecified: Secondary | ICD-10-CM | POA: Diagnosis present

## 2021-08-23 DIAGNOSIS — G8918 Other acute postprocedural pain: Secondary | ICD-10-CM | POA: Diagnosis present

## 2021-08-23 DIAGNOSIS — F419 Anxiety disorder, unspecified: Secondary | ICD-10-CM | POA: Diagnosis present

## 2021-08-23 DIAGNOSIS — C7951 Secondary malignant neoplasm of bone: Principal | ICD-10-CM | POA: Diagnosis present

## 2021-08-23 DIAGNOSIS — I10 Essential (primary) hypertension: Secondary | ICD-10-CM | POA: Diagnosis present

## 2021-08-23 DIAGNOSIS — R609 Edema, unspecified: Secondary | ICD-10-CM | POA: Diagnosis not present

## 2021-08-23 MED ORDER — CYCLOBENZAPRINE HCL 10 MG PO TABS: 10.0000 mg | ORAL_TABLET | Freq: Three times a day (TID) | ORAL | Status: DC | PRN

## 2021-08-23 MED ORDER — OXYCODONE HCL 5 MG PO TABS
10.0000 mg | ORAL_TABLET | ORAL | Status: DC | PRN
  Administered 2021-08-24 – 2021-08-27 (×2): 10 mg via ORAL
  Filled 2021-08-23 (×4): qty 2

## 2021-08-23 MED ORDER — DOCUSATE SODIUM 100 MG PO CAPS: 100.0000 mg | ORAL_CAPSULE | Freq: Two times a day (BID) | ORAL | 0 refills | Status: DC

## 2021-08-23 MED ORDER — ACETAMINOPHEN 325 MG PO TABS: 650.0000 mg | ORAL_TABLET | ORAL | Status: DC | PRN

## 2021-08-23 MED ORDER — CYCLOBENZAPRINE HCL 10 MG PO TABS: 10.0000 mg | ORAL_TABLET | Freq: Three times a day (TID) | ORAL | 0 refills | Status: DC | PRN

## 2021-08-23 MED ORDER — AMLODIPINE BESYLATE 10 MG PO TABS
10.0000 mg | ORAL_TABLET | Freq: Every day | ORAL | Status: DC
  Administered 2021-08-24 – 2021-08-31 (×8): 10 mg via ORAL
  Filled 2021-08-23 (×8): qty 1

## 2021-08-23 MED ORDER — IRBESARTAN 75 MG PO TABS
150.0000 mg | ORAL_TABLET | Freq: Every day | ORAL | Status: DC
  Administered 2021-08-24 – 2021-08-31 (×8): 150 mg via ORAL
  Filled 2021-08-23 (×8): qty 2

## 2021-08-23 MED ORDER — OXYCODONE HCL 5 MG PO TABS: 5.0000 mg | ORAL_TABLET | ORAL | 0 refills | Status: DC | PRN

## 2021-08-23 MED ORDER — DOCUSATE SODIUM 100 MG PO CAPS
100.0000 mg | ORAL_CAPSULE | Freq: Two times a day (BID) | ORAL | Status: DC
  Administered 2021-08-23 – 2021-08-31 (×14): 100 mg via ORAL
  Filled 2021-08-23 (×17): qty 1

## 2021-08-23 MED ORDER — METHOCARBAMOL 500 MG PO TABS
500.0000 mg | ORAL_TABLET | Freq: Two times a day (BID) | ORAL | Status: DC
  Administered 2021-08-23 – 2021-08-31 (×16): 500 mg via ORAL
  Filled 2021-08-23 (×15): qty 1

## 2021-08-23 NOTE — Telephone Encounter (Signed)
Second attempt: Clifford Jenkins to schedule consultation with Dr. Tammi Klippel on Clifford Jenkins's behalf. No answer, LVM with appointment date and time. Asked that she return my call to confirm.

## 2021-08-23 NOTE — Plan of Care (Signed)
°  Problem: Education: Goal: Knowledge of General Education information will improve Description: Including pain rating scale, medication(s)/side effects and non-pharmacologic comfort measures Outcome: Progressing   Problem: Health Behavior/Discharge Planning: Goal: Ability to manage health-related needs will improve Outcome: Progressing   Problem: Activity: Goal: Risk for activity intolerance will decrease Outcome: Progressing   Problem: Pain Managment: Goal: General experience of comfort will improve Outcome: Progressing   Problem: Safety: Goal: Ability to remain free from injury will improve Outcome: Progressing   Problem: Skin Integrity: Goal: Risk for impaired skin integrity will decrease Outcome: Progressing   Problem: Safety: Goal: Ability to remain free from injury will improve Outcome: Progressing   Problem: Skin Integrity: Goal: Risk for impaired skin integrity will decrease Outcome: Progressing

## 2021-08-23 NOTE — Progress Notes (Signed)
Inpatient Rehabilitation  Patient information reviewed and entered into eRehab system by Anni Hocevar M. Thailan Sava, M.A., CCC/SLP, PPS Coordinator.  Information including medical coding, functional ability and quality indicators will be reviewed and updated through discharge.    
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
## 2021-08-23 NOTE — Discharge Summary (Signed)
Physician Discharge Summary     Providing Compassionate, Quality Care - Together   Patient ID: Clifford Jenkins MRN: [id number] DOB/AGE: 59-Jan-1962 59 y.o.  Admit date: 08/18/2021 Discharge date: 08/23/2021  Admission Diagnoses: Thoracic spine tumor  Discharge Diagnoses:  Principal Problem:   Thoracic spine tumor   Discharged Condition: good  Hospital Course: Patient underwent a T5-7 and T6-7 laminectomy for transpedicular resection of thoracic spine tumor, with T5 to T7 posterior instrumentation by Dr. Arnoldo Morale on 08/19/2021. He was admitted to 3W09 following recovery from anesthesia in the PACU. His postoperative course has been uncomplicated. He has worked with both physical and occupational therapies who feel the patient would benefit from continued rehabilitation in an acute setting. He is tolerating a normal diet. He is not having any bowel or bladder dysfunction. His pain is well-controlled with oral pain medication. He is ready for discharge to CIR.   Consults: rehabilitation medicine and oncology  Significant Diagnostic Studies: radiology: DG Thoracic Spine 2 View  Result Date: 08/19/2021 CLINICAL DATA:  Status post T-spine laminectomy. EXAM: THORACIC SPINE 2 VIEWS; DG C-ARM 1-60 MIN-NO REPORT COMPARISON:  01/17/2022 FINDINGS: 2 images from portable C-arm radiography obtained in the operating room show changes from T5-6 and T6-7 laminectomy and T5-6 and T6-7 posterior-lateral arthrodesis. IMPRESSION: 1. Postoperative changes as above. Electronically Signed   By: Kerby Moors M.D.   On: 08/19/2021 14:16   DG C-Arm 1-60 Min-No Report  Result Date: 08/19/2021 CLINICAL DATA:  Status post T-spine laminectomy. EXAM: THORACIC SPINE 2 VIEWS; DG C-ARM 1-60 MIN-NO REPORT COMPARISON:  01/17/2022 FINDINGS: 2 images from portable C-arm radiography obtained in the operating room show changes from T5-6 and T6-7 laminectomy and T5-6 and T6-7 posterior-lateral arthrodesis. IMPRESSION: 1.  Postoperative changes as above. Electronically Signed   By: Kerby Moors M.D.   On: 08/19/2021 14:16   DG C-Arm 1-60 Min-No Report  Result Date: 08/19/2021 CLINICAL DATA:  Status post T-spine laminectomy. EXAM: THORACIC SPINE 2 VIEWS; DG C-ARM 1-60 MIN-NO REPORT COMPARISON:  01/17/2022 FINDINGS: 2 images from portable C-arm radiography obtained in the operating room show changes from T5-6 and T6-7 laminectomy and T5-6 and T6-7 posterior-lateral arthrodesis. IMPRESSION: 1. Postoperative changes as above. Electronically Signed   By: Kerby Moors M.D.   On: 08/19/2021 14:16   DG C-Arm 1-60 Min-No Report  Result Date: 08/19/2021 Fluoroscopy was utilized by the requesting physician.  No radiographic interpretation.     and Pathology:   SURGICAL PATHOLOGY  CASE: [phone]  PATIENT: Clifford Jenkins  Surgical Pathology Report   Clinical History: Thoracic tumor (nt)   FINAL MICROSCOPIC DIAGNOSIS:   A. BONE, THORACIC SPINE, BIOPSY:  - Diagnostic of malignancy.  - Metastatic carcinoma, compatible with prostate origin.  - See comment.   COMMENT:  Immunohistochemical stain for PSA is positive.    Treatments: surgery: T5-6 and T6-7 laminectomy for transpedicular resection of thoracic spine tumor using microdissection; T5-6 and T6-7 posterior lateral arthrodesis with interval probe bone graft extender; T5-T7 posterior instrumentation with globus titanium pedicle screws and rods.   Discharge Exam: Blood pressure 117/83, pulse 95, temperature 98.4 F (36.9 C), temperature source Oral, resp. rate 17, height 5\' 7"  (1.702 m), weight 84.8 kg, SpO2 100 %.  Alert and oriented x 4 PERRLA CN II-XII grossly intact MAE, Strength and sensation continuing to improve Incision is clean, dry, and intact   Disposition: Discharge disposition: [place] Not Defined        Allergies as of 08/23/2021  No Known Allergies      Medication List     STOP taking these  medications    ibuprofen 200 MG tablet Commonly known as: ADVIL   methocarbamol 500 MG tablet Commonly known as: ROBAXIN       TAKE these medications    acetaminophen 325 MG tablet Commonly known as: TYLENOL Take 2 tablets (650 mg total) by mouth every 4 (four) hours as needed for mild pain ((score 1 to 3) or temp > 100.5).   amLODipine 10 MG tablet Commonly known as: NORVASC Take 10 mg by mouth daily.   cyclobenzaprine 10 MG tablet Commonly known as: FLEXERIL Take 1 tablet (10 mg total) by mouth 3 (three) times daily as needed for muscle spasms.   docusate sodium 100 MG capsule Commonly known as: COLACE Take 1 capsule (100 mg total) by mouth 2 (two) times daily.   oxyCODONE 5 MG immediate release tablet Commonly known as: Oxy IR/ROXICODONE Take 1 tablet (5 mg total) by mouth every 3 (three) hours as needed for moderate pain ((score 4 to 6)).   valsartan 160 MG tablet Commonly known as: DIOVAN Take 160 mg by mouth daily.        Follow-up Information     Newman Pies, MD. Schedule an appointment as soon as possible for a visit in 3 week(s).   Specialty: Neurosurgery Contact information: [address] [place] [address] [phone]                 Signed: Viona Gilmore, DNP, AGNP-C Nurse Practitioner  [facility] [place] [address], [place],  [address] P: [phone]     F: [phone]  08/23/2021, 10:28 AM

## 2021-08-23 NOTE — Discharge Instructions (Addendum)
Inpatient Rehab Discharge Instructions  Clifford Jenkins Discharge date and time: No discharge date for patient encounter.   Activities/Precautions/ Functional Status: Activity: Back brace when out of bed Diet: Regular Wound Care: Routine skin checks Functional status:  ___ No restrictions     ___ Walk up steps independently ___ 24/7 supervision/assistance   ___ Walk up steps with assistance ___ Intermittent supervision/assistance  ___ Bathe/dress independently ___ Walk with walker     __x_ Bathe/dress with assistance ___ Walk Independently    ___ Shower independently ___ Walk with assistance    ___ Shower with assistance ___ No alcohol     ___ Return to work/school ________   COMMUNITY REFERRALS UPON DISCHARGE:     Outpatient: PT     OT                 Agency:[facility]     Phone:[phone]              Appointment Date/Time:*Please expect follow-up within 7-10 business days to schedule. If you have not received follow-up, be sure to contact the site directly.*  Medical Equipment/Items Ordered: 3in1 bedside commode, wheelchair, and rolling walker                                                 Agency/Supplier: [employer] [facility] [phone]    Special Instructions: No driving smoking or alcohol  Follow-up Dr. Alen Blew oncology services for plan of care [phone]   My questions have been answered and I understand these instructions. I will adhere to these goals and the provided educational materials after my discharge from the hospital.  Patient/Caregiver Signature _______________________________ Date __________  Clinician Signature _______________________________________ Date __________  Please bring this form and your medication list with you to all your follow-up doctor's appointments.

## 2021-08-23 NOTE — Plan of Care (Signed)

## 2021-08-23 NOTE — Progress Notes (Signed)
Pt arrived to unit via w/c. Pt is alert and oriented and able to make needs known. No orders at this time, notified PA.  Oriented pt to rehab.

## 2021-08-23 NOTE — Progress Notes (Signed)
Inpatient Rehab Admissions Coordinator:    I have insurance approval and a bed available for pt to admit to CIR today. Viona Gilmore, NP, in agreement.  Will let pt/family and TOC team know.   Shann Medal, PT, DPT Admissions Coordinator [phone] 08/23/21  10:02 AM

## 2021-08-23 NOTE — TOC Transition Note (Signed)
Transition of Care [facility]) - CM/SW Discharge Note   Patient Details  Name: Clifford Jenkins MRN: [id number] Date of Birth: 1961-03-27  [facility] of [facility]) CM/SW Contact:  Pollie Friar, RN Phone Number: 08/23/2021, 10:42 AM   Clinical Narrative:    Patient discharging to CIR today. CM signing off.    Final next level of care: IP Rehab Facility Barriers to Discharge: No Barriers Identified   Patient Goals and CMS Choice     Choice offered to / list presented to : Patient  Discharge Placement                       Discharge Plan and Services                                     Social Determinants of Health (SDOH) Interventions     Readmission Risk Interventions No flowsheet data found.
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
## 2021-08-23 NOTE — Progress Notes (Signed)
Subjective: The patient is alert and pleasant.  He is in no apparent distress.  He looks well.  Objective: Vital signs in last 24 hours: Temp:  [98.1 F (36.7 C)-100.1 F (37.8 C)] 98.9 F (37.2 C) (01/12 0445) Pulse Rate:  [81-90] 87 (01/12 0348) Resp:  [16-19] 16 (01/12 0348) BP: (117-123)/(72-83) 117/78 (01/12 0348) SpO2:  [95 %-98 %] 95 % (01/12 0348) Estimated body mass index is 29.29 kg/m as calculated from the following:   Height as of this encounter: 5\' 7"  (1.702 m).   Weight as of this encounter: 84.8 kg.   Intake/Output from previous day: No intake/output data recorded. Intake/Output this shift: No intake/output data recorded.  Physical exam the patient is alert and oriented.  His strength is grossly normal in his bilateral gastrocnemius and dorsiflexors.  Lab Results: No results for input(s): WBC, HGB, HCT, PLT in the last 72 hours. BMET No results for input(s): NA, K, CL, CO2, GLUCOSE, BUN, CREATININE, CALCIUM in the last 72 hours.  Studies/Results: No results found.  Assessment/Plan: Postop day #4: The patient is progressing well.  We are awaiting rehab placement.  He is ready.  LOS: 4 days     Ophelia Charter 08/23/2021, 7:57 AM     Patient ID: Clifford Jenkins, male   DOB: [birth year], 61 y.o.   MRN: [id number]

## 2021-08-24 ENCOUNTER — Inpatient Hospital Stay (HOSPITAL_COMMUNITY): Payer: BC Managed Care – PPO

## 2021-08-24 DIAGNOSIS — C7951 Secondary malignant neoplasm of bone: Principal | ICD-10-CM

## 2021-08-24 DIAGNOSIS — C61 Malignant neoplasm of prostate: Secondary | ICD-10-CM | POA: Diagnosis not present

## 2021-08-24 DIAGNOSIS — R609 Edema, unspecified: Secondary | ICD-10-CM

## 2021-08-24 LAB — CBC WITH DIFFERENTIAL/PLATELET
Abs Immature Granulocytes: 0.14 10*3/uL — ABNORMAL HIGH (ref 0.00–0.07)
Basophils Absolute: 0 10*3/uL (ref 0.0–0.1)
Basophils Relative: 0 %
Eosinophils Absolute: 0.1 10*3/uL (ref 0.0–0.5)
Eosinophils Relative: 0 %
HCT: 37.1 % — ABNORMAL LOW (ref 39.0–52.0)
Hemoglobin: 12.8 g/dL — ABNORMAL LOW (ref 13.0–17.0)
Immature Granulocytes: 1 %
Lymphocytes Relative: 12 %
Lymphs Abs: 1.8 10*3/uL (ref 0.7–4.0)
MCH: 30.8 pg (ref 26.0–34.0)
MCHC: 34.5 g/dL (ref 30.0–36.0)
MCV: 89.2 fL (ref 80.0–100.0)
Monocytes Absolute: 2.1 10*3/uL — ABNORMAL HIGH (ref 0.1–1.0)
Monocytes Relative: 14 %
Neutro Abs: 11.1 10*3/uL — ABNORMAL HIGH (ref 1.7–7.7)
Neutrophils Relative %: 73 %
Platelets: 198 10*3/uL (ref 150–400)
RBC: 4.16 MIL/uL — ABNORMAL LOW (ref 4.22–5.81)
RDW: 12 % (ref 11.5–15.5)
WBC: 15.2 10*3/uL — ABNORMAL HIGH (ref 4.0–10.5)
nRBC: 0 % (ref 0.0–0.2)

## 2021-08-24 LAB — COMPREHENSIVE METABOLIC PANEL
ALT: 81 U/L — ABNORMAL HIGH (ref 0–44)
AST: 34 U/L (ref 15–41)
Albumin: 2.6 g/dL — ABNORMAL LOW (ref 3.5–5.0)
Alkaline Phosphatase: 150 U/L — ABNORMAL HIGH (ref 38–126)
Anion gap: 10 (ref 5–15)
BUN: 12 mg/dL (ref 6–20)
CO2: 27 mmol/L (ref 22–32)
Calcium: 8.6 mg/dL — ABNORMAL LOW (ref 8.9–10.3)
Chloride: 99 mmol/L (ref 98–111)
Creatinine, Ser: 0.99 mg/dL (ref 0.61–1.24)
GFR, Estimated: >60 mL/min (ref >60)
Glucose, Bld: 111 mg/dL — ABNORMAL HIGH (ref 70–99)
Potassium: 3.8 mmol/L (ref 3.5–5.1)
Sodium: 136 mmol/L (ref 135–145)
Total Bilirubin: 1 mg/dL (ref 0.3–1.2)
Total Protein: 6.4 g/dL — ABNORMAL LOW (ref 6.5–8.1)

## 2021-08-24 MED ORDER — POTASSIUM CHLORIDE 20 MEQ PO PACK
20.0000 meq | PACK | Freq: Once | ORAL | Status: AC
  Administered 2021-08-24: 20 meq via ORAL
  Filled 2021-08-24: qty 1

## 2021-08-24 MED ORDER — ACETAMINOPHEN 325 MG PO TABS: 650.0000 mg | ORAL_TABLET | Freq: Four times a day (QID) | ORAL | Status: DC | PRN

## 2021-08-24 NOTE — Plan of Care (Signed)
°  Problem: RH Balance Goal: LTG Patient will maintain dynamic sitting balance (PT) Description: LTG:  Patient will maintain dynamic sitting balance with assistance during mobility activities (PT) Flowsheets (Taken 08/24/2021 1223) LTG: Pt will maintain dynamic sitting balance during mobility activities with:: Independent with assistive device  Goal: LTG Patient will maintain dynamic standing balance (PT) Description: LTG:  Patient will maintain dynamic standing balance with assistance during mobility activities (PT) Flowsheets (Taken 08/24/2021 1223) LTG: Pt will maintain dynamic standing balance during mobility activities with:: Supervision/Verbal cueing   Problem: Sit to Stand Goal: LTG:  Patient will perform sit to stand with assistance level (PT) Description: LTG:  Patient will perform sit to stand with assistance level (PT) Flowsheets (Taken 08/24/2021 1223) LTG: PT will perform sit to stand in preparation for functional mobility with assistance level: Supervision/Verbal cueing   Problem: RH Bed Mobility Goal: LTG Patient will perform bed mobility with assist (PT) Description: LTG: Patient will perform bed mobility with assistance, with/without cues (PT). Flowsheets (Taken 08/24/2021 1223) LTG: Pt will perform bed mobility with assistance level of: Independent with assistive device    Problem: RH Bed to Chair Transfers Goal: LTG Patient will perform bed/chair transfers w/assist (PT) Description: LTG: Patient will perform bed to chair transfers with assistance (PT). Flowsheets (Taken 08/24/2021 1223) LTG: Pt will perform Bed to Chair Transfers with assistance level: Supervision/Verbal cueing   Problem: RH Car Transfers Goal: LTG Patient will perform car transfers with assist (PT) Description: LTG: Patient will perform car transfers with assistance (PT). Flowsheets (Taken 08/24/2021 1223) LTG: Pt will perform car transfers with assist:: Supervision/Verbal cueing   Problem: RH Furniture  Transfers Goal: LTG Patient will perform furniture transfers w/assist (OT/PT) Description: LTG: Patient will perform furniture transfers  with assistance (OT/PT). Flowsheets (Taken 08/24/2021 1223) LTG: Pt will perform furniture transfers with assist:: Supervision/Verbal cueing   Problem: RH Ambulation Goal: LTG Patient will ambulate in controlled environment (PT) Description: LTG: Patient will ambulate in a controlled environment, # of feet with assistance (PT). Flowsheets (Taken 08/24/2021 1223) LTG: Pt will ambulate in controlled environ  assist needed:: Supervision/Verbal cueing LTG: Ambulation distance in controlled environment: 150 ft with LRAD Goal: LTG Patient will ambulate in home environment (PT) Description: LTG: Patient will ambulate in home environment, # of feet with assistance (PT). Flowsheets (Taken 08/24/2021 1223) LTG: Pt will ambulate in home environ  assist needed:: Supervision/Verbal cueing LTG: Ambulation distance in home environment: 75 ft with LRAD   Problem: RH Wheelchair Mobility Goal: LTG Patient will propel w/c in controlled environment (PT) Description: LTG: Patient will propel wheelchair in controlled environment, # of feet with assist (PT) Flowsheets (Taken 08/24/2021 1223) LTG: Pt will propel w/c in controlled environ  assist needed:: Independent with assistive device LTG: Propel w/c distance in controlled environment: 150 ft Goal: LTG Patient will propel w/c in home environment (PT) Description: LTG: Patient will propel wheelchair in home environment, # of feet with assistance (PT). Flowsheets (Taken 08/24/2021 1223) LTG: Pt will propel w/c in home environ  assist needed:: Independent with assistive device LTG: Propel w/c distance in home environment: 75 ft Goal: LTG Patient will propel w/c in community environment (PT) Description: LTG: Patient will propel wheelchair in community environment, # of feet with assist (PT) Flowsheets (Taken 08/24/2021  1223) LTG: Pt will propel w/c in community environ  assist needed:: Independent with assistive device [id number] w/c distance in community environment: 300 ft
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
## 2021-08-24 NOTE — Plan of Care (Signed)
Problem: RH Balance Goal: LTG Patient will maintain dynamic standing with ADLs (OT) Description: LTG:  Patient will maintain dynamic standing balance with assist during activities of daily living (OT)  Flowsheets (Taken 08/24/2021 1236) LTG: Pt will maintain dynamic standing balance during ADLs with: Supervision/Verbal cueing   Problem: Sit to Stand Goal: LTG:  Patient will perform sit to stand in prep for activites of daily living with assistance level (OT) Description: LTG:  Patient will perform sit to stand in prep for activites of daily living with assistance level (OT) Flowsheets (Taken 08/24/2021 1236) LTG: PT will perform sit to stand in prep for activites of daily living with assistance level: Supervision/Verbal cueing   Problem: RH Grooming Goal: LTG Patient will perform grooming w/assist,cues/equip (OT) Description: LTG: Patient will perform grooming with assist, with/without cues using equipment (OT) Flowsheets (Taken 08/24/2021 1236) LTG: Pt will perform grooming with assistance level of: Set up assist    Problem: RH Bathing Goal: LTG Patient will bathe all body parts with assist levels (OT) Description: LTG: Patient will bathe all body parts with assist levels (OT) Flowsheets (Taken 08/24/2021 1236) LTG: Pt will perform bathing with assistance level/cueing: Set up assist    Problem: RH Dressing Goal: LTG Patient will perform upper body dressing (OT) Description: LTG Patient will perform upper body dressing with assist, with/without cues (OT). Flowsheets (Taken 08/24/2021 1236) LTG: Pt will perform upper body dressing with assistance level of: Set up assist Goal: LTG Patient will perform lower body dressing w/assist (OT) Description: LTG: Patient will perform lower body dressing with assist, with/without cues in positioning using equipment (OT) Flowsheets (Taken 08/24/2021 1236) LTG: Pt will perform lower body dressing with assistance level of: Supervision/Verbal cueing    Problem: RH Toileting Goal: LTG Patient will perform toileting task (3/3 steps) with assistance level (OT) Description: LTG: Patient will perform toileting task (3/3 steps) with assistance level (OT)  Flowsheets (Taken 08/24/2021 1236) LTG: Pt will perform toileting task (3/3 steps) with assistance level: Supervision/Verbal cueing   Problem: RH Toilet Transfers Goal: LTG Patient will perform toilet transfers w/assist (OT) Description: LTG: Patient will perform toilet transfers with assist, with/without cues using equipment (OT) Flowsheets (Taken 08/24/2021 1236) LTG: Pt will perform toilet transfers with assistance level of: Supervision/Verbal cueing   Problem: RH Tub/Shower Transfers Goal: LTG Patient will perform tub/shower transfers w/assist (OT) Description: LTG: Patient will perform tub/shower transfers with assist, with/without cues using equipment (OT) Flowsheets (Taken 08/24/2021 1236) LTG: Pt will perform tub/shower stall transfers with assistance level of: Supervision/Verbal cueing   Problem: RH Balance Goal: LTG Patient will maintain dynamic standing with ADLs (OT) Description: LTG:  Patient will maintain dynamic standing balance with assist during activities of daily living (OT)  Flowsheets (Taken 08/24/2021 1236) LTG: Pt will maintain dynamic standing balance during ADLs with: Supervision/Verbal cueing   Problem: Sit to Stand Goal: LTG:  Patient will perform sit to stand in prep for activites of daily living with assistance level (OT) Description: LTG:  Patient will perform sit to stand in prep for activites of daily living with assistance level (OT) Flowsheets (Taken 08/24/2021 1236) LTG: PT will perform sit to stand in prep for activites of daily living with assistance level: Supervision/Verbal cueing   Problem: RH Grooming Goal: LTG Patient will perform grooming w/assist,cues/equip (OT) Description: LTG: Patient will perform grooming with assist, with/without cues using  equipment (OT) Flowsheets (Taken 08/24/2021 1236) LTG: Pt will perform grooming with assistance level of: Set up assist    Problem: RH  Bathing Goal: LTG Patient will bathe all body parts with assist levels (OT) Description: LTG: Patient will bathe all body parts with assist levels (OT) Flowsheets (Taken 08/24/2021 1236) LTG: Pt will perform bathing with assistance level/cueing: Set up assist    Problem: RH Dressing Goal: LTG Patient will perform upper body dressing (OT) Description: LTG Patient will perform upper body dressing with assist, with/without cues (OT). Flowsheets (Taken 08/24/2021 1236) LTG: Pt will perform upper body dressing with assistance level of: Set up assist Goal: LTG Patient will perform lower body dressing w/assist (OT) Description: LTG: Patient will perform lower body dressing with assist, with/without cues in positioning using equipment (OT) Flowsheets (Taken 08/24/2021 1236) LTG: Pt will perform lower body dressing with assistance level of: Supervision/Verbal cueing   Problem: RH Toileting Goal: LTG Patient will perform toileting task (3/3 steps) with assistance level (OT) Description: LTG: Patient will perform toileting task (3/3 steps) with assistance level (OT)  Flowsheets (Taken 08/24/2021 1236) LTG: Pt will perform toileting task (3/3 steps) with assistance level: Supervision/Verbal cueing   Problem: RH Toilet Transfers Goal: LTG Patient will perform toilet transfers w/assist (OT) Description: LTG: Patient will perform toilet transfers with assist, with/without cues using equipment (OT) Flowsheets (Taken 08/24/2021 1236) LTG: Pt will perform toilet transfers with assistance level of: Supervision/Verbal cueing   Problem: RH Tub/Shower Transfers Goal: LTG Patient will perform tub/shower transfers w/assist (OT) Description: LTG: Patient will perform tub/shower transfers with assist, with/without cues using equipment (OT) Flowsheets (Taken 08/24/2021 1236) LTG:  Pt will perform tub/shower stall transfers with assistance level of: Supervision/Verbal cueing

## 2021-08-24 NOTE — Progress Notes (Signed)
PROGRESS NOTE   Subjective/Complaints: Tearful and emotional this morning as he misses his family and co-workers WBC stable Hgb dropped slightly  ROS: +spinal pain  Objective:   No results found. Recent Labs    08/24/21 0510  WBC 15.2*  HGB 12.8*  HCT 37.1*  PLT 198   Recent Labs    08/24/21 0510  NA 136  K 3.8  CL 99  CO2 27  GLUCOSE 111*  BUN 12  CREATININE 0.99  CALCIUM 8.6*    Intake/Output Summary (Last 24 hours) at 08/24/2021 1305 Last data filed at 08/24/2021 0855 Gross per 24 hour  Intake 120 ml  Output --  Net 120 ml        Physical Exam: Vital Signs Blood pressure 117/70, pulse 85, temperature 99.5 F (37.5 C), resp. rate 16, height 5\' 7"  (1.702 m), weight 79.5 kg, SpO2 96 %. Gen: no distress, normal appearing HEENT: oral mucosa pink and moist, NCAT Cardio: Reg rate Chest: normal effort, normal rate of breathing Abd: soft, non-distended Ext: no edema Psych: emotional today about missing his family and coworkers Skin: intact Neuro: Alert and oriented x4 Musculoskeletal: Thoracic brace in place. Moving lower extremities with full strength   Assessment/Plan: 1. Functional deficits which require 3+ hours per day of interdisciplinary therapy in a comprehensive inpatient rehab setting. Physiatrist is providing close team supervision and 24 hour management of active medical problems listed below. Physiatrist and rehab team continue to assess barriers to discharge/monitor patient progress toward functional and medical goals  Care Tool:  Bathing    Body parts bathed by patient: Right arm, Left arm, Chest, Abdomen, Buttocks, Front perineal area, Right upper leg, Left upper leg, Right lower leg, Left lower leg, Face         Bathing assist Assist Level: Supervision/Verbal cueing     Upper Body Dressing/Undressing Upper body dressing   What is the patient wearing?: Pull over shirt,  Orthosis    Upper body assist Assist Level: Supervision/Verbal cueing    Lower Body Dressing/Undressing Lower body dressing      What is the patient wearing?: Underwear/pull up, Pants     Lower body assist Assist for lower body dressing: Contact Guard/Touching assist     Toileting Toileting Toileting Activity did not occur (Clothing management and hygiene only): N/A (no void or bm)  Toileting assist Assist for toileting: Minimal Assistance - Patient > 75%     Transfers Chair/bed transfer  Transfers assist     Chair/bed transfer assist level: Minimal Assistance - Patient > 75%     Locomotion Ambulation   Ambulation assist      Assist level: Minimal Assistance - Patient > 75% Assistive device: Walker-rolling Max distance: 100'   Walk 10 feet activity   Assist     Assist level: Minimal Assistance - Patient > 75% Assistive device: Walker-rolling   Walk 50 feet activity   Assist    Assist level: Minimal Assistance - Patient > 75% Assistive device: Walker-rolling    Walk 150 feet activity   Assist Walk 150 feet activity did not occur: Safety/medical concerns         Walk 10 feet on uneven  surface  activity   Assist Walk 10 feet on uneven surfaces activity did not occur: Safety/medical concerns         Wheelchair     Assist Is the patient using a wheelchair?: No Type of Wheelchair: Manual    Wheelchair assist level: Supervision/Verbal cueing Max wheelchair distance: 150'    Wheelchair 50 feet with 2 turns activity    Assist        Assist Level: Supervision/Verbal cueing   Wheelchair 150 feet activity     Assist      Assist Level: Supervision/Verbal cueing   Blood pressure 117/70, pulse 85, temperature 99.5 F (37.5 C), resp. rate 16, height 5\' 7"  (1.702 m), weight 79.5 kg, SpO2 96 %.      Medical Problem List and Plan: 1. Functional deficits secondary to stage IV metastatic prostate cancer/lytic and  sclerotic bone lesions/cord compression/iliac lymphadenopathy.  Status post T5-6 T6-7 laminectomy transpedicular resection of thoracic spine tumor, T5-6 T6-7 posterior lateral arthrodesis, T5-7 posterior instrumentation pedicle screws and rods 08/19/2021.  Back brace when out of bed.  Patient will follow-up outpatient with Dr. Alen Blew as well as medical oncology             -patient may shower but incision must be covered             -ELOS/Goals: 5-10 days modi             Initial CIR evals today.  2.  Antithrombotics: -DVT/anticoagulation:  Mechanical: Antiembolism stockings, thigh (TED hose) Bilateral lower extremities check vascular study             -antiplatelet therapy: N/A 3.Postoperative pain: continue Robaxin 500 mg twice daily, oxycodone and Flexeril as needed. Will d/c tylenol given elevated ALT. 4. Situational anxiety: Lavender essential oil applied to pillow. Provide emotional support             -antipsychotic agents: N/A 5. Neuropsych: This patient is capable of making decisions on his own behalf. 6. Skin/Wound Care: Routine skin checks 7. Fluids/Electrolytes/Nutrition: Routine in and outs with follow-up chemistries 8.  Hypertension.  Continue Norvasc 10 mg daily, Avapro 150 mg daily.  Monitor with increased mobility 9.  Constipation.  Continue colace 100mg  BID 10. Overweight BMI 29.29: provide dietary education 11. Elevated ALT: d/c Tylenol  LOS: 1 days A FACE TO FACE EVALUATION WAS PERFORMED  Charnika Herbst P Torria Fromer 08/24/2021, 1:05 PM

## 2021-08-24 NOTE — Progress Notes (Signed)
Inpatient Rehabilitation Admission Medication Review by a Pharmacist  A complete drug regimen review was completed for this patient to identify any potential clinically significant medication issues.  High Risk Drug Classes Is patient taking? Indication by Medication  Antipsychotic No   Anticoagulant No   Antibiotic No   Opioid Yes Oxycodone for pain s/p back surgery.  Antiplatelet No   Hypoglycemics/insulin No   Vasoactive Medication Yes Norvasc, Irbesartan for HTN  Chemotherapy No   Other No      Clinically significant medication issues were identified that warrant physician communication and completion of prescribed/recommended actions by midnight of the next day:  No  Time spent performing this drug regimen review (minutes):  83min  Laneya Gasaway S. Alford Highland, PharmD, BCPS Clinical Staff Pharmacist [email or web address] [place] 08/24/2021 8:08 AM

## 2021-08-24 NOTE — IPOC Note (Signed)
Overall Plan of Care [facility]) Patient Details Name: Clifford Jenkins MRN: [id number] DOB: [birth year]  Admitting Diagnosis: Prostate cancer metastatic to bone [facility])  Hospital Problems: Principal Problem:   Prostate cancer metastatic to bone [facility])     Functional Problem List: Nursing Edema, Endurance, Medication Management, Safety, Skin Integrity  PT Balance, Endurance, Motor, Safety, Sensory  OT Balance, Endurance, Motor, Safety  SLP    TR         Basic ADLs: OT Grooming, Bathing, Dressing, Toileting     Advanced  ADLs: OT Simple Meal Preparation     Transfers: PT Bed Mobility, Bed to Chair, Car, [employer], Futures trader, Metallurgist: PT Ambulation, Emergency planning/management officer, Stairs     Additional Impairments: OT None  SLP        TR      Anticipated Outcomes Item Anticipated Outcome  Self Feeding No goal  Swallowing      Basic self-care  Media planner Transfers Supervision  Bowel/Bladder  n/a  Transfers  Supervision  Locomotion  Supervision with LRAD  Communication     Cognition     Pain  n/a  Safety/Judgment  supervision and no falls   Therapy Plan: PT Intensity: Minimum of 1-2 x/day ,45 to 90 minutes PT Frequency: 5 out of 7 days PT Duration Estimated Length of Stay: 7-10 days OT Intensity: Minimum of 1-2 x/day, 45 to 90 minutes OT Frequency: 5 out of 7 days OT Duration/Estimated Length of Stay: 7-10 days     Due to the current state of emergency, patients may not be receiving their 3-hours of Medicare-mandated therapy.   Team Interventions: Nursing Interventions Patient/Family Education, Disease Management/Prevention, Medication Management, Skin Care/Wound Management, Discharge Planning  PT interventions Ambulation/gait training, Training and development officer, Community reintegration, Discharge planning, Disease management/prevention, DME/adaptive equipment instruction, Functional mobility  training, Neuromuscular re-education, Patient/family education, Psychosocial support, Splinting/orthotics, Stair training, Therapeutic Activities, Therapeutic Exercise, UE/LE Strength taining/ROM, UE/LE Coordination activities, Wheelchair propulsion/positioning  OT Interventions Balance/vestibular training, Disease mangement/prevention, Neuromuscular re-education, Self Care/advanced ADL retraining, Therapeutic Exercise, UE/LE Strength taining/ROM, Pain management, DME/adaptive equipment instruction, Community reintegration, Barrister's clerk education, UE/LE Coordination activities, Therapeutic Activities, Psychosocial support, Functional mobility training, Discharge planning  SLP Interventions    TR Interventions    SW/CM Interventions Discharge Planning, Psychosocial Support, Patient/Family Education   Barriers to Discharge MD  Medical stability  Nursing Decreased caregiver support, Home environment access/layout, Wound Care, Lack of/limited family support, Weight, Weight bearing restrictions, Medication compliance Lives in 1 level home, 1 step, no rails. Partner can provide assist at discharge, works 4 am- 10 am.  PT Decreased caregiver support, Pending chemo/radiation    OT Lack of/limited family support, Decreased caregiver support, Pending chemo/radiation Will he have 24/7 supervision? Transport to radiation tx?  SLP      SW       Team Discharge Planning: Destination: PT-Home ,OT- Home , SLP-  Projected Follow-up: PT-Home health PT, OT-  Home health OT, SLP-  Projected Equipment Needs: PT-Rolling walker with 5" wheels, Wheelchair (measurements), Wheelchair cushion (measurements), OT- To be determined, SLP-  Equipment Details: PT-needs RW and 18x18 w/c, OT-  Patient/family involved in discharge planning: PT- Patient,  OT-Patient, SLP-   MD ELOS: 5-10 days modI Medical Rehab Prognosis:  Excellent Assessment: Clifford Jenkins is a 61 year old man admitted to CIR with functional deficits  secondary to stage IV metastatic prostate cancer/lytic and sclerotic bone lesions/cord compression/iliac lymphadenopathy.  Status post T5-6  T6-7 laminectomy transpedicular resection of thoracic spine tumor, T5-6 T6-7 posterior lateral arthrodesis, T5-7 posterior instrumentation pedicle screws and rods 08/19/2021.  Back brace when out of bed.  Patient will follow-up outpatient with Dr. Alen Blew as well as medical oncology. Medications are being managed, and labs and vitals are being monitored regularly.     See Team Conference Notes for weekly updates to the plan of care
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
## 2021-08-24 NOTE — Progress Notes (Signed)
Occupational Therapy Session Note  Patient Details  Name: MALAK ORANTES MRN: [id number] Date of Birth: [birth year]  Today's Date: 08/24/2021 OT Individual Time: 3754-3606 OT Individual Time Calculation (min): 54 min    Short Term Goals: Week 1:  OT Short Term Goal 1 (Week 1): STGs=LTGs due to ELOS  Skilled Therapeutic Interventions/Progress Updates:  Pt resting in recliner upon arrival. OT intervention with focus on functional amb with RW, toileting transfers, toileting, TTB tranfsers, DME requirements, and safety awareness to increase independence with BADLs. Amb with RW in room and hallway with min A/CGA. Toilet transfer and TTB tranfsers with min A. Pt will require TTB. Information provided to pt and girlfriend. Pt remained in relciner, seat alarm activated, and all needs within reach. Girlfriend present.   Therapy Documentation Precautions:  Precautions Precautions: Fall, Back Precaution Comments: Verbally reviewed precautions throughout functional mobility (pt referencing precautions on whiteboard to remember). Will need handout. Required Braces or Orthoses: Spinal Brace Spinal Brace: Thoracolumbosacral orthotic, Applied in sitting position Restrictions Weight Bearing Restrictions: No Pain:  Pt denies pain this afternoon   Therapy/Group: Individual Therapy  Leroy Libman 08/24/2021, 2:28 PM

## 2021-08-24 NOTE — Progress Notes (Signed)
BLE venous duplex has been completed.  Results can be found under chart review under CV PROC. 08/24/2021 3:49 PM Aissata Wilmore RVT, RDMS

## 2021-08-25 DIAGNOSIS — C7951 Secondary malignant neoplasm of bone: Secondary | ICD-10-CM | POA: Diagnosis not present

## 2021-08-25 DIAGNOSIS — C61 Malignant neoplasm of prostate: Secondary | ICD-10-CM | POA: Diagnosis not present

## 2021-08-25 MED ORDER — BISACODYL 10 MG RE SUPP
10.0000 mg | Freq: Every day | RECTAL | Status: DC | PRN
  Administered 2021-08-25: 10 mg via RECTAL
  Filled 2021-08-25: qty 1

## 2021-08-25 MED ORDER — SORBITOL 70 % SOLN
30.0000 mL | Freq: Every day | Status: DC | PRN
  Administered 2021-08-25 – 2021-08-28 (×2): 30 mL via ORAL
  Filled 2021-08-25 (×2): qty 30

## 2021-08-25 NOTE — Progress Notes (Signed)
LBM 1/10, complains of feeling constipated. Getting scheduled colace bid. Denies pain. Upper back incision OTA with steris. Patrici Ranks A

## 2021-08-25 NOTE — Progress Notes (Signed)
Occupational Therapy Session Note  Patient Details  Name: Clifford Jenkins MRN: [id number] Date of Birth: 1960-09-09  Today's Date: 08/25/2021 OT Individual Time: 4961-1643 OT Individual Time Calculation (min): 73 min    Short Term Goals: Week 1:  OT Short Term Goal 1 (Week 1): STGs=LTGs due to ELOS  Skilled Therapeutic Interventions/Progress Updates:  Patient met seated in recliner in agreement with OT treatment session. 0/10 pain reported at rest and with activity. Patient with desire to complete bathing at shower level. Min guard with RW for walk-in shower transfer to TTB. Patient then completed UB/LB bathing dressing with Min guard overall. Min A for functional mobility with RW to rehab gym in prep for NMR and therapeutic activity. NMR with ladder and heavy HHA to maintain dynamic balance 2/2 ataxia and continued BLE numbness. Patient then completed lateral steps and forward/backward steps over hockey stick with heavy HHA. Several sit to stands with Min guard and cues for hand/foot placement. Patient with decreased frustration tolerance requiring education on reason for difficulty with tasks. Focus then shifted to reaching outside of BOS in standing with blue foam mat. Task completed with Min guard and no overt LOB. Total A for wc transport back to room for energy conservation as patient has 2 more PT sessions this date. Session concluded with patient seated in recliner with call bell within reach, chair alarm activated and all needs met.   Therapy Documentation Precautions:  Precautions Precautions: Fall, Back Precaution Comments: Verbally reviewed precautions throughout functional mobility (pt referencing precautions on whiteboard to remember). Will need handout. Required Braces or Orthoses: Spinal Brace Spinal Brace: Thoracolumbosacral orthotic, Applied in sitting position Restrictions Weight Bearing Restrictions: No General:   Therapy/Group: Individual Therapy  Concepcion Gillott R  [place] 08/25/2021, 6:54 AM

## 2021-08-25 NOTE — Plan of Care (Signed)
°  Problem: SCI BOWEL ELIMINATION Goal: RH STG MANAGE BOWEL WITH ASSISTANCE Description: STG Manage Bowel with Assistance. Outcome: Not Progressing; LBM 1/10; colace 2x/day

## 2021-08-25 NOTE — Progress Notes (Signed)
PROGRESS NOTE   Subjective/Complaints: In good spirits, no pain  c/os, tolerating and enjoying therapy Denies difficulty with bladder , + bowel issues (constipation)  ROS: neg CP, SOB, N/V/D  Objective:   VAS [place] LOWER EXTREMITY VENOUS (DVT)  Result Date: 08/24/2021  Lower Venous DVT Study Patient Name:  Clifford Jenkins  Date of Exam:   08/24/2021 Medical Rec #: [id number]          Accession #:    [id number] Date of Birth: 1961/01/20         Patient Gender: M Patient Age:   61 years Exam Location:  [facility] Procedure:      VAS [place] LOWER EXTREMITY VENOUS (DVT) Referring Phys: Lauraine Rinne --------------------------------------------------------------------------------  Indications: Edema.  Comparison Study: No previous exams Performing Technologist: Jody Hill RVT, RDMS  Examination Guidelines: A complete evaluation includes B-mode imaging, spectral Doppler, color Doppler, and power Doppler as needed of all accessible portions of each vessel. Bilateral testing is considered an integral part of a complete examination. Limited examinations for reoccurring indications may be performed as noted. The reflux portion of the exam is performed with the patient in reverse Trendelenburg.  +---------+---------------+---------+-----------+----------+--------------+  RIGHT     Compressibility Phasicity Spontaneity Properties Thrombus Aging  +---------+---------------+---------+-----------+----------+--------------+  CFV       Full            Yes       Yes                                    +---------+---------------+---------+-----------+----------+--------------+  SFJ       Full                                                             +---------+---------------+---------+-----------+----------+--------------+  FV Prox   Full            Yes       Yes                                     +---------+---------------+---------+-----------+----------+--------------+  FV Mid    Full            Yes       Yes                                    +---------+---------------+---------+-----------+----------+--------------+  FV Distal Full            Yes       Yes                    rouleaux flow   +---------+---------------+---------+-----------+----------+--------------+  PFV       Full                                                             +---------+---------------+---------+-----------+----------+--------------+  POP       Full            Yes       Yes                                    +---------+---------------+---------+-----------+----------+--------------+  PTV       Full                                                             +---------+---------------+---------+-----------+----------+--------------+  PERO      Full                                                             +---------+---------------+---------+-----------+----------+--------------+   Right Technical Findings: Rouleaux flow noted in distal portion of FV  +---------+---------------+---------+-----------+----------+--------------+  LEFT      Compressibility Phasicity Spontaneity Properties Thrombus Aging  +---------+---------------+---------+-----------+----------+--------------+  CFV       Full            Yes       Yes                                    +---------+---------------+---------+-----------+----------+--------------+  SFJ       Full                                                             +---------+---------------+---------+-----------+----------+--------------+  FV Prox   Full            Yes       Yes                                    +---------+---------------+---------+-----------+----------+--------------+  FV Mid    Full            Yes       Yes                                    +---------+---------------+---------+-----------+----------+--------------+  FV Distal Full            Yes       Yes                                     +---------+---------------+---------+-----------+----------+--------------+  PFV       Full                                                             +---------+---------------+---------+-----------+----------+--------------+  POP       Full            Yes       Yes                                    +---------+---------------+---------+-----------+----------+--------------+  PTV       Full                                                             +---------+---------------+---------+-----------+----------+--------------+  PERO      Full                                                             +---------+---------------+---------+-----------+----------+--------------+     Summary: BILATERAL: - No evidence of deep vein thrombosis seen in the lower extremities, bilaterally. - No evidence of superficial venous thrombosis in the lower extremities, bilaterally. -No evidence of popliteal cyst, bilaterally.   *See table(s) above for measurements and observations. Electronically signed by Jamelle Haring on 08/24/2021 at 4:53:38 PM.    Final    Recent Labs    08/24/21 0510  WBC 15.2*  HGB 12.8*  HCT 37.1*  PLT 198    Recent Labs    08/24/21 0510  NA 136  K 3.8  CL 99  CO2 27  GLUCOSE 111*  BUN 12  CREATININE 0.99  CALCIUM 8.6*     Intake/Output Summary (Last 24 hours) at 08/25/2021 1203 Last data filed at 08/25/2021 0723 Gross per 24 hour  Intake 1200 ml  Output --  Net 1200 ml         Physical Exam: Vital Signs Blood pressure 136/75, pulse 85, temperature 98.7 F (37.1 C), temperature source Oral, resp. rate 18, height 5\' 7"  (1.702 m), weight 79.5 kg, SpO2 100 %.  General: No acute distress Mood and affect are appropriate Heart: Regular rate and rhythm no rubs murmurs or extra sounds Lungs: Clear to auscultation, breathing unlabored, no rales or wheezes Abdomen: Positive bowel sounds, soft nontender to palpation, nondistended Extremities: No clubbing,  cyanosis, or edema Skin: No evidence of breakdown, no evidence of rash Neurologic: Cranial nerves II through XII intact, motor strength is 5/5 in bilateral deltoid, bicep, tricep, grip, hip flexor, knee extensors, ankle dorsiflexor and plantar flexor Sensory exam normal sensation to light touch  in bilateral upper and lower extremities  Musculoskeletal: Full range of motion in all 4 extremities. No joint swelling   Skin: intact Neuro: Alert and oriented x4 Musculoskeletal: Thoracic brace in place. Moving lower extremities with full strength   Assessment/Plan: 1. Functional deficits which require 3+ hours per day of interdisciplinary therapy in a comprehensive inpatient rehab setting. Physiatrist is providing close team supervision and 24 hour management of active medical problems listed below. Physiatrist and rehab team continue to assess barriers to discharge/monitor patient progress toward functional and medical goals  Care Tool:  Bathing    Body parts bathed by patient: Right arm, Left arm, Chest, Abdomen, Buttocks, Front perineal area, Right upper leg, Left  upper leg, Right lower leg, Left lower leg, Face         Bathing assist Assist Level: Supervision/Verbal cueing     Upper Body Dressing/Undressing Upper body dressing   What is the patient wearing?: Pull over shirt    Upper body assist Assist Level: Supervision/Verbal cueing    Lower Body Dressing/Undressing Lower body dressing      What is the patient wearing?: Underwear/pull up     Lower body assist Assist for lower body dressing: Contact Guard/Touching assist     Toileting Toileting Toileting Activity did not occur (Clothing management and hygiene only): N/A (no void or bm)  Toileting assist Assist for toileting: Contact Guard/Touching assist     Transfers Chair/bed transfer  Transfers assist     Chair/bed transfer assist level: Minimal Assistance - Patient > 75%      Locomotion Ambulation   Ambulation assist      Assist level: Minimal Assistance - Patient > 75% Assistive device: Walker-rolling Max distance: 100'   Walk 10 feet activity   Assist     Assist level: Minimal Assistance - Patient > 75% Assistive device: Walker-rolling   Walk 50 feet activity   Assist    Assist level: Minimal Assistance - Patient > 75% Assistive device: Walker-rolling    Walk 150 feet activity   Assist Walk 150 feet activity did not occur: Safety/medical concerns         Walk 10 feet on uneven surface  activity   Assist Walk 10 feet on uneven surfaces activity did not occur: Safety/medical concerns         Wheelchair     Assist Is the patient using a wheelchair?: No Type of Wheelchair: Manual    Wheelchair assist level: Supervision/Verbal cueing Max wheelchair distance: 150'    Wheelchair 50 feet with 2 turns activity    Assist        Assist Level: Supervision/Verbal cueing   Wheelchair 150 feet activity     Assist      Assist Level: Supervision/Verbal cueing   Blood pressure 136/75, pulse 85, temperature 98.7 F (37.1 C), temperature source Oral, resp. rate 18, height 5\' 7"  (1.702 m), weight 79.5 kg, SpO2 100 %.      Medical Problem List and Plan: 1. Functional deficits secondary to stage IV metastatic prostate cancer/lytic and sclerotic bone lesions/cord compression/iliac lymphadenopathy.  Status post T5-6 T6-7 laminectomy transpedicular resection of thoracic spine tumor, T5-6 T6-7 posterior lateral arthrodesis, T5-7 posterior instrumentation pedicle screws and rods 08/19/2021.  Back brace when out of bed.  Patient will follow-up outpatient with Dr. Alen Blew as well as medical oncology             -patient may shower but incision must be covered No evidence of neurogenic bowel or bladder              -ELOS/Goals: 5-10 days modi            cont CIR PT, OT,  2.  Antithrombotics: -DVT/anticoagulation:   Mechanical: Antiembolism stockings, thigh (TED hose) Bilateral lower extremities check vascular study             -antiplatelet therapy: N/A 3.Postoperative pain: continue Robaxin 500 mg twice daily, oxycodone and Flexeril as needed. Will d/c tylenol given elevated ALT. 4. Situational anxiety: Lavender essential oil applied to pillow. Provide emotional support             -antipsychotic agents: N/A 5. Neuropsych: This patient is capable of making decisions  on his own behalf. 6. Skin/Wound Care: Routine skin checks 7. Fluids/Electrolytes/Nutrition: Routine in and outs with follow-up chemistries 8.  Hypertension.  Continue Norvasc 10 mg daily, Avapro 150 mg daily.  Monitor with increased mobility 9.  Constipation.  Continue colace 100mg  BID 10. Overweight BMI 29.29: provide dietary education 11. Elevated ALT: d/c Tylenol  LOS: 2 days A FACE TO FACE EVALUATION WAS PERFORMED  Charlett Blake 08/25/2021, 12:03 PM

## 2021-08-25 NOTE — Progress Notes (Signed)
Physical Therapy Session Note  Patient Details  Name: Clifford Jenkins MRN: [id number] Date of Birth: [birth year]  Today's Date: 08/25/2021 PT Individual Time:  Session 1: 0905-0930    Session 2: 2229-7989 Session 3: 1400-1443 PT Individual Time Calculation (min):  Session 1: 25 min        Session 2: 43 min       Session 3: 43 min Short Term Goals: Week 1:  PT Short Term Goal 1 (Week 1): =LTG due to ELOS  Skilled Therapeutic Interventions/Progress Updates:  Session 1: Chart reviewed and pt agreeable to therapy. Pt received supine in bed with no c/o pain. Session focused on amb endurance to promote household access. Pt initiated session with transfer to EOB using VC for log roll and SBA for side>sit. Pt then required max A to donn TLSO. Pt transferred to chair via SSTT using CGA + RW. Pt then amb 22ft in hallway with CGA + RW. Of note, pt experience 1 LOB that was corrected with PT assistance. During LOB, pt displayed decreased awareness of forward lean. Once returned to room, PT assessed static standing balance with eyes open and then closed, with noted forward lean when eyes closed. Pt was informed that following session would focus on balance exercises. Pt then completed SLS 3 x 30sec per leg. Pt was updated on and agreed to continued care plan. At end of session, pt was left seated in recliner with TLSO donned, alarm engaged, nurse call bell and all needs in reach.   Session 2: Chart reviewed and pt agreeable to therapy. Pt received supine in recliner with no c/o pain and TLSO donned. Session focused on balance, equilibrium, and ankle-righting strategies to decrease fall risk. Pt initiated session standing with no AD and eyes closed for 5x30sec. Pt displayed forward lean that required VC to correct. Pt was then coaching of finding equilibrium and sensing weight-shifts to manage equilibrium between each trial. With repeated practice, pt was able to hold balance from 5sec increasing to 20 sec  before forward tipping. Pt then practiced maintaining static standing balance with tactile cues at edge of BOS for 5 x30sec. Pt was able to use ankle-righting to return equilibrium when tactile cues noted with repeated practice. Pt then completed 2x10 heel raises with B hand rail, and 4 x 30sec heel raise holds with B hand rail.  Pt was updated on and agreed to continued care plan. At end of session, pt was left seated in recliner with TLSO donned, alarm engaged, nurse call bell and all needs in reach.   Session 3: Chart reviewed and pt agreeable to therapy. Pt received supine in recliner with no c/o pain and TLSO donned. Session focused on LE strengthening and cardiovascular endurance to support long-distance mobility. Pt transferred to [facility] with CGA+RW. Pt then transported to therapy gym for time management. Pt completed 2x149ft amb with CGA + RW. Pt then completed interval rounds on NuStep of 20sec low-mod-high resistance ranging from 3-10 workload for 9 mins. Pt then completed active rest with discussion of safety precautions, and pt was able to verbalize safe movement in/out of bed. Pt returned to room and required CGA + RW for transfer to toilet. Pt then returned to bed with CGA + RW. Pt was updated on and agreed to continued care plan. At end of session, pt was left semi-reclined in bed with alarm engaged, nurse call bell and all needs in reach.   Therapy Documentation Precautions:  Precautions Precautions: Fall, Back Precaution Comments:  Verbally reviewed precautions throughout functional mobility (pt referencing precautions on whiteboard to remember). Will need handout. Required Braces or Orthoses: Spinal Brace Spinal Brace: Thoracolumbosacral orthotic, Applied in sitting position Restrictions Weight Bearing Restrictions: No  Vital Signs: Therapy Vitals Temp: 99.1 F (37.3 C) Temp Source: Oral Pulse Rate: 88 Resp: 17 BP: 122/82 Patient Position (if appropriate): Sitting Oxygen  Therapy SpO2: 98 % O2 Device: Room Air   Therapy/Group: Individual Therapy  Marquette Saa, PT, DPT 08/25/2021, 4:28 PM

## 2021-08-25 NOTE — Progress Notes (Signed)
Patient requested sorbitol after his therapy. No BM as of this time he claims he  will let RN know if he needs suppository.

## 2021-08-26 DIAGNOSIS — C7951 Secondary malignant neoplasm of bone: Secondary | ICD-10-CM | POA: Diagnosis not present

## 2021-08-26 DIAGNOSIS — C61 Malignant neoplasm of prostate: Secondary | ICD-10-CM | POA: Diagnosis not present

## 2021-08-26 NOTE — Progress Notes (Signed)
PROGRESS NOTE   Subjective/Complaints: No issues this am , had BM x 2 yest after dulc supp   ROS: neg CP, SOB, N/V/D  Objective:   VAS [place] LOWER EXTREMITY VENOUS (DVT)  Result Date: 08/24/2021  Lower Venous DVT Study Patient Name:  Clifford Jenkins  Date of Exam:   08/24/2021 Medical Rec #: [id number]          Accession #:    [id number] Date of Birth: 08-13-1960         Patient Gender: M Patient Age:   61 years Exam Location:  [facility] Procedure:      VAS [place] LOWER EXTREMITY VENOUS (DVT) Referring Phys: Lauraine Rinne --------------------------------------------------------------------------------  Indications: Edema.  Comparison Study: No previous exams Performing Technologist: Jody Hill RVT, RDMS  Examination Guidelines: A complete evaluation includes B-mode imaging, spectral Doppler, color Doppler, and power Doppler as needed of all accessible portions of each vessel. Bilateral testing is considered an integral part of a complete examination. Limited examinations for reoccurring indications may be performed as noted. The reflux portion of the exam is performed with the patient in reverse Trendelenburg.  +---------+---------------+---------+-----------+----------+--------------+  RIGHT     Compressibility Phasicity Spontaneity Properties Thrombus Aging  +---------+---------------+---------+-----------+----------+--------------+  CFV       Full            Yes       Yes                                    +---------+---------------+---------+-----------+----------+--------------+  SFJ       Full                                                             +---------+---------------+---------+-----------+----------+--------------+  FV Prox   Full            Yes       Yes                                    +---------+---------------+---------+-----------+----------+--------------+  FV Mid    Full            Yes       Yes                                     +---------+---------------+---------+-----------+----------+--------------+  FV Distal Full            Yes       Yes                    rouleaux flow   +---------+---------------+---------+-----------+----------+--------------+  PFV       Full                                                             +---------+---------------+---------+-----------+----------+--------------+  POP       Full            Yes       Yes                                    +---------+---------------+---------+-----------+----------+--------------+  PTV       Full                                                             +---------+---------------+---------+-----------+----------+--------------+  PERO      Full                                                             +---------+---------------+---------+-----------+----------+--------------+   Right Technical Findings: Rouleaux flow noted in distal portion of FV  +---------+---------------+---------+-----------+----------+--------------+  LEFT      Compressibility Phasicity Spontaneity Properties Thrombus Aging  +---------+---------------+---------+-----------+----------+--------------+  CFV       Full            Yes       Yes                                    +---------+---------------+---------+-----------+----------+--------------+  SFJ       Full                                                             +---------+---------------+---------+-----------+----------+--------------+  FV Prox   Full            Yes       Yes                                    +---------+---------------+---------+-----------+----------+--------------+  FV Mid    Full            Yes       Yes                                    +---------+---------------+---------+-----------+----------+--------------+  FV Distal Full            Yes       Yes                                    +---------+---------------+---------+-----------+----------+--------------+  PFV       Full                                                              +---------+---------------+---------+-----------+----------+--------------+  POP       Full            Yes       Yes                                    +---------+---------------+---------+-----------+----------+--------------+  PTV       Full                                                             +---------+---------------+---------+-----------+----------+--------------+  PERO      Full                                                             +---------+---------------+---------+-----------+----------+--------------+     Summary: BILATERAL: - No evidence of deep vein thrombosis seen in the lower extremities, bilaterally. - No evidence of superficial venous thrombosis in the lower extremities, bilaterally. -No evidence of popliteal cyst, bilaterally.   *See table(s) above for measurements and observations. Electronically signed by Jamelle Haring on 08/24/2021 at 4:53:38 PM.    Final    Recent Labs    08/24/21 0510  WBC 15.2*  HGB 12.8*  HCT 37.1*  PLT 198    Recent Labs    08/24/21 0510  NA 136  K 3.8  CL 99  CO2 27  GLUCOSE 111*  BUN 12  CREATININE 0.99  CALCIUM 8.6*     Intake/Output Summary (Last 24 hours) at 08/26/2021 0727 Last data filed at 08/25/2021 2300 Gross per 24 hour  Intake 1440 ml  Output --  Net 1440 ml         Physical Exam: Vital Signs Blood pressure 137/76, pulse 79, temperature 98.7 F (37.1 C), temperature source Oral, resp. rate 18, height 5\' 7"  (1.702 m), weight 79.5 kg, SpO2 99 %.  General: No acute distress Mood and affect are appropriate Heart: Regular rate and rhythm no rubs murmurs or extra sounds Lungs: Clear to auscultation, breathing unlabored, no rales or wheezes Abdomen: Positive bowel sounds, soft nontender to palpation, nondistended Extremities: No clubbing, cyanosis, or edema Skin: No evidence of breakdown, no evidence of rash Neurologic: Cranial nerves II through XII intact, motor strength is  5/5 in bilateral deltoid, bicep, tricep, grip, hip flexor, knee extensors, ankle dorsiflexor and plantar flexor Sensory exam normal sensation to light touch  in bilateral upper and lower extremities  Musculoskeletal: Full range of motion in all 4 extremities. No joint swelling   Skin: intact Neuro: Alert and oriented x4 Musculoskeletal: Thoracic brace in place. Moving lower extremities with full strength   Assessment/Plan: 1. Functional deficits which require 3+ hours per day of interdisciplinary therapy in a comprehensive inpatient rehab setting. Physiatrist is providing close team supervision and 24 hour management of active medical problems listed below. Physiatrist and rehab team continue to assess barriers to discharge/monitor patient progress toward functional and medical goals  Care Tool:  Bathing    Body parts bathed by patient: Right arm, Left arm, Chest, Abdomen, Buttocks, Front perineal area, Right upper leg, Left  upper leg, Right lower leg, Left lower leg, Face         Bathing assist Assist Level: Supervision/Verbal cueing     Upper Body Dressing/Undressing Upper body dressing   What is the patient wearing?: Pull over shirt    Upper body assist Assist Level: Supervision/Verbal cueing    Lower Body Dressing/Undressing Lower body dressing      What is the patient wearing?: Underwear/pull up     Lower body assist Assist for lower body dressing: Supervision/Verbal cueing     Toileting Toileting Toileting Activity did not occur (Clothing management and hygiene only): N/A (no void or bm)  Toileting assist Assist for toileting: Contact Guard/Touching assist     Transfers Chair/bed transfer  Transfers assist     Chair/bed transfer assist level: Supervision/Verbal cueing     Locomotion Ambulation   Ambulation assist      Assist level: Minimal Assistance - Patient > 75% Assistive device: Walker-rolling Max distance: 100'   Walk 10 feet  activity   Assist     Assist level: Minimal Assistance - Patient > 75% Assistive device: Walker-rolling   Walk 50 feet activity   Assist    Assist level: Minimal Assistance - Patient > 75% Assistive device: Walker-rolling    Walk 150 feet activity   Assist Walk 150 feet activity did not occur: Safety/medical concerns         Walk 10 feet on uneven surface  activity   Assist Walk 10 feet on uneven surfaces activity did not occur: Safety/medical concerns         Wheelchair     Assist Is the patient using a wheelchair?: No Type of Wheelchair: Manual    Wheelchair assist level: Supervision/Verbal cueing Max wheelchair distance: 150'    Wheelchair 50 feet with 2 turns activity    Assist        Assist Level: Supervision/Verbal cueing   Wheelchair 150 feet activity     Assist      Assist Level: Supervision/Verbal cueing   Blood pressure 137/76, pulse 79, temperature 98.7 F (37.1 C), temperature source Oral, resp. rate 18, height 5\' 7"  (1.702 m), weight 79.5 kg, SpO2 99 %.      Medical Problem List and Plan: 1. Functional deficits secondary to stage IV metastatic prostate cancer/lytic and sclerotic bone lesions/cord compression/iliac lymphadenopathy.  Status post T5-6 T6-7 laminectomy transpedicular resection of thoracic spine tumor, T5-6 T6-7 posterior lateral arthrodesis, T5-7 posterior instrumentation pedicle screws and rods 08/19/2021.  Back brace when out of bed.  Patient will follow-up outpatient with Dr. Alen Blew as well as medical oncology             -patient may shower but incision must be covered No evidence of neurogenic bowel or bladder              -ELOS/Goals: 5-10 days modi            cont CIR PT, OT,  2.  Antithrombotics: -DVT/anticoagulation:  Mechanical: Antiembolism stockings, thigh (TED hose) Bilateral lower extremities check vascular study             -antiplatelet therapy: N/A 3.Postoperative pain: continue Robaxin 500  mg twice daily, oxycodone and Flexeril as needed. Will d/c tylenol given elevated ALT. 4. Situational anxiety: Lavender essential oil applied to pillow. Provide emotional support             -antipsychotic agents: N/A 5. Neuropsych: This patient is capable of making decisions on his own behalf. 6.  Skin/Wound Care: Routine skin checks 7. Fluids/Electrolytes/Nutrition: Routine in and outs with follow-up chemistries 8.  Hypertension.  Continue Norvasc 10 mg daily, Avapro 150 mg daily.  Monitor with increased mobility 9.  Constipation.  Continue colace 100mg  BID, also received dulc supp with 2 lg BM yesterday  10. Overweight BMI 29.29: provide dietary education 11. Elevated ALT: d/c Tylenol  LOS: 3 days A FACE TO FACE EVALUATION WAS PERFORMED  Charlett Blake 08/26/2021, 7:27 AM

## 2021-08-26 NOTE — Progress Notes (Signed)
PRN dulcolax supp. Given at 2011 with 2 large BM's. Denies pain. Back incision OTA, steri strips in place. Patrici Ranks A

## 2021-08-27 DIAGNOSIS — C61 Malignant neoplasm of prostate: Secondary | ICD-10-CM | POA: Diagnosis not present

## 2021-08-27 DIAGNOSIS — C7951 Secondary malignant neoplasm of bone: Secondary | ICD-10-CM | POA: Diagnosis not present

## 2021-08-27 MED ORDER — SORBITOL 70 % SOLN
30.0000 mL | Freq: Once | Status: AC
  Administered 2021-08-27: 30 mL via ORAL
  Filled 2021-08-27: qty 30

## 2021-08-27 NOTE — Progress Notes (Signed)
PROGRESS NOTE   Subjective/Complaints:  Pt reports LBM Saturday after suppository- not since then- still feeling constipated-  Discussed needs Short term disability - needs to call [employer] and get paperwork since to SW and we can fill it out.   Wants to go home, but knows not ready.   ROS: Pt denies SOB, abd pain, CP, N/V/(+) C/D, and vision changes   Objective:   No results found. No results for input(s): WBC, HGB, HCT, PLT in the last 72 hours. No results for input(s): NA, K, CL, CO2, GLUCOSE, BUN, CREATININE, CALCIUM in the last 72 hours.  Intake/Output Summary (Last 24 hours) at 08/27/2021 0839 Last data filed at 08/27/2021 0700 Gross per 24 hour  Intake 1560 ml  Output --  Net 1560 ml        Physical Exam: Vital Signs Blood pressure 122/84, pulse 73, temperature 98.2 F (36.8 C), temperature source Oral, resp. rate 16, height 5\' 7"  (1.702 m), weight 79.5 kg, SpO2 100 %.   General: awake, alert, appropriate, sitting up slightly in bed; <30 degrees; TLSO not on; NAD HENT: conjugate gaze; oropharynx moist CV: regular rate; no JVD Pulmonary: CTA B/L; no W/R/R- good air movement GI: soft, NT, ND, (+)BS- hypoactive;  Psychiatric: appropriate; interactive Neurological: lisp- Alert Skin: No evidence of breakdown, no evidence of rash Neurologic: Cranial nerves II through XII intact, motor strength is 5/5 in bilateral deltoid, bicep, tricep, grip, hip flexor, knee extensors, ankle dorsiflexor and plantar flexor Sensory exam normal sensation to light touch  in bilateral upper and lower extremities  Musculoskeletal: Full range of motion in all 4 extremities. No joint swelling   Skin: intact except steristrips over thoracic back incision- mild swelling over L side of incision.  Neuro: Alert and oriented x4 Musculoskeletal: Thoracic brace in place. Moving lower extremities with full strength   Assessment/Plan: 1.  Functional deficits which require 3+ hours per day of interdisciplinary therapy in a comprehensive inpatient rehab setting. Physiatrist is providing close team supervision and 24 hour management of active medical problems listed below. Physiatrist and rehab team continue to assess barriers to discharge/monitor patient progress toward functional and medical goals  Care Tool:  Bathing    Body parts bathed by patient: Right arm, Left arm, Chest, Abdomen, Buttocks, Front perineal area, Right upper leg, Left upper leg, Right lower leg, Left lower leg, Face         Bathing assist Assist Level: Supervision/Verbal cueing     Upper Body Dressing/Undressing Upper body dressing   What is the patient wearing?: Pull over shirt    Upper body assist Assist Level: Supervision/Verbal cueing    Lower Body Dressing/Undressing Lower body dressing      What is the patient wearing?: Underwear/pull up     Lower body assist Assist for lower body dressing: Supervision/Verbal cueing     Toileting Toileting Toileting Activity did not occur (Clothing management and hygiene only): N/A (no void or bm)  Toileting assist Assist for toileting: Contact Guard/Touching assist     Transfers Chair/bed transfer  Transfers assist     Chair/bed transfer assist level: Supervision/Verbal cueing     Locomotion Ambulation   Ambulation assist  Assist level: Minimal Assistance - Patient > 75% Assistive device: Walker-rolling Max distance: 100'   Walk 10 feet activity   Assist     Assist level: Minimal Assistance - Patient > 75% Assistive device: Walker-rolling   Walk 50 feet activity   Assist    Assist level: Minimal Assistance - Patient > 75% Assistive device: Walker-rolling    Walk 150 feet activity   Assist Walk 150 feet activity did not occur: Safety/medical concerns         Walk 10 feet on uneven surface  activity   Assist Walk 10 feet on uneven surfaces activity  did not occur: Safety/medical concerns         Wheelchair     Assist Is the patient using a wheelchair?: No Type of Wheelchair: Manual    Wheelchair assist level: Supervision/Verbal cueing Max wheelchair distance: 150'    Wheelchair 50 feet with 2 turns activity    Assist        Assist Level: Supervision/Verbal cueing   Wheelchair 150 feet activity     Assist      Assist Level: Supervision/Verbal cueing   Blood pressure 122/84, pulse 73, temperature 98.2 F (36.8 C), temperature source Oral, resp. rate 16, height 5\' 7"  (1.702 m), weight 79.5 kg, SpO2 100 %.      Medical Problem List and Plan: 1. Functional deficits secondary to stage IV metastatic prostate cancer/lytic and sclerotic bone lesions/cord compression/iliac lymphadenopathy.  Status post T5-6 T6-7 laminectomy transpedicular resection of thoracic spine tumor, T5-6 T6-7 posterior lateral arthrodesis, T5-7 posterior instrumentation pedicle screws and rods 08/19/2021.  Back brace when out of bed.  Patient will follow-up outpatient with Dr. Alen Blew as well as medical oncology             -patient may shower but incision must be covered No evidence of neurogenic bowel or bladder              -ELOS/Goals: 5-10 days modi            Continue CIR- PT, OT- will need a minimum of Short term disability.  2.  Antithrombotics: -DVT/anticoagulation:  Mechanical: Antiembolism stockings, thigh (TED hose) Bilateral lower extremities check vascular study             -antiplatelet therapy: N/A 3.Postoperative pain: continue Robaxin 500 mg twice daily, oxycodone and Flexeril as needed. Will d/c tylenol given elevated ALT.  1/16- pain is controlled per pt- con't regimen 4. Situational anxiety: Lavender essential oil applied to pillow. Provide emotional support             -antipsychotic agents: N/A 5. Neuropsych: This patient is capable of making decisions on his own behalf. 6. Skin/Wound Care: Routine skin checks 7.  Fluids/Electrolytes/Nutrition: Routine in and outs with follow-up chemistries 8.  Hypertension.  Continue Norvasc 10 mg daily, Avapro 150 mg daily.  Monitor with increased mobility  1/16- BP controlled- con't regimen 9.  Constipation.  Continue colace 100mg  BID, also received dulc supp with 2 lg BM yesterday   1/16- will order Sorbitol 30 cc at 3pm today after therapy to help clean him out.  10. Overweight BMI 29.29: provide dietary education 11. Elevated ALT: d/c Tylenol  1/16- will recheck in Am to make sure trending down.  12. Leukocytosis  1/16- will recheck in AM  LOS: 4 days A FACE TO FACE EVALUATION WAS PERFORMED  Glynnis Gavel 08/27/2021, 8:39 AM

## 2021-08-27 NOTE — Progress Notes (Signed)
Physical Therapy Session Note  Patient Details  Name: Clifford Jenkins MRN: [id number] Date of Birth: [birth year]  Today's Date: 08/27/2021 PT Individual Time: 1015-1101 PT Individual Time Calculation (min): 46 min   Short Term Goals: Week 1:  PT Short Term Goal 1 (Week 1): =LTG due to ELOS Week 2:    Week 3:      Therapy Documentation PAIN denies pain  Pt initially oob in wc and eager for session.  Pt transported to gym.  Gait 7ft using line on floor for feedback to control scissoring, cga, ataxic, slow cadence w/rigid trunk appearance.  In parallel bars: Lateral stepping L/R over 10in wedge w/emphasis on controlled placement/wide stepping, core stability/posture, using bar for balance and mirror for feedback.  2 sets of 4-5 min  Lateral stepping using yellow theraband resistance 17ft x 6 w/mirror for feedback to facilitate foot placement and control core/posture.  Static standing balance w/feet together, mirror for feedback, cga to mod assist, significant sway, focus on core stabilization during task leads to improved stability at times.  Gait 1101ft w/RW, cga, occasional cues to widen base, mildly ataxic gait, cga.  Backs to recliner w/cga.  stand pivot transfer recliner to wc w/cga and rw.  Pt left oob in wc w/alarm belt set and needs in reach   Precautions:  Precautions Precautions: Fall, Back Precaution Comments: Verbally reviewed precautions throughout functional mobility (pt referencing precautions on whiteboard to remember). Will need handout. Required Braces or Orthoses: Spinal Brace Spinal Brace: Thoracolumbosacral orthotic, Applied in sitting position Restrictions Weight Bearing Restrictions: No     Therapy/Group: Individual Therapy Callie Fielding, PT   Jerrilyn Cairo 08/27/2021, 11:58 AM

## 2021-08-27 NOTE — Progress Notes (Signed)
Physical Therapy Session Note  Patient Details  Name: Clifford Jenkins MRN: [id number] Date of Birth: October 16, 1960  Today's Date: 08/27/2021 PT Individual Time: 1100-1200; 1430-1545 PT Individual Time Calculation (min): 60 min and 75 min  Short Term Goals: Week 1:  PT Short Term Goal 1 (Week 1): =LTG due to ELOS  Skilled Therapeutic Interventions/Progress Updates:    Session 1: Pt received seated in recliner in room, agreeable to PT session. No complaints of pain. Sit to stand with CGA to RW during session. Ambulation 2 x 150 ft with RW at [facility] level throughout session. Pt exhibits flexed trunk/head during gait due to attempt to visualize feet during gait. Pt also exhibits scissoring of BLE during gait. Sidesteps L/R 5 x 10 ft in // bars with CGA for balance, decreased control of RLE as compared to LLE. Ascend/descend one 4" step in // bars 2 x 15 reps with focus on knee control with B hyperextension noted as well as unlocking knee when descending step and LE placement/coordination. Pt exhibits improved control with verbal feedback. Standing alt L/R cone taps in // bars with CGA for balance, good control of LE noted when tapping cones but needs feedback to widen BOS when bringing LE back to the floor. Pt left seated in w/c in room with needs in reach, chair alarm in place at end of session.  Session 2: Pt received seated in w/c in room, agreeable to PT session. No complaints of pain. Sit to stand with CGA to RW during session. Ambulation 2 x 150 ft with RW at [facility] level throughout session, improved upright posture with gait with ongoing scissoring of BLE at times. Standing squats 3 x 10-15 reps with RW and CGA for balance initially with max cueing for correct exercise performance with improvement following cues. Sit to stand x 5 reps with no UE support and min A with focus on anterior weight shift during transfer. Standing balance with no UE support performing ball toss against rebounder 3 x 30 reps  with min A for balance, some anterior LOB that pt able to correct with use of ankle strategy.  Education regarding SCI and symptoms pt experiencing including sensory deficits in BLE, muscle weakness, and bowel/bladder impairments. Pt appreciative of and receptive to education. Pt left seated in w/c in room with needs in reach, chair alarm in place at end of session.  Therapy Documentation Precautions:  Precautions Precautions: Fall, Back Precaution Comments: Verbally reviewed precautions throughout functional mobility (pt referencing precautions on whiteboard to remember). Will need handout. Required Braces or Orthoses: Spinal Brace Spinal Brace: Thoracolumbosacral orthotic, Applied in sitting position Restrictions Weight Bearing Restrictions: No     Therapy/Group: Individual Therapy   Excell Seltzer, PT, DPT, CSRS  08/27/2021, 12:43 PM

## 2021-08-27 NOTE — Progress Notes (Addendum)
Occupational Therapy Session Note  Patient Details  Name: Clifford Jenkins MRN: [id number] Date of Birth: 13-Nov-1960  Today's Date: 08/27/2021 OT Individual Time: 3329-5188 OT Individual Time Calculation (min): 53 min    Short Term Goals: Week 1:  OT Short Term Goal 1 (Week 1): STGs=LTGs due to ELOS  Skilled Therapeutic Interventions/Progress Updates:  Skilled OT intervention completed with focus on functional transfers, ADL retraining, activity tolerance. Pt received supine in bed, requesting to take shower. Pt with good physical adherence to back precautions throughout session, with min cues needed, however pt unable to state all 3 precautions when asked, with cues needed to recall. Pt completed bed mobility with supervision via semi-log roll then seated EOB, able to donn back brace with supervision. Completed short ambulatory transfer with CGA using RW to TTB in shower. Self initiated taking out hearing aids, and required cues for taking off pants prior taking brace off to limit standing without brace. Pt doffed all clothing with supervision, and completed bathing with supervision, with pt using figure 4 position of legs to bathe/dress LB vs bending. Pt completed dressing with supervision including back brace, with CGA needed in stance for donning pants over hips due to balance. Completed grooming tasks seated at sink with mod I. To promote endurance needed for functional transfers, pt functionally ambulated around day room about 100 ft using RW with CGA and w/c follow for safety. Pt required min cues to take wider turns vs narrow to minimize ataxic gait. Pt left seated in w/c, with chair alarm on, and all needs in reach at end of session.   Therapy Documentation Precautions:  Precautions Precautions: Fall, Back Precaution Comments: Verbally reviewed precautions throughout functional mobility (pt referencing precautions on whiteboard to remember). Will need handout. Required Braces or  Orthoses: Spinal Brace Spinal Brace: Thoracolumbosacral orthotic, Applied in sitting position Restrictions Weight Bearing Restrictions: No  Pain: No c/o pain   Therapy/Group: Individual Therapy  Chantil Bari E Mohogany Toppins 08/27/2021, 7:39 AM

## 2021-08-28 DIAGNOSIS — C61 Malignant neoplasm of prostate: Secondary | ICD-10-CM | POA: Diagnosis not present

## 2021-08-28 DIAGNOSIS — C7951 Secondary malignant neoplasm of bone: Secondary | ICD-10-CM | POA: Diagnosis not present

## 2021-08-28 LAB — CBC WITH DIFFERENTIAL/PLATELET
Abs Immature Granulocytes: 0.07 10*3/uL (ref 0.00–0.07)
Basophils Absolute: 0 10*3/uL (ref 0.0–0.1)
Basophils Relative: 0 %
Eosinophils Absolute: 0.2 10*3/uL (ref 0.0–0.5)
Eosinophils Relative: 2 %
HCT: 36.6 % — ABNORMAL LOW (ref 39.0–52.0)
Hemoglobin: 11.8 g/dL — ABNORMAL LOW (ref 13.0–17.0)
Immature Granulocytes: 1 %
Lymphocytes Relative: 16 %
Lymphs Abs: 1.6 10*3/uL (ref 0.7–4.0)
MCH: 29.6 pg (ref 26.0–34.0)
MCHC: 32.2 g/dL (ref 30.0–36.0)
MCV: 91.7 fL (ref 80.0–100.0)
Monocytes Absolute: 1.1 10*3/uL — ABNORMAL HIGH (ref 0.1–1.0)
Monocytes Relative: 11 %
Neutro Abs: 7.1 10*3/uL (ref 1.7–7.7)
Neutrophils Relative %: 70 %
Platelets: 283 10*3/uL (ref 150–400)
RBC: 3.99 MIL/uL — ABNORMAL LOW (ref 4.22–5.81)
RDW: 11.9 % (ref 11.5–15.5)
WBC: 10.1 10*3/uL (ref 4.0–10.5)
nRBC: 0 % (ref 0.0–0.2)

## 2021-08-28 LAB — COMPREHENSIVE METABOLIC PANEL
ALT: 98 U/L — ABNORMAL HIGH (ref 0–44)
AST: 44 U/L — ABNORMAL HIGH (ref 15–41)
Albumin: 2.4 g/dL — ABNORMAL LOW (ref 3.5–5.0)
Alkaline Phosphatase: 162 U/L — ABNORMAL HIGH (ref 38–126)
Anion gap: 9 (ref 5–15)
BUN: 13 mg/dL (ref 6–20)
CO2: 27 mmol/L (ref 22–32)
Calcium: 9 mg/dL (ref 8.9–10.3)
Chloride: 100 mmol/L (ref 98–111)
Creatinine, Ser: 0.91 mg/dL (ref 0.61–1.24)
GFR, Estimated: >60 mL/min (ref >60)
Glucose, Bld: 113 mg/dL — ABNORMAL HIGH (ref 70–99)
Potassium: 4.6 mmol/L (ref 3.5–5.1)
Sodium: 136 mmol/L (ref 135–145)
Total Bilirubin: 0.6 mg/dL (ref 0.3–1.2)
Total Protein: 6.5 g/dL (ref 6.5–8.1)

## 2021-08-28 MED ORDER — SORBITOL 70 % SOLN
45.0000 mL | Freq: Once | Status: AC
  Administered 2021-08-28: 45 mL via ORAL
  Filled 2021-08-28: qty 60

## 2021-08-28 MED ORDER — POLYETHYLENE GLYCOL 3350 17 G PO PACK
17.0000 g | PACK | Freq: Every day | ORAL | Status: DC
  Administered 2021-08-28 – 2021-08-31 (×4): 17 g via ORAL
  Filled 2021-08-28 (×4): qty 1

## 2021-08-28 NOTE — Plan of Care (Signed)
°  Problem: Sit to Stand Goal: LTG:  Patient will perform sit to stand with assistance level (PT) Description: LTG:  Patient will perform sit to stand with assistance level (PT) Flowsheets (Taken 08/28/2021 1110) LTG: PT will perform sit to stand in preparation for functional mobility with assistance level: (upgrade due to progress) Independent with assistive device Note: upgrade due to progress   Problem: RH Bed to Chair Transfers Goal: LTG Patient will perform bed/chair transfers w/assist (PT) Description: LTG: Patient will perform bed to chair transfers with assistance (PT). Flowsheets (Taken 08/28/2021 1110) LTG: Pt will perform Bed to Chair Transfers with assistance level: (upgrade due to progress) Independent with assistive device  Note: upgrade due to progress   Problem: RH Furniture Transfers Goal: LTG Patient will perform furniture transfers w/assist (OT/PT) Description: LTG: Patient will perform furniture transfers  with assistance (OT/PT). Flowsheets (Taken 08/28/2021 1110) LTG: Pt will perform furniture transfers with assist:: (upgrade due to progress) Independent with assistive device  Note: upgrade due to progress

## 2021-08-28 NOTE — Progress Notes (Addendum)
Patient ID: Clifford Jenkins, male   DOB: Mar 11, 1961, 61 y.o.   MRN: [id number]  SW met with pt earlier to provide updates from team conference, and d/c date remains 1/20. Pt reports he does not have DME. States that his s/o [name] will be here later. SW will follow-up.   SW ordered: RW, 3in1 BSC, and w/c with [employer] via parachute.   *SW met with pt and pt s/o [name] to provide updates from team conference, DME ordered, and family edu and outpatient preference. She reports she will try to come in tomorrow at his scheduled therapy times, if not, Thursday 1:30pm-4pm. She will call if she is not able to come. Outpatient location- [facility].   Loralee Pacas, MSW, [place] Office: [phone] Cell: [phone] Fax: [phone]

## 2021-08-28 NOTE — Progress Notes (Incomplete Revision)
Patient ID: Clifford Jenkins, male   DOB: 03-03-1961, 61 y.o.   MRN: [id number]  SW met with pt earlier to provide updates from team conference, and d/c date remains 1/20. Pt reports he does not have DME. States that his s/o Clifford Jenkins will be here later. SW will follow-up.   SW ordered: RW, 3in1 BSC, and w/c with [employer] via parachute.   *SW met with pt and pt s/o Clifford Jenkins to provide updates from team conference, DME ordered, and family edu and outpatient preference.   Loralee Pacas, MSW, [place] Office: [phone] Cell: [phone] Fax: [phone]

## 2021-08-28 NOTE — Patient Care Conference (Signed)
Inpatient RehabilitationTeam Conference and Plan of Care Update Date: 08/28/2021   Time: 11:06 AM    Patient Name: Clifford Jenkins      Medical Record Number: [id number]  Date of Birth: [birth year] Sex: Male         Room/Bed: 4W12C/4W12C-02 Payor Info: Payor: [place] / Plan: BCBS COMM PPO / Product Type: *No Product type* /    Admit Date/Time:  08/23/2021  4:04 PM  Primary Diagnosis:  Prostate cancer metastatic to bone [facility])  Hospital Problems: Principal Problem:   Prostate cancer metastatic to bone [facility])    Expected Discharge Date: Expected Discharge Date: 08/31/21  Team Members Present: Physician leading conference: Dr. Courtney Heys Social Worker Present: Loralee Pacas, [place] Nurse Present: Dorien Chihuahua, RN PT Present: Excell Seltzer, PT OT Present: Roanna Epley, [place], OT SLP Present: Charolett Bumpers, SLP PPS Coordinator present : Gunnar Fusi, SLP     Current Status/Progress Goal Weekly Team Focus  Bowel/Bladder     Continent; constipated   Continent   Address need for and effectiveness of stool softener/laxative  Swallow/Nutrition/ Hydration             ADL's   bathing at shower level-CGA; LB dressing-min A: UB dressing including orthosis-supervision; tranfsers-CGA  supervision overall  BADL retraining, activity tolerance, safety awarness, standing balance   Mobility   CGA transfers with RW, CGA gait with RW up to 150 ft, CGA stairs with 2 handrails  Supervision overall at ambulatory level with LRAD  LE NMR, balance, gait, LE strengthening   Communication             Safety/Cognition/ Behavioral Observations            Pain     Pain managed with occasional medication   Pain managed with prn meds   Monitor need for and effectiveness of medications  Skin     Back incision; edema on left side   Back incision healing    Monitor skin and healing of incision; edema and pain    Discharge Planning:  D/c to home with his s/o Clifford Jenkins  who will provider PRN assistance as she works 4am-10am a few times during the week.   Team Discussion: Patient having problems with constipation although not taking much pain medication post surgery. KUB pending results of laxatives. WBC down and LFTs elevated per MD; Discontinued tylenol and following.  Patient on target to meet rehab goals: yes, currently needs supervision for transfers. Completes ambulation up to 150' with CGA. Do note loss of balance when standing but patient is able to correct and completes ADLs with supervision - CGA and able to don/doff the TLSO brace without difficulty.  *See Care Plan and progress notes for long and short-term goals.   Revisions to Treatment Plan:  Upgraded goals to mod I - supervision level for PT and OT   Teaching Needs: Safety, precautions, skin care, medication management, transfers, etc.  Current Barriers to Discharge: Decreased caregiver support; wife works 4a-10a daily.  Possible Resolutions to Barriers: Family education     Medical Summary Current Status: s/p thoracic fusion- voiding OK- but constipated- needs ot go- didn't respond to sorbitol yesterday-  Barriers to Discharge: Other (comments);Decreased family/caregiver support;Home enviroment access/layout;Medical stability;Wound care;Weight bearing restrictions  Barriers to Discharge Comments: wife works 4am to 10am- otherwise will be there Possible Resolutions to [employer]: upgrade goals to mod I for some of goals- - walking 150 ft CGA RW; occ LOB- can self correct  some; can don/doff TLSO-  d/c Friday 1/20   Continued Need for Acute Rehabilitation Level of Care: The patient requires daily medical management by a physician with specialized training in physical medicine and rehabilitation for the following reasons: Direction of a multidisciplinary physical rehabilitation program to maximize functional independence : Yes Medical management of patient stability for increased  activity during participation in an intensive rehabilitation regime.: Yes Analysis of laboratory values and/or radiology reports with any subsequent need for medication adjustment and/or medical intervention. : Yes   I attest that I was present, lead the team conference, and concur with the assessment and plan of the team.   Dorien Chihuahua B 08/28/2021, 2:53 PM

## 2021-08-28 NOTE — Progress Notes (Signed)
Physical Therapy Session Note  Patient Details  Name: Clifford Jenkins MRN: [id number] Date of Birth: 1961-04-19  Today's Date: 08/28/2021 PT Individual Time: 1000-1100; 1500-1600 PT Individual Time Calculation (min): 60 min and 60 min  Short Term Goals: Week 1:  PT Short Term Goal 1 (Week 1): =LTG due to ELOS  Skilled Therapeutic Interventions/Progress Updates:    Session 1: Pt received seated in w/c in room, agreeable to PT session. No complaints of pain this date just reports abdominal discomfort due to constipation. Sit to stand with close Supervision to CGA to RW during session. Ambulation 2 x 150 ft with RW and close Supervision to CGA during session. Pt exhibits decreased scissoring of BLE and improved trunk extension, does need cues for decreased UE reliance. Ascend/descend 12 x 6" stairs with 2 handrails and CGA with step-to gait pattern, focus on eccentric control when descending. Pt exhibits improved ability to navigate stairs safely from previous session of stair navigation. Sidesteps L/R 6 x 10 ft in // bars with close Supervision for balance with improved RLE control noted as compared to previous date. Sit to stand 2 x 5 reps with no AD and CGA to min A with focus on maintaining standing balance with no UE support. Pt exhibits good use of ankle strategy to maintain balance in standing. Pt reports feeling very fatigued at end of session as well as noting ongoing abdominal discomfort. Pt left seated in w/c in room with needs in reach, chair alarm in place.  Session 2: Pt received seated in w/c in room, agreeable to PT session. No complaints of pain other than ongoing abdominal discomfort due to constipation. Pt reports improvement in fatigue from AM session. Pt's girlfriend also present, updated pt and family on d/c date and equipment he will likely d/c home with. Pt excited by d/c date. Also updated family on pt's expected level of assist at d/c (Supervision to mod I) and that pt will  be safe home alone for periods of time. Sit to stand with Supervision to RW during session. Ambulation up to 200 ft during session with RW and Supervision to CGA level. Pt continues to exhibit improved gait pattern with improved LE control. Ambulation through obstacle course navigating over uneven surface, stepping over obstacles, and sidestepping through cones with RW and CGA to min A for balance. Trial gait with no RW and handrail in hallway with min A, decrease in balance noted with increased difficulty taking steps at times. Pt continues to remain safest ambulating with RW at this time. Standing squats with RW and Supervision 3 x 20 reps to fatigue. Pt returned to room and left seated in w/c in room with needs in reach, chair alarm in place at end of session.  Therapy Documentation Precautions:  Precautions Precautions: Fall, Back Precaution Comments: Verbally reviewed precautions throughout functional mobility (pt referencing precautions on whiteboard to remember). Will need handout. Required Braces or Orthoses: Spinal Brace Spinal Brace: Thoracolumbosacral orthotic, Applied in sitting position Restrictions Weight Bearing Restrictions: No      Therapy/Group: Individual Therapy   Excell Seltzer, PT, DPT, CSRS  08/28/2021, 11:23 AM

## 2021-08-28 NOTE — Care Management (Signed)
[place] Individual Statement of Services  Patient Name:  Clifford Jenkins  Date:  08/28/2021  Welcome to the [facility].  Our goal is to provide you with an individualized program based on your diagnosis and situation, designed to meet your specific needs.  With this comprehensive rehabilitation program, you will be expected to participate in at least 3 hours of rehabilitation therapies Monday-Friday, with modified therapy programming on the weekends.  Your rehabilitation program will include the following services:  Physical Therapy (PT), Occupational Therapy (OT), 24 hour per day rehabilitation nursing, Therapeutic Recreaction (TR), Psychology, Neuropsychology, Care Coordinator, Rehabilitation Medicine, [place], and Other  Weekly team conferences will be held on Tuesdays to discuss your progress.  Your Inpatient Rehabilitation Care Coordinator will talk with you frequently to get your input and to update you on team discussions.  Team conferences with you and your family in attendance may also be held.  Expected length of stay: 7-10 days  Overall anticipated outcome: Supervision  Depending on your progress and recovery, your program may change. Your Inpatient Rehabilitation Care Coordinator will coordinate services and will keep you informed of any changes. Your Inpatient Rehabilitation Care Coordinator's name and contact numbers are listed  below.  The following services may also be recommended but are not provided by the [place] will be made to provide these services after discharge if needed.  Arrangements include referral to agencies that provide these services.  Your insurance has been verified to be:  BCBS  Your primary doctor is:  Marilynne Drivers  Pertinent information will be shared with your doctor and your insurance company.  Inpatient Rehabilitation Care Coordinator:  Cathleen Corti [phone] or ([phone]  Information discussed with and copy given to patient by: Rana Snare, 08/28/2021, 10:56 AM

## 2021-08-28 NOTE — Progress Notes (Signed)
PROGRESS NOTE   Subjective/Complaints:  Pt reports didn't have BM yesterday (was documented as such per RN) and feels very constipated.   Had sorbitol yesterday and still didn't go- feels bloated and "yucky".  However, no back pain- no pain at all.    ROS:  Pt denies SOB, abd pain, CP, N/V/C/D, and vision changes  Objective:   No results found. Recent Labs    08/28/21 0519  WBC 10.1  HGB 11.8*  HCT 36.6*  PLT 283   Recent Labs    08/28/21 0519  NA 136  K 4.6  CL 100  CO2 27  GLUCOSE 113*  BUN 13  CREATININE 0.91  CALCIUM 9.0    Intake/Output Summary (Last 24 hours) at 08/28/2021 2010 Last data filed at 08/28/2021 0700 Gross per 24 hour  Intake 720 ml  Output --  Net 720 ml        Physical Exam: Vital Signs Blood pressure 119/80, pulse 64, temperature 98.6 F (37 C), temperature source Oral, resp. rate 16, height _0  (1.702 m), weight 79.5 kg, SpO2 99 %.    General: awake, alert, appropriate, laying supine in bed;  NAD HENT: conjugate gaze; oropharynx moist CV: regular rate; no JVD Pulmonary: CTA B/L; no W/R/R- good air movement GI:  NT: however somewhat distended; more firm but not hard; hypoactive BS Psychiatric: appropriate Neurological: Ox3; mild lisp Skin: No evidence of breakdown, no evidence of rash Neurologic: Cranial nerves II through XII intact, motor strength is 5/5 in bilateral deltoid, bicep, tricep, grip, hip flexor, knee extensors, ankle dorsiflexor and plantar flexor Sensory exam normal sensation to light touch  in bilateral upper and lower extremities  Musculoskeletal: Full range of motion in all 4 extremities. No joint swelling   Skin: intact except steristrips over thoracic back incision- mild swelling over L side of incision.  Neuro: Alert and oriented x4 Musculoskeletal: Thoracic brace in place. Moving lower extremities with full strength   Assessment/Plan: 1.  Functional deficits which require 3+ hours per day of interdisciplinary therapy in a comprehensive inpatient rehab setting. Physiatrist is providing close team supervision and 24 hour management of active medical problems listed below. Physiatrist and rehab team continue to assess barriers to discharge/monitor patient progress toward functional and medical goals  Care Tool:  Bathing    Body parts bathed by patient: Right arm, Left arm, Chest, Abdomen, Buttocks, Front perineal area, Right upper leg, Left upper leg, Right lower leg, Left lower leg, Face         Bathing assist Assist Level: Supervision/Verbal cueing     Upper Body Dressing/Undressing Upper body dressing   What is the patient wearing?: Pull over shirt    Upper body assist Assist Level: Supervision/Verbal cueing    Lower Body Dressing/Undressing Lower body dressing      What is the patient wearing?: Underwear/pull up, Pants     Lower body assist Assist for lower body dressing: Supervision/Verbal cueing     Toileting Toileting Toileting Activity did not occur (Clothing management and hygiene only): N/A (no void or bm)  Toileting assist Assist for toileting: Contact Guard/Touching assist     Transfers Chair/bed transfer  Transfers assist  Chair/bed transfer assist level: Contact Guard/Touching assist     Locomotion Ambulation   Ambulation assist      Assist level: Contact Guard/Touching assist Assistive device: Walker-rolling Max distance: 150'   Walk 10 feet activity   Assist     Assist level: Contact Guard/Touching assist Assistive device: Walker-rolling   Walk 50 feet activity   Assist    Assist level: Contact Guard/Touching assist Assistive device: Walker-rolling    Walk 150 feet activity   Assist Walk 150 feet activity did not occur: Safety/medical concerns  Assist level: Contact Guard/Touching assist Assistive device: Walker-rolling    Walk 10 feet on uneven  surface  activity   Assist Walk 10 feet on uneven surfaces activity did not occur: Safety/medical concerns         Wheelchair     Assist Is the patient using a wheelchair?: Yes Type of Wheelchair: Manual    Wheelchair assist level: Supervision/Verbal cueing Max wheelchair distance: 150'    Wheelchair 50 feet with 2 turns activity    Assist        Assist Level: Supervision/Verbal cueing   Wheelchair 150 feet activity     Assist      Assist Level: Supervision/Verbal cueing   Blood pressure 119/80, pulse 64, temperature 98.6 F (37 C), temperature source Oral, resp. rate 16, height $RemoveBe'5\' 7"'RqRjCodFb$  (1.702 m), weight 79.5 kg, SpO2 99 %.      Medical Problem List and Plan: 1. Functional deficits secondary to stage IV metastatic prostate cancer/lytic and sclerotic bone lesions/cord compression/iliac lymphadenopathy.  Status post T5-6 T6-7 laminectomy transpedicular resection of thoracic spine tumor, T5-6 T6-7 posterior lateral arthrodesis, T5-7 posterior instrumentation pedicle screws and rods 08/19/2021.  Back brace when out of bed.  Patient will follow-up outpatient with Dr. Alen Blew as well as medical oncology             -patient may shower but incision must be covered No evidence of neurogenic bowel or bladder              -ELOS/Goals: 5-10 days modi            con't CIR- PT and OT- team conference today to determine length of stay.  2.  Antithrombotics: -DVT/anticoagulation:  Mechanical: Antiembolism stockings, thigh (TED hose) Bilateral lower extremities check vascular study             -antiplatelet therapy: N/A 3.Postoperative pain: continue Robaxin 500 mg twice daily, oxycodone and Flexeril as needed. Will d/c tylenol given elevated ALT.  1/17- pain controlled- not taking much medicine- con't regimen prn 4. Situational anxiety: Lavender essential oil applied to pillow. Provide emotional support             -antipsychotic agents: N/A 5. Neuropsych: This patient is  capable of making decisions on his own behalf. 6. Skin/Wound Care: Routine skin checks 7. Fluids/Electrolytes/Nutrition: Routine in and outs with follow-up chemistries  1/17- CMP looks good- except LFTs 8.  Hypertension.  Continue Norvasc 10 mg daily, Avapro 150 mg daily.  Monitor with increased mobility  1/16- BP controlled- con't regimen 9.  Constipation.  Continue colace $RemoveBeforeD'100mg'dkygXbsRqsGxMe$  BID, also received dulc supp with 2 lg BM yesterday   1/16- will order Sorbitol 30 cc at 3pm today after therapy to help clean him out.   1/17- no results with sorbitol- will give miralax this Am and follow with 45cc Sorbitol at 3pm today- if no results, will give Soap suds enema this evening.  10. Overweight BMI 29.29: provide  dietary education 11. Elevated ALT: d/c Tylenol  1/16- will recheck in Am to make sure trending down.   1/17- LFTS actually slightly more- 34 to 44 and 81 to 98- will recheck Thursday- also alk phos slightly elevated.  12. Leukocytosis  1/16- will recheck in AM  1/17- WBC down to 10.1k   I spent a total of 36 minutes on total care- >50% coordination of care- team conference and bowel issues.    LOS: 5 days A FACE TO FACE EVALUATION WAS PERFORMED  Clifford Jenkins 08/28/2021, 8:21 AM

## 2021-08-28 NOTE — Progress Notes (Signed)
Occupational Therapy Session Note  Patient Details  Name: Clifford Jenkins MRN: [id number] Date of Birth: 06-May-1961  Today's Date: 08/28/2021 OT Individual Time: 3817-7116 OT Individual Time Calculation (min): 71 min    Short Term Goals: Week 1:  OT Short Term Goal 1 (Week 1): STGs=LTGs due to ELOS  Skilled Therapeutic Interventions/Progress Updates:    OT intervention with focus on bed mobility, standing balance, funcitonal amb with RW, BADL training, and safety awareness to increase independence with BADLs. Functional amb with RW at [facility]. STanding balance with CGA/close supervision. Bathing/dressing with supervision/CGA. Pt dons/doffs TLSO with supervision. Bathing at shower level with supervision. Pt engaged in corn hole with LOBX1 requiring min A. Pt also tossed ball against rebounder with GA. LOBX2 but pt able to self correct. Pt amb from gym back to room and returned to w/c. Pt remained in w/c with all needs within reach. Seat alarm activted.   Therapy Documentation Precautions:  Precautions Precautions: Fall, Back Precaution Comments: Verbally reviewed precautions throughout functional mobility (pt referencing precautions on whiteboard to remember). Will need handout. Required Braces or Orthoses: Spinal Brace Spinal Brace: Thoracolumbosacral orthotic, Applied in sitting position Restrictions Weight Bearing Restrictions: No   Pain:  Pt dneies pain this morning   Therapy/Group: Individual Therapy  Leroy Libman 08/28/2021, 9:29 AM

## 2021-08-29 NOTE — Progress Notes (Signed)
Occupational Therapy Session Note  Patient Details  Name: Clifford Jenkins MRN: [id number] Date of Birth: [birth year]  Today's Date: 08/29/2021 OT Individual Time: 1300-1330 OT Individual Time Calculation (min): 30 min    Short Term Goals: Week 1:  OT Short Term Goal 1 (Week 1): STGs=LTGs due to ELOS  Skilled Therapeutic Interventions/Progress Updates:    OT intervention with focus on w/c mobility and functional amb with RW in community setting on uneven pavement. W/c mobility with supervision. Amb with RW in courtyard with supervision and min verba cues for safety awareness with changes in surface. Pt returned to room and remained in w/c with all needs within reach.  Therapy Documentation Precautions:  Precautions Precautions: Fall, Back Precaution Comments: Verbally reviewed precautions throughout functional mobility (pt referencing precautions on whiteboard to remember). Will need handout. Required Braces or Orthoses: Spinal Brace Spinal Brace: Thoracolumbosacral orthotic, Applied in sitting position Restrictions Weight Bearing Restrictions: No   Pain:  Pt denies pain this afternoon Therapy/Group: Individual Therapy  Leroy Libman 08/29/2021, 1:57 PM

## 2021-08-29 NOTE — Discharge Summary (Signed)
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 admitted for a comprehensive rehab program   Hospital Course: Clifford Jenkins was admitted to rehab 08/23/2021 for inpatient therapies to consist of PT, ST and OT at least three hours five days a week. Past admission physiatrist, therapy team and rehab RN have worked together to provide customized collaborative inpatient rehab.  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  Status post T5-6 6-7 laminectomy transpedicular resection 08/19/2021 per Dr. Arnoldo Morale.  Back brace when out of bed.  Surgical site healing nicely.  Patient would follow-up outpatient Dr. Alen Blew as well as medical oncology radiation for ongoing care.  SCDs for DVT prophylaxis.  Postoperative pain with Robaxin oxycodone and Flexeril as advised.  Bouts of constipation resolved with laxative assistance.  Blood pressure controlled on present regimen of Norvasc and Avapro follow-up outpatient.   Blood pressures were monitored on TID basis and soft and monitored     Rehab course: During patient's stay in rehab weekly team conferences were held to monitor patient's progress, set goals and discuss barriers to discharge. At admission, patient required minimal assist sit to stand moderate assist 48 feet  rolling walker  Physical exam.  Blood pressure 130/77 pulse 91 temperature 100 respirations 16 oxygen saturations 100% room air Constitutional.  No acute distress HEENT Head.  Normocephalic and atraumatic Eyes.  Pupils round and reactive to light no discharge.nystagmus Neck.  Supple nontender no JVD without thyromegaly Cardiac regular rate and rhythm any extra sounds or murmur heard Abdomen.  Soft nontender positive bowel sounds without rebound Respiratory effort normal no respiratory distress without wheeze Neurologic.  Alert oriented thoracic back brace in place.  Moving lower extremities with full strength  He/She  has had improvement in activity tolerance, balance, postural control as well as ability to compensate for deficits. He/She has had improvement in functional use RUE/LUE  and RLE/LLE as well as improvement in awareness.  Ambulates 150 feet x 2 rolling walker close supervision.  Up-and-down stairs contact-guard assist.  Back brace when out of  bed.  Got his belongings for activities day living homemaking.  Full family teaching completed plan discharged to home       Disposition: Discharge to home    Diet: Regular  Special Instructions: No driving smoking or alcohol  Back brace when out of bed  Medications at discharge 1.  Norvasc 10 mg p.o. daily 2.  Flexeril 10 mg p.o. 3 times daily as needed 3.  Colace 100 mg p.o. twice daily 4.  Avapro 150 mg p.o. daily 5.  Robaxin 500 mg p.o. twice daily 6.  Oxycodone 10 mg every 4 hours as needed moderate pain 7.  MiraLAX daily hold for loose stools  30-35 minutes were spent completing discharge summary and discharge planning  Discharge Instructions     Ambulatory referral to Occupational Therapy   Complete by: As directed    Evaluate and treat   Ambulatory referral to Physical Therapy   Complete by: As directed    Evaluate and treat        Follow-up Information     Lovorn, Jinny Blossom, MD Follow up.   Specialty:  Physical Medicine and Rehabilitation Why: No formal followup needed Contact information: [address] [place] [address] [phone]         Newman Pies, MD Follow up.   Specialty: Neurosurgery Why: Call for appointment Contact information: [address] [place] [address] [phone]         Wyatt Portela, MD Follow up.   Specialty: Oncology Why: Call for appointment Contact information: [place] [address] [phone]                 Signed: Cathlyn Parsons 08/31/2021, 5:26 AM

## 2021-08-29 NOTE — Progress Notes (Signed)
Patient ID: NEVAEH CASILLAS, male   DOB: 1961/01/12, 61 y.o.   MRN: [id number]  SW received message from pt s/o [name] stating she will not be here for family edu on Thursday but here today from 2:30pm-4pm. SW updated medical team.   SW faxed outpatient PT/OT order to [facility] ([phone]/f:[phone]).   Loralee Pacas, MSW, [place] Office: [phone] Cell: [phone] Fax: [phone]

## 2021-08-29 NOTE — Progress Notes (Signed)
Physical Therapy Discharge Summary  Patient Details  Name: Clifford Jenkins MRN: [id number] Date of Birth: 03-15-1961  Today's Date: 08/30/2021   Patient has met 12 of 12 long term goals due to improved activity tolerance, improved balance, improved postural control, increased strength, and ability to compensate for deficits.  Patient to discharge at an ambulatory level Modified Independent.   Patient's care partner is independent to provide the necessary physical assistance at discharge. Pt's girlfriend Clifford Jenkins has completed family education and is safe to assist pt upon d/c home. Pt will be at mod I to Supervision level and will not require 24/7 Supervision upon d/c home.  Reasons goals not met: N/A all goals met  Recommendation:  Patient will benefit from ongoing skilled PT services in outpatient setting to continue to advance safe functional mobility, address ongoing impairments in endurance, strength, balance, safety, independence with functional mobility, and minimize fall risk.  Equipment: RW and 18x18 w/c  Reasons for discharge: treatment goals met and discharge from hospital  Patient/family agrees with progress made and goals achieved: Yes  PT Discharge Precautions/Restrictions Precautions Precautions: Fall;Back Required Braces or Orthoses: Spinal Brace Spinal Brace: Thoracolumbosacral orthotic;Applied in sitting position Restrictions Weight Bearing Restrictions: No Vital Signs   Pain Pain Assessment Pain Scale: 0-10 Pain Score: 0-No pain Pain Interference Pain Interference Pain Effect on Sleep: 1. Rarely or not at all Pain Interference with Therapy Activities: 1. Rarely or not at all Pain Interference with Day-to-Day Activities: 1. Rarely or not at all Vision/Perception  Vision - History Ability to See in Adequate Light: 0 Adequate Perception Perception: Within Functional Limits Praxis Praxis: Intact  Cognition Overall Cognitive Status: Within Functional  Limits for tasks assessed Arousal/Alertness: Awake/alert Orientation Level: Oriented X4 Sensation Sensation Light Touch: Appears Intact Hot/Cold: Appears Intact Proprioception: Impaired Detail Proprioception Impaired Details: Impaired RLE;Impaired LLE Coordination Gross Motor Movements are Fluid and Coordinated: Yes Fine Motor Movements are Fluid and Coordinated: Yes Coordination and Movement Description: Ataxia with LE movement still present Motor  Motor Motor: Abnormal postural alignment and control;Ataxia Motor - Discharge Observations: Ataxia still present but improved  Mobility Bed Mobility Rolling Right: Independent with assistive device Rolling Left: Independent with assistive device Supine to Sit: Independent with assistive device Sit to Supine: Independent with assistive device Transfers Transfers: Sit to Stand;Stand Pivot Transfers Sit to Stand: Independent with assistive device Stand Pivot Transfers: Independent with assistive device Transfer (Assistive device): Rolling walker Locomotion  Gait Ambulation: Yes Gait Assistance: Independent with assistive device Gait Distance (Feet): 250 Feet Assistive device: Rolling walker Gait Gait: Yes Gait Pattern: Impaired Gait Pattern: Ataxic Gait velocity: Decreased High Level Ambulation High Level Ambulation: Side stepping;Backwards walking Side Stepping: requires minA without AD Backwards Walking: requires minA without AD Stairs / Additional Locomotion Stairs: Yes Stairs Assistance: Supervision/Verbal cueing Stair Management Technique: Two rails Number of Stairs: 28 Height of Stairs: 6 Ramp: Supervision/Verbal cueing Curb: Engineer, maintenance (IT) Mobility: Yes Wheelchair Assistance: Set up Lexicographer: Both upper extremities Wheelchair Parts Management: Needs assistance Distance: 320f  Trunk/Postural Assessment  Cervical Assessment Cervical Assessment:  Within Functional Limits Thoracic Assessment Thoracic Assessment: Exceptions to WFL (TLSO and back precautions) Lumbar Assessment Lumbar Assessment: Exceptions to WFL (TLSO and back precautions) Postural Control Postural Control: Deficits on evaluation  Balance Balance Balance Assessed: Yes Standardized Balance Assessment Standardized Balance Assessment: Timed Up and Go Test Timed Up and Go Test TUG: Normal TUG Normal TUG (seconds): 17.8 Static Sitting Balance Static Sitting - Balance Support: No upper  extremity supported;Feet supported Static Sitting - Level of Assistance: 6: Modified independent (Device/Increase time) Dynamic Sitting Balance Dynamic Sitting - Balance Support: During functional activity Dynamic Sitting - Level of Assistance: 6: Modified independent (Device/Increase time) Static Standing Balance Static Standing - Balance Support: Bilateral upper extremity supported;During functional activity Static Standing - Level of Assistance: 6: Modified independent (Device/Increase time) Dynamic Standing Balance Dynamic Standing - Balance Support: Bilateral upper extremity supported;During functional activity Dynamic Standing - Level of Assistance: 5: Stand by assistance Extremity Assessment      RLE Assessment RLE Assessment: Within Functional Limits LLE Assessment LLE Assessment: Within Functional Limits    Rosita DeChalus  Excell Seltzer, PT, DPT, CSRS  08/30/2021, 4:11 PM

## 2021-08-29 NOTE — Progress Notes (Signed)
PROGRESS NOTE   Subjective/Complaints:   Pt reports had 2 BM's- feeling much better- no enema required.  No significant back pain.    ROS:  Pt denies SOB, abd pain, CP, N/V/C/D, and vision changes   Objective:   No results found. Recent Labs    08/28/21 0519  WBC 10.1  HGB 11.8*  HCT 36.6*  PLT 283   Recent Labs    08/28/21 0519  NA 136  K 4.6  CL 100  CO2 27  GLUCOSE 113*  BUN 13  CREATININE 0.91  CALCIUM 9.0    Intake/Output Summary (Last 24 hours) at 08/29/2021 0806 Last data filed at 08/28/2021 1839 Gross per 24 hour  Intake 360 ml  Output --  Net 360 ml        Physical Exam: Vital Signs Blood pressure 119/76, pulse 76, temperature 97.8 F (36.6 C), temperature source Oral, resp. rate 16, height _0  (1.702 m), weight 79.5 kg, SpO2 98 %.     General: awake, alert, appropriate, sitting up in bed <30 degrees; not wearing TLSO right now; NAD HENT: conjugate gaze; oropharynx moist CV: regular rate; no JVD Pulmonary: CTA B/L; no W/R/R- good air movement GI: soft, NT, ND, (+)BS- not distended anymore Psychiatric: appropriate Neurological: Ox3; mild lisp- since admission Skin: No evidence of breakdown, no evidence of rash Neurologic: Cranial nerves II through XII intact, motor strength is 5/5 in bilateral deltoid, bicep, tricep, grip, hip flexor, knee extensors, ankle dorsiflexor and plantar flexor Sensory exam normal sensation to light touch  in bilateral upper and lower extremities  Musculoskeletal: Full range of motion in all 4 extremities. No joint swelling   Skin: intact except steristrips over thoracic back incision- mild swelling over L side of incision.  Neuro: Alert and oriented x4 Musculoskeletal: Thoracic brace in place. Moving lower extremities with full strength   Assessment/Plan: 1. Functional deficits which require 3+ hours per day of interdisciplinary therapy in a  comprehensive inpatient rehab setting. Physiatrist is providing close team supervision and 24 hour management of active medical problems listed below. Physiatrist and rehab team continue to assess barriers to discharge/monitor patient progress toward functional and medical goals  Care Tool:  Bathing    Body parts bathed by patient: Right arm, Left arm, Chest, Abdomen, Buttocks, Front perineal area, Right upper leg, Left upper leg, Right lower leg, Left lower leg, Face         Bathing assist Assist Level: Supervision/Verbal cueing     Upper Body Dressing/Undressing Upper body dressing   What is the patient wearing?: Pull over shirt, Orthosis    Upper body assist Assist Level: Supervision/Verbal cueing    Lower Body Dressing/Undressing Lower body dressing      What is the patient wearing?: Underwear/pull up, Pants     Lower body assist Assist for lower body dressing: Supervision/Verbal cueing     Toileting Toileting Toileting Activity did not occur (Clothing management and hygiene only): N/A (no void or bm)  Toileting assist Assist for toileting: Contact Guard/Touching assist     Transfers Chair/bed transfer  Transfers assist     Chair/bed transfer assist level: Contact Guard/Touching assist  Locomotion Ambulation   Ambulation assist      Assist level: Contact Guard/Touching assist Assistive device: Walker-rolling Max distance: 150'   Walk 10 feet activity   Assist     Assist level: Contact Guard/Touching assist Assistive device: Walker-rolling   Walk 50 feet activity   Assist    Assist level: Contact Guard/Touching assist Assistive device: Walker-rolling    Walk 150 feet activity   Assist Walk 150 feet activity did not occur: Safety/medical concerns  Assist level: Contact Guard/Touching assist Assistive device: Walker-rolling    Walk 10 feet on uneven surface  activity   Assist Walk 10 feet on uneven surfaces activity did not  occur: Safety/medical concerns         Wheelchair     Assist Is the patient using a wheelchair?: Yes Type of Wheelchair: Manual    Wheelchair assist level: Supervision/Verbal cueing Max wheelchair distance: 150'    Wheelchair 50 feet with 2 turns activity    Assist        Assist Level: Supervision/Verbal cueing   Wheelchair 150 feet activity     Assist      Assist Level: Supervision/Verbal cueing   Blood pressure 119/76, pulse 76, temperature 97.8 F (36.6 C), temperature source Oral, resp. rate 16, height _0  (1.702 m), weight 79.5 kg, SpO2 98 %.      Medical Problem List and Plan: 1. Functional deficits secondary to stage IV metastatic prostate cancer/lytic and sclerotic bone lesions/cord compression/iliac lymphadenopathy.  Status post T5-6 T6-7 laminectomy transpedicular resection of thoracic spine tumor, T5-6 T6-7 posterior lateral arthrodesis, T5-7 posterior instrumentation pedicle screws and rods 08/19/2021.  Back brace when out of bed.  Patient will follow-up outpatient with Dr. Alen Blew as well as medical oncology             -patient may shower but incision must be covered No evidence of neurogenic bowel or bladder              -ELOS/Goals: 5-10 days modi            con't CIR_  d/c 1/20- con't CIR- PT and OT 2.  Antithrombotics: -DVT/anticoagulation:  Mechanical: Antiembolism stockings, thigh (TED hose) Bilateral lower extremities check vascular study             -antiplatelet therapy: N/A 3.Postoperative pain: continue Robaxin 500 mg twice daily, oxycodone and Flexeril as needed. Will d/c tylenol given elevated ALT.  1/17- pain controlled- not taking much medicine- con't regimen prn 4. Situational anxiety: Lavender essential oil applied to pillow. Provide emotional support             -antipsychotic agents: N/A 5. Neuropsych: This patient is capable of making decisions on his own behalf. 6. Skin/Wound Care: Routine skin checks 7.  Fluids/Electrolytes/Nutrition: Routine in and outs with follow-up chemistries  1/17- CMP looks good- except LFTs 8.  Hypertension.  Continue Norvasc 10 mg daily, Avapro 150 mg daily.  Monitor with increased mobility  1/16- BP controlled- con't regimen 9.  Constipation.  Continue colace 133m BID, also received dulc supp with 2 lg BM yesterday   1/16- will order Sorbitol 30 cc at 3pm today after therapy to help clean him out.   1/17- no results with sorbitol- will give miralax this Am and follow with 45cc Sorbitol at 3pm today- if no results, will give Soap suds enema this evening.   1/18- cleaned out- con't regimen 10. Overweight BMI 29.29: provide dietary education 11. Elevated ALT: d/c Tylenol  1/16-  will recheck in Am to make sure trending down.   1/17- LFTS actually slightly more- 34 to 44 and 81 to 98- will recheck Thursday- also alk phos slightly elevated.   1/18- labs tomorrow 12. Leukocytosis  1/16- will recheck in AM  1/17- WBC down to 10.1k     LOS: 6 days A FACE TO FACE EVALUATION WAS PERFORMED  Annajulia Lewing 08/29/2021, 8:06 AM

## 2021-08-29 NOTE — Progress Notes (Signed)
Occupational Therapy Discharge Summary  Patient Details  Name: Clifford Jenkins MRN: [id number] Date of Birth: 10-26-1960    Patient has met 9 of 9 goals. Pt made excellent progress with BADLs during this admission. Pt is supervision for bathing and LB dressing tasks. Mod I for toilet tranfsers with RW and toileting. Pt is mod I for UB dressing, including donning/doffing TLSO. Pt will have intermittent supervision at discharge. Patient to discharge at overall Modified Independent level.  Patient's care partner is independent to provide the necessary physical assistance at discharge.    Reasons goals not met: NA  Recommendation:  No further OT recommended. Will f/u OPPT.  Equipment: BSC  Reasons for discharge: treatment goals met and discharge from hospital  Patient/family agrees with progress made and goals achieved: Yes  OT Discharge Vision Baseline Vision/History: 0 No visual deficits Patient Visual Report: No change from baseline Vision Assessment?: No apparent visual deficits Perception  Perception: Within Functional Limits Praxis Praxis: Intact Cognition Overall Cognitive Status: Within Functional Limits for tasks assessed Arousal/Alertness: Awake/alert Year: 2023 Month: January Day of Week: Correct Attention: Focused;Sustained Focused Attention: Appears intact Sustained Attention: Appears intact Immediate Memory Recall: Sock;Blue;Bed Memory Recall Sock: Without Cue Memory Recall Blue: Without Cue Memory Recall Bed: Not able to recall Awareness: Appears intact Problem Solving: Appears intact Safety/Judgment: Appears intact Sensation Sensation Light Touch: Appears Intact Hot/Cold: Appears Intact Proprioception: Impaired Detail Proprioception Impaired Details: Impaired RLE;Impaired LLE Coordination Gross Motor Movements are Fluid and Coordinated: Yes Fine Motor Movements are Fluid and Coordinated: Yes 9 Hole Peg Test: WNL Motor  Motor Motor: Abnormal  postural alignment and control;Ataxia Motor - Skilled Clinical Observations: impaired 2/2 BLE weakness     Trunk/Postural Assessment  Cervical Assessment Cervical Assessment: Within Functional Limits Thoracic Assessment Thoracic Assessment: Exceptions to WFL (TLSO and back precautions) Lumbar Assessment Lumbar Assessment: Exceptions to WFL (TLOS and back precautions)  Balance Static Sitting Balance Static Sitting - Level of Assistance: 6: Modified independent (Device/Increase time) Dynamic Sitting Balance Dynamic Sitting - Balance Support: During functional activity Dynamic Sitting - Level of Assistance: 6: Modified independent (Device/Increase time) Extremity/Trunk Assessment RUE Assessment RUE Assessment: Within Functional Limits Active Range of Motion (AROM) Comments: WNL LUE Assessment LUE Assessment: Within Functional Limits Active Range of Motion (AROM) Comments: WNL   Leroy Libman 08/29/2021, 1:00 PM

## 2021-08-29 NOTE — Progress Notes (Addendum)
Occupational Therapy Session Note  Patient Details  Name: Clifford Jenkins MRN: [id number] Date of Birth: [birth year]  Today's Date: 08/29/2021 OT Individual Time: 1601-0932 OT Individual Time Calculation (min): 75 min    Short Term Goals: Week 1:  OT Short Term Goal 1 (Week 1): STGs=LTGs due to ELOS  Skilled Therapeutic Interventions/Progress Updates:    OT intervention with focus on functional amb with RW, standing balance, bathing at shower level, dressing with sit<>stand from w/c, and safety awareness. Amb with RW at CGA/close supervision. Bathing at shower level with supervision. UB dressing (including TLSO) with supervision. LB dressing with supervision. Pt stood at sink to brush teeth. Pt amb with RW to gym and tossed 1kg ball against rebounder while standing on Airex to address standing balance. Pt required CGA for task. Pt transitioned to ortho gym for BITS activitiy standing on Airex. Pt able to complete task with no UE support and CGA-reaction time .99 secs. Pt returned to room and remained seated in w/c with seat alarm activated.   Therapy Documentation Precautions:  Precautions Precautions: Fall, Back Precaution Comments: Verbally reviewed precautions throughout functional mobility (pt referencing precautions on whiteboard to remember). Will need handout. Required Braces or Orthoses: Spinal Brace Spinal Brace: Thoracolumbosacral orthotic, Applied in sitting position Restrictions Weight Bearing Restrictions: No   Pain:  Pt denies pain this morning.  Therapy/Group: Individual Therapy  Leroy Libman 08/29/2021, 12:18 PM

## 2021-08-29 NOTE — Plan of Care (Signed)
°  Problem: Sit to Stand Goal: LTG:  Patient will perform sit to stand in prep for activites of daily living with assistance level (OT) Description: LTG:  Patient will perform sit to stand in prep for activites of daily living with assistance level (OT) Flowsheets (Taken 08/29/2021 0739) LTG: PT will perform sit to stand in prep for activites of daily living with assistance level: (upgraded JLS) Independent with assistive device Note: upgraded JLS    Problem: RH Grooming Goal: LTG Patient will perform grooming w/assist,cues/equip (OT) Description: LTG: Patient will perform grooming with assist, with/without cues using equipment (OT) Flowsheets (Taken 08/29/2021 0739) LTG: Pt will perform grooming with assistance level of: (upgraded JLS) Independent   Problem: RH Dressing Goal: LTG Patient will perform upper body dressing (OT) Description: LTG Patient will perform upper body dressing with assist, with/without cues (OT). Flowsheets (Taken 08/29/2021 0739) LTG: Pt will perform upper body dressing with assistance level of: (upgraded JLS) Independent with assistive device Note: upgraded JLS    Problem: RH Toileting Goal: LTG Patient will perform toileting task (3/3 steps) with assistance level (OT) Description: LTG: Patient will perform toileting task (3/3 steps) with assistance level (OT)  Flowsheets (Taken 08/29/2021 0739) LTG: Pt will perform toileting task (3/3 steps) with assistance level: (upgraded JLS) Independent with assistive device Note: upgraded JLS

## 2021-08-29 NOTE — Progress Notes (Signed)
Physical Therapy Session Note  Patient Details  Name: Clifford Jenkins MRN: [id number] Date of Birth: 31-Oct-1960  Today's Date: 08/29/2021 PT Individual Time: 0900-0930; 1445-1600 PT Individual Time Calculation (min): 30 min and 75 min  Short Term Goals: Week 1:  PT Short Term Goal 1 (Week 1): =LTG due to ELOS  Skilled Therapeutic Interventions/Progress Updates:    Session 1: Pt received seated in bed, agreeable to PT session. No complaints of pain. Pt reports feeling much better this AM as he was able to have a BM last night finally. Bed mobility Supervision with use of bedrail. Pt is able to don TLSO while seated EOB with setup A. Sit to stand with Supervision to RW during session. Ambulatory transfer into bathroom with RW and Supervision. Toilet transfer with use of RW, grab bar, and Supervision. Pt is independent for toileting steps. Ambulation 2 x 200 ft with RW and Supervision for balance, occasional scissoring of LE and shoulder elevation due to heavy UE reliance on RW. Standing alt L/R cone taps with RW and CGA for balance, 3 x 30 reps to fatigue for LE coordination training. Pt left seated in w/c in room with needs in reach at end of session.  Session 2: Pt received seated in w/c in room, agreeable to PT session. No complaints of pain. Pt's girlfriend [name] present for family education session. Sit to stand with Supervision and RW throughout session. Ambulation up to 200 ft with RW at Supervision level, cues for decreased shoulder elevation. Car transfer with RW and Supervision with cues for safe transfer technique and safe use of equipment. Ascend/descend 28 x 6" stairs with 2 handrails at Supervision level with alternating gait pattern for LE strengthening. Sit to/from supine on real bed in therapy apartment at Supervision level. Sit to stand from low, pliable surface of couch with CGA and RW. Pt's girlfriend understanding of pt's current assist level and goal level of Supervision to mod  I at d/c. Ambulation through obstacle course weaving through cones, sidestepping through cones, stepping over obstacles, with RW and CGA and standing on airex with no UE support x 30 sec with CGA to min A. Pt performed TUG and scores an average of 17.8 sec after 3 trials with use of RW at Supervision level. This score indicates a high fall risk, reviewed score and functional implications with patient. Pt returned to room and left seated in w/c with needs in reach, chair alarm in place, [place] present.   Therapy Documentation Precautions:  Precautions Precautions: Fall, Back Precaution Comments: Verbally reviewed precautions throughout functional mobility (pt referencing precautions on whiteboard to remember). Will need handout. Required Braces or Orthoses: Spinal Brace Spinal Brace: Thoracolumbosacral orthotic, Applied in sitting position Restrictions Weight Bearing Restrictions: No     Therapy/Group: Individual Therapy   Excell Seltzer, PT, DPT, CSRS  08/29/2021, 10:18 AM

## 2021-08-30 ENCOUNTER — Other Ambulatory Visit (HOSPITAL_COMMUNITY): Payer: Self-pay

## 2021-08-30 LAB — COMPREHENSIVE METABOLIC PANEL
ALT: 95 U/L — ABNORMAL HIGH (ref 0–44)
AST: 41 U/L (ref 15–41)
Albumin: 2.6 g/dL — ABNORMAL LOW (ref 3.5–5.0)
Alkaline Phosphatase: 175 U/L — ABNORMAL HIGH (ref 38–126)
Anion gap: 6 (ref 5–15)
BUN: 15 mg/dL (ref 6–20)
CO2: 30 mmol/L (ref 22–32)
Calcium: 8.8 mg/dL — ABNORMAL LOW (ref 8.9–10.3)
Chloride: 102 mmol/L (ref 98–111)
Creatinine, Ser: 1.12 mg/dL (ref 0.61–1.24)
GFR, Estimated: >60 mL/min (ref >60)
Glucose, Bld: 112 mg/dL — ABNORMAL HIGH (ref 70–99)
Potassium: 4.4 mmol/L (ref 3.5–5.1)
Sodium: 138 mmol/L (ref 135–145)
Total Bilirubin: 0.5 mg/dL (ref 0.3–1.2)
Total Protein: 6.8 g/dL (ref 6.5–8.1)

## 2021-08-30 MED ORDER — METHOCARBAMOL 500 MG PO TABS
500.0000 mg | ORAL_TABLET | Freq: Two times a day (BID) | ORAL | 0 refills | Status: DC
  Filled 2021-08-30: qty 30, 15d supply, fill #0

## 2021-08-30 MED ORDER — AMLODIPINE BESYLATE 10 MG PO TABS
10.0000 mg | ORAL_TABLET | Freq: Every day | ORAL | 0 refills | Status: DC
  Filled 2021-08-30: qty 30, 30d supply, fill #0

## 2021-08-30 MED ORDER — CYCLOBENZAPRINE HCL 10 MG PO TABS
10.0000 mg | ORAL_TABLET | Freq: Three times a day (TID) | ORAL | 0 refills | Status: DC | PRN
  Filled 2021-08-30: qty 60, 20d supply, fill #0

## 2021-08-30 MED ORDER — OXYCODONE HCL 10 MG PO TABS
10.0000 mg | ORAL_TABLET | ORAL | 0 refills | Status: DC | PRN
  Filled 2021-08-30: qty 30, 5d supply, fill #0

## 2021-08-30 MED ORDER — POLYETHYLENE GLYCOL 3350 17 G PO PACK: 17.0000 g | PACK | Freq: Every day | ORAL | 0 refills | Status: DC

## 2021-08-30 MED ORDER — VALSARTAN 160 MG PO TABS
160.0000 mg | ORAL_TABLET | Freq: Every day | ORAL | 0 refills | Status: DC
  Filled 2021-08-30: qty 30, 30d supply, fill #0

## 2021-08-30 NOTE — Progress Notes (Signed)
PROGRESS NOTE   Subjective/Complaints:   Pt reports no back pain- feeling goof-  Worried will get constipated at home- now on Miralax 1 capful daily- LBM 2 days ago, but got cleaned out-   We discussed will send home with 7 days of pain meds- likely won't need more per pt.    ROS:  Pt denies SOB, abd pain, CP, N/V/C/D, and vision changes    Objective:   No results found. Recent Labs    08/28/21 0519  WBC 10.1  HGB 11.8*  HCT 36.6*  PLT 283   Recent Labs    08/28/21 0519 08/30/21 0528  NA 136 138  K 4.6 4.4  CL 100 102  CO2 27 30  GLUCOSE 113* 112*  BUN 13 15  CREATININE 0.91 1.12  CALCIUM 9.0 8.8*    Intake/Output Summary (Last 24 hours) at 08/30/2021 0913 Last data filed at 08/30/2021 0700 Gross per 24 hour  Intake 960 ml  Output --  Net 960 ml        Physical Exam: Vital Signs Blood pressure 115/80, pulse 61, temperature 98.5 F (36.9 C), temperature source Oral, resp. rate 14, height _0  (1.702 m), weight 79.5 kg, SpO2 98 %.      General: awake, alert, appropriate, sitting up with TLSO on; with OT; bedside chair; NAD HENT: conjugate gaze; oropharynx moist CV: regular rate; no JVD Pulmonary: CTA B/L; no W/R/R- good air movement GI: soft, NT, ND, (+)BS Psychiatric: appropriate Neurological: Ox3; mild lisp- chronic Skin: No evidence of breakdown, no evidence of rash- incision looks good- but is moderately puffy on both sides- no erythema;; no heat; steristrips in place Neurologic: Cranial nerves II through XII intact, motor strength is 5/5 in bilateral deltoid, bicep, tricep, grip, hip flexor, knee extensors, ankle dorsiflexor and plantar flexor Sensory exam normal sensation to light touch  in bilateral upper and lower extremities  Musculoskeletal: Full range of motion in all 4 extremities. No joint swelling Neuro: Alert and oriented x4 Musculoskeletal: Thoracic brace in place. Moving  lower extremities with full strength   Assessment/Plan: 1. Functional deficits which require 3+ hours per day of interdisciplinary therapy in a comprehensive inpatient rehab setting. Physiatrist is providing close team supervision and 24 hour management of active medical problems listed below. Physiatrist and rehab team continue to assess barriers to discharge/monitor patient progress toward functional and medical goals  Care Tool:  Bathing    Body parts bathed by patient: Right arm, Left arm, Chest, Abdomen, Buttocks, Front perineal area, Right upper leg, Left upper leg, Right lower leg, Left lower leg, Face         Bathing assist Assist Level: Supervision/Verbal cueing     Upper Body Dressing/Undressing Upper body dressing   What is the patient wearing?: Pull over shirt, Orthosis    Upper body assist Assist Level: Supervision/Verbal cueing    Lower Body Dressing/Undressing Lower body dressing      What is the patient wearing?: Underwear/pull up, Pants     Lower body assist Assist for lower body dressing: Supervision/Verbal cueing     Toileting Toileting Toileting Activity did not occur (Clothing management and hygiene only): N/A (no void  or bm)  Toileting assist Assist for toileting: Contact Guard/Touching assist     Transfers Chair/bed transfer  Transfers assist     Chair/bed transfer assist level: Supervision/Verbal cueing     Locomotion Ambulation   Ambulation assist      Assist level: Supervision/Verbal cueing Assistive device: Walker-rolling Max distance: 200'   Walk 10 feet activity   Assist     Assist level: Supervision/Verbal cueing Assistive device: Walker-rolling   Walk 50 feet activity   Assist    Assist level: Supervision/Verbal cueing Assistive device: Walker-rolling    Walk 150 feet activity   Assist Walk 150 feet activity did not occur: Safety/medical concerns  Assist level: Supervision/Verbal cueing Assistive  device: Walker-rolling    Walk 10 feet on uneven surface  activity   Assist Walk 10 feet on uneven surfaces activity did not occur: Safety/medical concerns         Wheelchair     Assist Is the patient using a wheelchair?: Yes Type of Wheelchair: Manual    Wheelchair assist level: Supervision/Verbal cueing Max wheelchair distance: 150'    Wheelchair 50 feet with 2 turns activity    Assist        Assist Level: Supervision/Verbal cueing   Wheelchair 150 feet activity     Assist      Assist Level: Supervision/Verbal cueing   Blood pressure 115/80, pulse 61, temperature 98.5 F (36.9 C), temperature source Oral, resp. rate 14, height $RemoveBe'5\' 7"'CXTZGExwq$  (1.702 m), weight 79.5 kg, SpO2 98 %.      Medical Problem List and Plan: 1. Functional deficits secondary to stage IV metastatic prostate cancer/lytic and sclerotic bone lesions/cord compression/iliac lymphadenopathy.  Status post T5-6 T6-7 laminectomy transpedicular resection of thoracic spine tumor, T5-6 T6-7 posterior lateral arthrodesis, T5-7 posterior instrumentation pedicle screws and rods 08/19/2021.  Back brace when out of bed.  Patient will follow-up outpatient with Dr. Alen Blew as well as medical oncology             -patient may shower but incision must be covered No evidence of neurogenic bowel or bladder              -ELOS/Goals: 5-10 days modi           con't CIR_ PT and OT- d/c tomorrow- will have pt f/u with surgeon- doesn't need f/u with me.  2.  Antithrombotics: -DVT/anticoagulation:  Mechanical: Antiembolism stockings, thigh (TED hose) Bilateral lower extremities check vascular study             -antiplatelet therapy: N/A 3.Postoperative pain: continue Robaxin 500 mg twice daily, oxycodone and Flexeril as needed. Will d/c tylenol given elevated ALT.  1/17- pain controlled- not taking much medicine- con't regimen prn 4. Situational anxiety: Lavender essential oil applied to pillow. Provide emotional  support             -antipsychotic agents: N/A 5. Neuropsych: This patient is capable of making decisions on his own behalf. 6. Skin/Wound Care: Routine skin checks 7. Fluids/Electrolytes/Nutrition: Routine in and outs with follow-up chemistries  1/17- CMP looks good- except LFTs 8.  Hypertension.  Continue Norvasc 10 mg daily, Avapro 150 mg daily.  Monitor with increased mobility  1/16- BP controlled- con't regimen 9.  Constipation.  Continue colace $RemoveBeforeD'100mg'[id number]$  BID, also received dulc supp with 2 lg BM yesterday   1/16- will order Sorbitol 30 cc at 3pm today after therapy to help clean him out.   1/17- no results with sorbitol- will give miralax this Am  and follow with 45cc Sorbitol at 3pm today- if no results, will give Soap suds enema this evening.   1/18- cleaned out- con't regimen  1/19- on miralax daily- educated pt that if gets constipated at home, suggest taking miralax 2 capfuls- since reducing pain meds, should be better 10. Overweight BMI 29.29: provide dietary education 11. Elevated ALT: d/c Tylenol  1/16- will recheck in Am to make sure trending down.   1/17- LFTS actually slightly more- 34 to 44 and 81 to 98- will recheck Thursday- also alk phos slightly elevated.   1/18- labs tomorrow  1/19- alk phose mild elevation likely due to prostate CA- ALT still 95- AST down slightly to 41 into normal range- prostate CA_ needs to f/u with PCP.  12. Leukocytosis  1/16- will recheck in AM  1/17- WBC down to 10.1k   I spent a total of 35 minutes on total care- >50% coordination of care- d/w pt about how to treat constipation, d/c plans; and d/w PA about meds and f/u.   LOS: 7 days A FACE TO FACE EVALUATION WAS PERFORMED  Glennice Marcos 08/30/2021, 9:13 AM

## 2021-08-30 NOTE — Progress Notes (Signed)
Occupational Therapy Session Note  Patient Details  Name: Clifford Jenkins MRN: [id number] Date of Birth: 03-26-1961  Today's Date: 08/30/2021 OT Individual Time: 6314-9702 and 6378-5885 OT Individual Time Calculation (min): 58 min and 27 min   Short Term Goals: Week 1:  OT Short Term Goal 1 (Week 1): STGs=LTGs due to ELOS   Skilled Therapeutic Interventions/Progress Updates:    Pt greeted at time of session bed level resting agreeable to OT session and motivated. No pain throughout session. Pt aware of grad day and wanting to take a shower. Pt performing donning/doffing TLSO Mod I, shower level bathing Mod I including gathering items for shower, UB/LB dress sitting on EOB Mod I within back preacutions and using compensatory techniques. OT assisting with covering back incision for shower and demonstrating/educating pt on how to direct s/o to do this for him. Ambulating throughout hall at end of ADL with Mod I with RW approx 300+ feet without fatigue. Pt with no further questions. Back in room set up in wheelchair call bell in reach all needs met.   Session 2: Pt greeted at time of session sitting up in wheelchair agreeable to OT session, no pain. ADL needs met. Pt wanting to walk to gym and practice "stairs" for functional hip/knee flexion pattern to improve ability to clear feet per pt request. Ambulated room <> main gym with Mod I and RW, performed 2x20 step ups alternating each LE and using rails for support, no LOB noted and good form. Pt reports not being able to "control" BLEs and focused on NMR and improving awareness with aiming at various colored targets with alterating LE in various directions. Back in room set up wheelchair level call bell in reach all needs met.     Therapy Documentation Precautions:  Precautions Precautions: Fall, Back Precaution Comments: Verbally reviewed precautions throughout functional mobility (pt referencing precautions on whiteboard to remember). Will  need handout. Required Braces or Orthoses: Spinal Brace Spinal Brace: Thoracolumbosacral orthotic, Applied in sitting position Restrictions Weight Bearing Restrictions: No     Therapy/Group: Individual Therapy  Viona Gilmore 08/30/2021, 7:15 AM

## 2021-08-30 NOTE — Progress Notes (Signed)
Physical Therapy Session Note  Patient Details  Name: Clifford Jenkins MRN: [id number] Date of Birth: 26-Apr-1961  Today's Date: 08/30/2021 PT Individual Time: 1000-1100 and 1430-1530 PT Individual Time Calculation (min): 60 min and 60 min  Short Term Goals: Week 1:  PT Short Term Goal 1 (Week 1): =LTG due to ELOS  Skilled Therapeutic Interventions/Progress Updates: Pt presented in w/c agreeable to therapy. Pt denies pain during session. Pt participated in functional mobility including ambulation, balance, transfers and endurance. Pt ambulated to day room with RW and supervision. Participated in obstacle course including weaving through cones, stepping over hurdles, and balance on compliant surfaces. Pt did require min cues to slow down while weaving through cones for improve BLE coordination. Pt also participated in agility ladder without AD including forward stepping over rungs, and side stepping over rungs. Pt required min/modA when performed without AD and cues for steadying self prior to taking next step.During rest breaks PTA provided education on energy conservation with activity for safety. Pt then ambulated to rehab gym with supervision and participated in use of rebounder without AD and with 2Kg weighted ball 2 x 30. Pt performed second set throwing with only single UE. Upon completion pt returned to room via RW and supervision. Pt left in w/c at end of session with call bell within reach and needs met.   Tx2: Pt presented in w/c with GF present agreeable to therapy. Pt denies pain throughout session. Session focused on integration in community environment. Pt transported to [facility] entrance for time management and energy conservation. Pt participated in ambulation outside in patio with RW and CGA fading to close supervision. Pt required intermittent cues to pace self for improve foot placement and safety but no LOB noted. Pt ambulated >548f with x 1 brief seated rest break at patio table. When  ambulating back to room pt required minA due to RW getting caught in door mat (pt ambulated over threshold without difficulty) x2. Pt with decreased safety when attempting to correct doormat requiring cues for safety/sequencing. Pt also ambulated through atrium navigating close spaces with improved safety at supervision level. Pt then transported to executive corridor and pt ambulated on carpeted surface with supervision. Upon return to unit pt propelled to day room and participated in Wii bowling at [place] close S level. Pt was able to completed all x 10 frames with close S. Pt ambulated back to room at end of session in same manner as prior and remained in w/c with call bell within reach and current needs met with GF present.      Therapy Documentation Precautions:  Precautions Precautions: Fall, Back Precaution Comments: Verbally reviewed precautions throughout functional mobility (pt referencing precautions on whiteboard to remember). Will need handout. Required Braces or Orthoses: Spinal Brace Spinal Brace: Thoracolumbosacral orthotic, Applied in sitting position Restrictions Weight Bearing Restrictions: No   Therapy/Group: Individual Therapy  Stevie Charter Torryn Hudspeth, PTA  08/30/2021, 3:54 PM

## 2021-08-30 NOTE — Progress Notes (Signed)
Inpatient Rehabilitation Discharge Medication Review by a Pharmacist  A complete drug regimen review was completed for this patient to identify any potential clinically significant medication issues.  High Risk Drug Classes Is patient taking? Indication by Medication  Antipsychotic No   Anticoagulant No   Antibiotic No   Opioid Yes Oxycodone for pain s/p back surgery.  Antiplatelet No   Hypoglycemics/insulin No   Vasoactive Medication Yes Norvasc, valsartan for HTN  Chemotherapy No   Other No      Clinically significant medication issues were identified that warrant physician communication and completion of prescribed/recommended actions by midnight of the next day:  No  Time spent performing this drug regimen review (minutes):  28min  Onnie Boer, PharmD, [place], AAHIVP, CPP Infectious Disease Pharmacist 08/30/2021 9:50 AM

## 2021-08-31 NOTE — Progress Notes (Signed)
Inpatient Rehabilitation Care Coordinator Discharge Note   Patient Details  Name: KADON ANDRUS MRN: [id number] Date of Birth: [birth year]   Discharge location: D/c to home with his signficant other  Length of Stay: 7 days  Discharge activity level: Mod I  Home/community participation: Limited  Patient response IO:XBDZHG Literacy - How often do you need to have someone help you when you read instructions, pamphlets, or other written material from your doctor or pharmacy?: Never  Patient response DJ:MEQAST Isolation - How often do you feel lonely or isolated from those around you?: Never  Services provided included: MD, RD, PT, OT, RN, TR, Pharmacy, Neuropsych, SW, CM  Financial Services:  Charity fundraiser Utilized: [employer]  Choices offered to/list presented to: yes  Follow-up services arranged:  Outpatient, DME    Outpatient Servicies: [facility] for PT/OT DME : [employer] for 3in1 BSC, RW, and w/c    Patient response to transportation need: Is the patient able to respond to transportation needs?: Yes In the past 12 months, has lack of transportation kept you from medical appointments or from getting medications?: No In the past 12 months, has lack of transportation kept you from meetings, work, or from getting things needed for daily living?: No  Comments (or additional information):  Patient/Family verbalized understanding of follow-up arrangements:  Yes  Individual responsible for coordination of the follow-up plan:    Confirmed correct DME delivered: Rana Snare 08/31/2021    Rana Snare

## 2021-08-31 NOTE — Progress Notes (Signed)
PROGRESS NOTE   Subjective/Complaints:   Pt reports no issues- GF will be here soon by 9am to take him home- asking about d/c process.  Will not need to see in f/u but will give phone number in case needs me.  Asked him to have GF to keep an eye on incision due to puffiness. Call surgeon if gets worse.   ROS:  Pt denies SOB, abd pain, CP, N/V/C/D, and vision changes   Objective:   No results found. No results for input(s): WBC, HGB, HCT, PLT in the last 72 hours.  Recent Labs    08/30/21 0528  NA 138  K 4.4  CL 102  CO2 30  GLUCOSE 112*  BUN 15  CREATININE 1.12  CALCIUM 8.8*    Intake/Output Summary (Last 24 hours) at 08/31/2021 0827 Last data filed at 08/31/2021 0700 Gross per 24 hour  Intake 1440 ml  Output --  Net 1440 ml        Physical Exam: Vital Signs Blood pressure 121/80, pulse 61, temperature 98.2 F (36.8 C), temperature source Oral, resp. rate 14, height '5\' 7"'  (1.702 m), weight 79.5 kg, SpO2 100 %.       General: awake, alert, appropriate- in bedside chair; , NAD HENT: conjugate gaze; oropharynx moist CV: regular rate; no JVD Pulmonary: CTA B/L; no W/R/R- good air movement GI: soft, NT, ND, (+)BS Psychiatric: appropriate Neurological: Ox3- wearing TLSO- sitting up  Skin: No evidence of breakdown, no evidence of rash- incision looks good- but is moderately puffy on both sides- no erythema;; no heat; steristrips in place Neurologic: Cranial nerves II through XII intact, motor strength is 5/5 in bilateral deltoid, bicep, tricep, grip, hip flexor, knee extensors, ankle dorsiflexor and plantar flexor Sensory exam normal sensation to light touch  in bilateral upper and lower extremities  Musculoskeletal: Full range of motion in all 4 extremities. No joint swelling Neuro: Alert and oriented x4 Musculoskeletal: Thoracic brace in place. Moving lower extremities with full  strength   Assessment/Plan: 1. Functional deficits which require 3+ hours per day of interdisciplinary therapy in a comprehensive inpatient rehab setting. Physiatrist is providing close team supervision and 24 hour management of active medical problems listed below. Physiatrist and rehab team continue to assess barriers to discharge/monitor patient progress toward functional and medical goals  Care Tool:  Bathing    Body parts bathed by patient: Right arm, Left arm, Chest, Abdomen, Buttocks, Front perineal area, Right upper leg, Left upper leg, Right lower leg, Left lower leg, Face         Bathing assist Assist Level: Independent with assistive device     Upper Body Dressing/Undressing Upper body dressing   What is the patient wearing?: Pull over shirt, Orthosis    Upper body assist Assist Level: Independent with assistive device    Lower Body Dressing/Undressing Lower body dressing      What is the patient wearing?: Underwear/pull up, Pants     Lower body assist Assist for lower body dressing: Independent with assitive device     Toileting Toileting Toileting Activity did not occur (Clothing management and hygiene only): N/A (no void or bm)  Toileting assist Assist  for toileting: Independent with assistive device     Transfers Chair/bed transfer  Transfers assist     Chair/bed transfer assist level: Independent with assistive device     Locomotion Ambulation   Ambulation assist      Assist level: Supervision/Verbal cueing Assistive device: Walker-rolling Max distance: 338f   Walk 10 feet activity   Assist     Assist level: Supervision/Verbal cueing Assistive device: Walker-rolling   Walk 50 feet activity   Assist    Assist level: Supervision/Verbal cueing Assistive device: Walker-rolling    Walk 150 feet activity   Assist Walk 150 feet activity did not occur: Safety/medical concerns  Assist level: Supervision/Verbal  cueing Assistive device: Walker-rolling    Walk 10 feet on uneven surface  activity   Assist Walk 10 feet on uneven surfaces activity did not occur: Safety/medical concerns   Assist level: Supervision/Verbal cueing Assistive device: Walker-rolling   Wheelchair     Assist Is the patient using a wheelchair?: Yes Type of Wheelchair: Manual    Wheelchair assist level: Set up assist Max wheelchair distance: 3045f   Wheelchair 50 feet with 2 turns activity    Assist        Assist Level: Supervision/Verbal cueing   Wheelchair 150 feet activity     Assist      Assist Level: Supervision/Verbal cueing   Blood pressure 121/80, pulse 61, temperature 98.2 F (36.8 C), temperature source Oral, resp. rate 14, height '5\' 7"'  (1.702 m), weight 79.5 kg, SpO2 100 %.      Medical Problem List and Plan: 1. Functional deficits secondary to stage IV metastatic prostate cancer/lytic and sclerotic bone lesions/cord compression/iliac lymphadenopathy.  Status post T5-6 T6-7 laminectomy transpedicular resection of thoracic spine tumor, T5-6 T6-7 posterior lateral arthrodesis, T5-7 posterior instrumentation pedicle screws and rods 08/19/2021.  Back brace when out of bed.  Patient will follow-up outpatient with Dr. ShAlen Blews well as medical oncology             -patient may shower but incision must be covered No evidence of neurogenic bowel or bladder              -ELOS/Goals: 5-10 days modi           con't CIR_ PT and OT- d/c tomorrow- will have pt f/u with surgeon- doesn't need f/u with me.   -d/c today- advised to keep an eye on incision due to puffiness- and call surgeon if gets worse.  2.  Antithrombotics: -DVT/anticoagulation:  Mechanical: Antiembolism stockings, thigh (TED hose) Bilateral lower extremities check vascular study             -antiplatelet therapy: N/A 3.Postoperative pain: continue Robaxin 500 mg twice daily, oxycodone and Flexeril as needed. Will d/c tylenol given  elevated ALT.  1/17- pain controlled- not taking much medicine- con't regimen prn 4. Situational anxiety: Lavender essential oil applied to pillow. Provide emotional support             -antipsychotic agents: N/A 5. Neuropsych: This patient is capable of making decisions on his own behalf. 6. Skin/Wound Care: Routine skin checks 7. Fluids/Electrolytes/Nutrition: Routine in and outs with follow-up chemistries  1/17- CMP looks good- except LFTs 8.  Hypertension.  Continue Norvasc 10 mg daily, Avapro 150 mg daily.  Monitor with increased mobility  1/16- BP controlled- con't regimen 9.  Constipation.  Continue colace 10073mID, also received dulc supp with 2 lg BM yesterday   1/16- will order Sorbitol 30 cc at  3pm today after therapy to help clean him out.   1/17- no results with sorbitol- will give miralax this Am and follow with 45cc Sorbitol at 3pm today- if no results, will give Soap suds enema this evening.   1/18- cleaned out- con't regimen  1/19- on miralax daily- educated pt that if gets constipated at home, suggest taking miralax 2 capfuls- since reducing pain meds, should be better 10. Overweight BMI 29.29: provide dietary education 11. Elevated ALT: d/c Tylenol  1/16- will recheck in Am to make sure trending down.   1/17- LFTS actually slightly more- 34 to 44 and 81 to 98- will recheck Thursday- also alk phos slightly elevated.   1/18- labs tomorrow  1/19- alk phose mild elevation likely due to prostate CA- ALT still 95- AST down slightly to 41 into normal range- prostate CA_ needs to f/u with PCP.  12. Leukocytosis  1/16- will recheck in AM  1/17- WBC down to 10.1k     LOS: 8 days A FACE TO FACE EVALUATION WAS PERFORMED  Ruther Ephraim 08/31/2021, 8:27 AM

## 2021-08-31 NOTE — Progress Notes (Signed)
Pt greeted in w/c. Pt reports that girlfriend will be here around 0900, equipment at bedside (3/1 BSC, wheelchair, RW). Pt reports that able to don/doff back brace himself, verbalizes understanding not to remove steri stripes and allow to fall off on their own. Informed to call once girlfriend arrives and PA will go over discharge paperwork.

## 2021-09-04 ENCOUNTER — Ambulatory Visit: Payer: BC Managed Care – PPO | Admitting: Physical Therapy

## 2021-09-04 ENCOUNTER — Ambulatory Visit: Payer: BC Managed Care – PPO | Attending: Family Medicine | Admitting: Occupational Therapy

## 2021-09-04 ENCOUNTER — Encounter: Payer: Self-pay | Admitting: Occupational Therapy

## 2021-09-04 ENCOUNTER — Other Ambulatory Visit: Payer: Self-pay

## 2021-09-04 ENCOUNTER — Encounter: Payer: Self-pay | Admitting: Physical Therapy

## 2021-09-04 DIAGNOSIS — R2681 Unsteadiness on feet: Secondary | ICD-10-CM | POA: Diagnosis not present

## 2021-09-04 DIAGNOSIS — M6281 Muscle weakness (generalized): Secondary | ICD-10-CM | POA: Insufficient documentation

## 2021-09-04 DIAGNOSIS — C61 Malignant neoplasm of prostate: Secondary | ICD-10-CM | POA: Insufficient documentation

## 2021-09-04 DIAGNOSIS — R2689 Other abnormalities of gait and mobility: Secondary | ICD-10-CM | POA: Insufficient documentation

## 2021-09-04 DIAGNOSIS — C7951 Secondary malignant neoplasm of bone: Secondary | ICD-10-CM | POA: Insufficient documentation

## 2021-09-04 DIAGNOSIS — R29818 Other symptoms and signs involving the nervous system: Secondary | ICD-10-CM | POA: Diagnosis present
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
## 2021-09-05 NOTE — Progress Notes (Signed)
°  Radiation Oncology         [phone] ________________________________  Name: Clifford Jenkins MRN: [id number]  Date: 09/11/2021  DOB: [birth year]  SIMULATION AND TREATMENT PLANNING NOTE    ICD-10-CM   1. 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  Identity was confirmed.  All relevant records and images related to the planned course of therapy were reviewed.  The patient freely provided informed written consent to proceed with treatment after reviewing the details related to the planned course of therapy. The consent form was witnessed and verified by the simulation staff.  Then, the patient was set-up in a stable reproducible  supine position for radiation therapy.  CT images were obtained.  Surface markings were placed.  The CT images were loaded into the planning software.  Then the target and avoidance structures were contoured.  Treatment planning then occurred.  The radiation prescription was entered and confirmed.  Then, I designed and supervised the construction of a total of 3 medically necessary complex treatment devices consisting of MLC apertures to shield heart and lungs.  I have requested : 3D Simulation  I have requested a DVH of the following structures: lungs, heart, spinal cord, esophagus and target volume.  PLAN:  The patient will receive 30 Gy in 10 fractions.  ________________________________  Sheral Apley Tammi Klippel, M.D.

## 2021-09-07 ENCOUNTER — Inpatient Hospital Stay: Payer: BC Managed Care – PPO | Attending: Oncology | Admitting: Oncology

## 2021-09-07 ENCOUNTER — Other Ambulatory Visit: Payer: Self-pay

## 2021-09-07 VITALS — BP 156/93 | HR 104 | Temp 98.0°F | Resp 18 | Ht 67.0 in | Wt 182.8 lb

## 2021-09-07 DIAGNOSIS — I10 Essential (primary) hypertension: Secondary | ICD-10-CM | POA: Insufficient documentation

## 2021-09-07 DIAGNOSIS — R591 Generalized enlarged lymph nodes: Secondary | ICD-10-CM | POA: Insufficient documentation

## 2021-09-07 DIAGNOSIS — C61 Malignant neoplasm of prostate: Secondary | ICD-10-CM | POA: Insufficient documentation

## 2021-09-07 DIAGNOSIS — M5106 Intervertebral disc disorders with myelopathy, lumbar region: Secondary | ICD-10-CM | POA: Diagnosis not present

## 2021-09-07 DIAGNOSIS — C7951 Secondary malignant neoplasm of bone: Secondary | ICD-10-CM | POA: Insufficient documentation

## 2021-09-07 MED ORDER — OYSTER SHELL CALCIUM/D3 500-5 MG-MCG PO TABS: 1.0000 | ORAL_TABLET | Freq: Two times a day (BID) | ORAL | 3 refills | Status: AC

## 2021-09-07 NOTE — Progress Notes (Signed)
Called patient to introduce self, no answer, no voicemail set up.    RN will attempt to contact patient again, due to not being in office at time of appointment.
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
## 2021-09-10 NOTE — Progress Notes (Signed)
GU Location of Tumor / Histology: Prostate Ca with Mets to bone  PSA is (674 as of 08/2021)  Clifford Jenkins presented  08/19/2021 with persistent back pain since 07/23/2021. Patient reports around 07/27/2021 he began having lower extremity weakness and constipation as well as bouts of urinary retention.  Biopsies:  No  Past/Anticipated interventions by urology, if any:   Past/Anticipated interventions by medical oncology, if any:  Dr. Alen Blew He is a 61 year old man hospitalized in January 2023 for lower back pain and weakness.  He was evaluated in the emergency department at [facility] and found to have multiple bony lesions with thoracic and lumbar MRI showed a T6 large intraspinal tumor and spinal cord compression.  He underwent T5-6 and T6-7 laminectomy and resection of his thoracic tumor completed by Dr. Arnoldo Morale on August 19, 2021.  At that time the final pathology showed metastatic carcinoma compatible with prostate origin.  His PSA was 674.  Imaging studies including CT scan chest abdomen and pelvis showed lytic lesion at L2, L4 as well as iliac bone destructive lesion.  Left external iliac adenopathy was also detected.  He received Mills Koller on August 21, 2021 and was discharged to rehab.   Weight changes, if any:   No  IPSS:  2 SHIM:  10  Bowel/Bladder complaints, if any:  No bowel and no bladder issues.  Nausea/Vomiting, if any:  No  Pain issues, if any:  0/10  SAFETY ISSUES: Prior radiation?   No Pacemaker/ICD?   No Possible current pregnancy?  Male Is the patient on methotrexate?   No  Current Complaints / other details: Need information on treatment options.  Hearing impaired wears hearing aid.

## 2021-09-11 ENCOUNTER — Ambulatory Visit
Admission: RE | Admit: 2021-09-11 | Discharge: 2021-09-11 | Disposition: A | Discharge status: Home / Self Care | Payer: BC Managed Care – PPO | Source: Ambulatory Visit | Attending: Radiation Oncology | Admitting: Radiation Oncology

## 2021-09-11 ENCOUNTER — Other Ambulatory Visit: Payer: Self-pay

## 2021-09-11 VITALS — BP 150/98 | HR 52 | Temp 97.0°F | Resp 18 | Ht 67.0 in | Wt 184.0 lb

## 2021-09-11 DIAGNOSIS — C7951 Secondary malignant neoplasm of bone: Secondary | ICD-10-CM | POA: Diagnosis present

## 2021-09-11 DIAGNOSIS — J45909 Unspecified asthma, uncomplicated: Secondary | ICD-10-CM | POA: Insufficient documentation

## 2021-09-11 DIAGNOSIS — Z79899 Other long term (current) drug therapy: Secondary | ICD-10-CM | POA: Diagnosis not present

## 2021-09-11 DIAGNOSIS — Z808 Family history of malignant neoplasm of other organs or systems: Secondary | ICD-10-CM | POA: Insufficient documentation

## 2021-09-11 DIAGNOSIS — K5900 Constipation, unspecified: Secondary | ICD-10-CM | POA: Insufficient documentation

## 2021-09-11 DIAGNOSIS — C61 Malignant neoplasm of prostate: Secondary | ICD-10-CM

## 2021-09-11 DIAGNOSIS — N529 Male erectile dysfunction, unspecified: Secondary | ICD-10-CM | POA: Diagnosis not present

## 2021-09-11 DIAGNOSIS — I10 Essential (primary) hypertension: Secondary | ICD-10-CM | POA: Diagnosis not present

## 2021-09-11 DIAGNOSIS — Z860100 Personal history of colon polyps, unspecified: Secondary | ICD-10-CM | POA: Insufficient documentation

## 2021-09-11 DIAGNOSIS — R599 Enlarged lymph nodes, unspecified: Secondary | ICD-10-CM | POA: Diagnosis not present

## 2021-09-11 DIAGNOSIS — M4716 Other spondylosis with myelopathy, lumbar region: Secondary | ICD-10-CM | POA: Diagnosis not present

## 2021-09-11 DIAGNOSIS — R972 Elevated prostate specific antigen [PSA]: Secondary | ICD-10-CM | POA: Insufficient documentation

## 2021-09-11 DIAGNOSIS — R911 Solitary pulmonary nodule: Secondary | ICD-10-CM | POA: Diagnosis not present

## 2021-09-11 DIAGNOSIS — H9190 Unspecified hearing loss, unspecified ear: Secondary | ICD-10-CM | POA: Insufficient documentation

## 2021-09-11 DIAGNOSIS — R222 Localized swelling, mass and lump, trunk: Secondary | ICD-10-CM | POA: Diagnosis not present

## 2021-09-11 DIAGNOSIS — M5106 Intervertebral disc disorders with myelopathy, lumbar region: Secondary | ICD-10-CM | POA: Diagnosis not present

## 2021-09-11 DIAGNOSIS — Z974 Presence of external hearing-aid: Secondary | ICD-10-CM | POA: Insufficient documentation

## 2021-09-11 DIAGNOSIS — R432 Parageusia: Secondary | ICD-10-CM | POA: Insufficient documentation

## 2021-09-11 DIAGNOSIS — R319 Hematuria, unspecified: Secondary | ICD-10-CM | POA: Insufficient documentation

## 2021-09-11 DIAGNOSIS — E669 Obesity, unspecified: Secondary | ICD-10-CM | POA: Insufficient documentation

## 2021-09-11 NOTE — Progress Notes (Signed)
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 5. 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 Electronically Signed: By: Genevie Ann M.D. On: 08/19/2021 05:48   DG C-Arm 1-60 Min-No Report  Result Date: 08/19/2021 CLINICAL DATA:  Status post T-spine laminectomy. EXAM: THORACIC SPINE 2 VIEWS; DG C-ARM 1-60 MIN-NO REPORT COMPARISON:  01/17/2022 FINDINGS: 2 images from portable C-arm radiography obtained in the operating room show changes from T5-6 and T6-7 laminectomy and T5-6 and T6-7 posterior-lateral arthrodesis. IMPRESSION: 1. Postoperative changes as above. Electronically Signed   By: Kerby Moors M.D.   On: 08/19/2021 14:16   DG C-Arm 1-60 Min-No Report  Result Date: 08/19/2021 CLINICAL DATA:  Status post T-spine laminectomy. EXAM: THORACIC SPINE 2 VIEWS; DG C-ARM 1-60 MIN-NO REPORT COMPARISON:  01/17/2022 FINDINGS: 2 images from portable C-arm radiography obtained in the operating room show changes from T5-6 and T6-7 laminectomy and T5-6 and T6-7 posterior-lateral arthrodesis. IMPRESSION: 1. Postoperative changes as above. Electronically Signed   By: Kerby Moors M.D.   On: 08/19/2021 14:16   DG C-Arm 1-60 Min-No Report  Result Date: 08/19/2021 Fluoroscopy was utilized by the requesting physician.  No radiographic interpretation.   VAS [place] LOWER EXTREMITY VENOUS  (DVT)  Result Date: 08/24/2021  Lower Venous DVT Study Patient Name:  Clifford Jenkins  Date of Exam:   08/24/2021 Medical Rec #: [id number]          Accession #:    [id number] Date of Birth: 12-28-1960  Patient Gender: M Patient Age:   18 years Exam Location:  [facility] Procedure:      VAS [place] LOWER EXTREMITY VENOUS (DVT) Referring Phys: Lauraine Rinne --------------------------------------------------------------------------------  Indications: Edema.  Comparison Study: No previous exams Performing Technologist: Jody Hill RVT, RDMS  Examination Guidelines: A complete evaluation includes B-mode imaging, spectral Doppler, color Doppler, and power Doppler as needed of all accessible portions of each vessel. Bilateral testing is considered an integral part of a complete examination. Limited examinations for reoccurring indications may be performed as noted. The reflux portion of the exam is performed with the patient in reverse Trendelenburg.  +---------+---------------+---------+-----------+----------+--------------+  RIGHT     Compressibility Phasicity Spontaneity Properties Thrombus Aging  +---------+---------------+---------+-----------+----------+--------------+  CFV       Full            Yes       Yes                                    +---------+---------------+---------+-----------+----------+--------------+  SFJ       Full                                                             +---------+---------------+---------+-----------+----------+--------------+  FV Prox   Full            Yes       Yes                                    +---------+---------------+---------+-----------+----------+--------------+  FV Mid    Full            Yes       Yes                                    +---------+---------------+---------+-----------+----------+--------------+  FV Distal Full            Yes       Yes                    rouleaux flow    +---------+---------------+---------+-----------+----------+--------------+  PFV       Full                                                             +---------+---------------+---------+-----------+----------+--------------+  POP       Full            Yes       Yes                                    +---------+---------------+---------+-----------+----------+--------------+  PTV       Full                                                             +---------+---------------+---------+-----------+----------+--------------+  PERO      Full                                                             +---------+---------------+---------+-----------+----------+--------------+   Right Technical Findings: Rouleaux flow noted in distal portion of FV  +---------+---------------+---------+-----------+----------+--------------+  LEFT      Compressibility Phasicity Spontaneity Properties Thrombus Aging  +---------+---------------+---------+-----------+----------+--------------+  CFV       Full            Yes       Yes                                    +---------+---------------+---------+-----------+----------+--------------+  SFJ       Full                                                             +---------+---------------+---------+-----------+----------+--------------+  FV Prox   Full            Yes       Yes                                    +---------+---------------+---------+-----------+----------+--------------+  FV Mid    Full            Yes       Yes                                    +---------+---------------+---------+-----------+----------+--------------+  FV Distal Full            Yes       Yes                                    +---------+---------------+---------+-----------+----------+--------------+  PFV       Full                                                             +---------+---------------+---------+-----------+----------+--------------+  POP       Full            Yes       Yes                                     +---------+---------------+---------+-----------+----------+--------------+  PTV       Full                                                             +---------+---------------+---------+-----------+----------+--------------+  PERO      Full                                                             +---------+---------------+---------+-----------+----------+--------------+     Summary: BILATERAL: - No evidence of deep vein thrombosis seen in the lower extremities, bilaterally. - No evidence of superficial venous thrombosis in the lower extremities, bilaterally. -No evidence of popliteal cyst, bilaterally.   *See table(s) above for measurements and observations. Electronically signed by Jamelle Haring on 08/24/2021 at 4:53:38 PM.    Final       IMPRESSION/PLAN: 71. 61 y.o. 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 We discussed the natural history of metastatic prostate cancer and general treatment, highlighting the role of radiotherapy in the management of painful bony lesions. We also briefly discussed the possible consideration for definitive radiotherapy to the prostate for more durable disease control pending response to treatment with ADT and/or PSMA PET imaging to determine the extent of his disease. We discussed the available radiation techniques, and focused on the details and logistics of delivery. We reviewed the anticipated acute and late sequelae associated with radiation in this setting. The patient was encouraged to ask questions that were answered to his satisfaction.  At the end of our conversation, the patient is interested in proceeding with the recommended 2 week course of daily, adjuvant radiotherapy to the surgical site at T5-T7. He has freely signed written consent to proceed today in the office and a opy of this document will be placed in his  medical record. He will proceed with CT SIM/treatment planning following our visit today, in anticipation of beginning his daily treatments in the near future. They know that they are welcome to call with any questions or concerns in the interim. We enjoyed meeting him and his wife today and look forward to continuing to participate in his care.   We personally spent 60 minutes in this encounter including chart review, reviewing radiological studies, meeting face-to-face with the patient, entering orders and completing documentation.    Nicholos Johns, PA-C    Tyler Pita, MD  [facility] Direct Dial: [phone]   Fax: [phone] [email or web address]   Skype   [email or web address]   This document serves as a record of services personally performed by Tyler Pita, MD and Freeman Caldron, PA-C. It was created on their behalf by Wilburn Mylar, a trained medical scribe. The creation of this record is based on the scribe's personal observations and the provider's statements to them. This document has been checked and approved by the attending provider.

## 2021-09-11 NOTE — Progress Notes (Signed)
I was unable to connect with patient at appointment on 1/27 with Dr. Alen Blew.  Pt here today to see Dr. Tammi Klippel and discuss radiation options.  Introduced myself to the patient as the prostate nurse navigator.  No barriers to care identified at this time.  I gave him my business card and asked him to call me with questions or concerns.  Verbalized understanding.

## 2021-09-12 ENCOUNTER — Encounter: Payer: Self-pay | Admitting: Physical Therapy

## 2021-09-12 ENCOUNTER — Ambulatory Visit: Payer: BC Managed Care – PPO | Attending: Family Medicine | Admitting: Physical Therapy

## 2021-09-12 VITALS — BP 150/93 | HR 112

## 2021-09-12 DIAGNOSIS — R29818 Other symptoms and signs involving the nervous system: Secondary | ICD-10-CM | POA: Diagnosis present

## 2021-09-12 DIAGNOSIS — M6281 Muscle weakness (generalized): Secondary | ICD-10-CM | POA: Diagnosis present

## 2021-09-12 DIAGNOSIS — R2681 Unsteadiness on feet: Secondary | ICD-10-CM | POA: Diagnosis present

## 2021-09-12 DIAGNOSIS — R2689 Other abnormalities of gait and mobility: Secondary | ICD-10-CM | POA: Diagnosis not present

## 2021-09-12 NOTE — Patient Instructions (Signed)
Access Code: [email or web address] URL: [email or web address] Date: 09/12/2021 Prepared by: [place] with Gilford Rile and Chair - 2 x daily - 5 x weekly - 1 sets - 10 reps Sit to Stand - 1 x daily - 5 x weekly - 1-2 sets - 10 reps Romberg Stance - 1-2 x daily - 5 x weekly - 3 sets - 30 hold Standing Balance with Eyes Closed - 1-2 x daily - 5 x weekly - 3 sets - 25-30 hold Side Stepping with Counter Support - 1-2 x daily - 5 x weekly - 3 sets Alternating Step Taps with Counter Support - 2 x daily - 5 x weekly - 1-2 sets - 10 reps
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
## 2021-09-14 ENCOUNTER — Ambulatory Visit: Payer: BC Managed Care – PPO

## 2021-09-14 ENCOUNTER — Other Ambulatory Visit: Payer: Self-pay

## 2021-09-14 VITALS — BP 149/92 | HR 74

## 2021-09-14 DIAGNOSIS — R2689 Other abnormalities of gait and mobility: Secondary | ICD-10-CM

## 2021-09-14 DIAGNOSIS — C61 Malignant neoplasm of prostate: Secondary | ICD-10-CM | POA: Diagnosis not present

## 2021-09-14 DIAGNOSIS — R29818 Other symptoms and signs involving the nervous system: Secondary | ICD-10-CM

## 2021-09-14 DIAGNOSIS — C7951 Secondary malignant neoplasm of bone: Secondary | ICD-10-CM | POA: Insufficient documentation

## 2021-09-14 DIAGNOSIS — R2681 Unsteadiness on feet: Secondary | ICD-10-CM

## 2021-09-14 DIAGNOSIS — M6281 Muscle weakness (generalized): Secondary | ICD-10-CM
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
## 2021-09-18 ENCOUNTER — Inpatient Hospital Stay: Payer: BC Managed Care – PPO

## 2021-09-18 ENCOUNTER — Ambulatory Visit: Payer: BC Managed Care – PPO

## 2021-09-18 ENCOUNTER — Inpatient Hospital Stay: Payer: BC Managed Care – PPO | Attending: Oncology

## 2021-09-18 ENCOUNTER — Ambulatory Visit
Admission: RE | Admit: 2021-09-18 | Discharge: 2021-09-18 | Disposition: A | Discharge status: Home / Self Care | Payer: BC Managed Care – PPO | Source: Ambulatory Visit | Attending: Radiation Oncology | Admitting: Radiation Oncology

## 2021-09-18 ENCOUNTER — Other Ambulatory Visit: Payer: Self-pay

## 2021-09-18 VITALS — BP 151/90 | HR 90

## 2021-09-18 DIAGNOSIS — R2681 Unsteadiness on feet: Secondary | ICD-10-CM

## 2021-09-18 DIAGNOSIS — R29818 Other symptoms and signs involving the nervous system: Secondary | ICD-10-CM

## 2021-09-18 DIAGNOSIS — R2689 Other abnormalities of gait and mobility: Secondary | ICD-10-CM | POA: Diagnosis not present

## 2021-09-18 DIAGNOSIS — C7951 Secondary malignant neoplasm of bone: Secondary | ICD-10-CM | POA: Diagnosis not present

## 2021-09-18 DIAGNOSIS — M6281 Muscle weakness (generalized): Secondary | ICD-10-CM
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
## 2021-09-19 ENCOUNTER — Ambulatory Visit
Admission: RE | Admit: 2021-09-19 | Discharge: 2021-09-19 | Disposition: A | Discharge status: Home / Self Care | Payer: BC Managed Care – PPO | Source: Ambulatory Visit | Attending: Radiation Oncology | Admitting: Radiation Oncology

## 2021-09-19 DIAGNOSIS — C7951 Secondary malignant neoplasm of bone: Secondary | ICD-10-CM | POA: Diagnosis not present

## 2021-09-20 ENCOUNTER — Other Ambulatory Visit: Payer: Self-pay

## 2021-09-20 ENCOUNTER — Ambulatory Visit
Admission: RE | Admit: 2021-09-20 | Discharge: 2021-09-20 | Disposition: A | Discharge status: Home / Self Care | Payer: BC Managed Care – PPO | Source: Ambulatory Visit | Attending: Radiation Oncology | Admitting: Radiation Oncology

## 2021-09-20 DIAGNOSIS — C7951 Secondary malignant neoplasm of bone: Secondary | ICD-10-CM | POA: Diagnosis not present

## 2021-09-21 ENCOUNTER — Ambulatory Visit: Payer: BC Managed Care – PPO | Admitting: Physical Therapy

## 2021-09-21 ENCOUNTER — Encounter: Payer: Self-pay | Admitting: Physical Therapy

## 2021-09-21 ENCOUNTER — Ambulatory Visit
Admission: RE | Admit: 2021-09-21 | Discharge: 2021-09-21 | Disposition: A | Discharge status: Home / Self Care | Payer: BC Managed Care – PPO | Source: Ambulatory Visit | Attending: Radiation Oncology | Admitting: Radiation Oncology

## 2021-09-21 VITALS — BP 147/89 | HR 113

## 2021-09-21 DIAGNOSIS — R2681 Unsteadiness on feet: Secondary | ICD-10-CM

## 2021-09-21 DIAGNOSIS — M6281 Muscle weakness (generalized): Secondary | ICD-10-CM

## 2021-09-21 DIAGNOSIS — C7951 Secondary malignant neoplasm of bone: Secondary | ICD-10-CM | POA: Diagnosis not present

## 2021-09-21 DIAGNOSIS — R2689 Other abnormalities of gait and mobility: Secondary | ICD-10-CM | POA: Diagnosis not present

## 2021-09-21 DIAGNOSIS — R29818 Other symptoms and signs involving the nervous system: Secondary | ICD-10-CM
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
## 2021-09-24 ENCOUNTER — Other Ambulatory Visit: Payer: Self-pay

## 2021-09-24 ENCOUNTER — Ambulatory Visit
Admission: RE | Admit: 2021-09-24 | Discharge: 2021-09-24 | Disposition: A | Discharge status: Home / Self Care | Payer: BC Managed Care – PPO | Source: Ambulatory Visit | Attending: Radiation Oncology | Admitting: Radiation Oncology

## 2021-09-24 DIAGNOSIS — C7951 Secondary malignant neoplasm of bone: Secondary | ICD-10-CM | POA: Diagnosis not present

## 2021-09-25 ENCOUNTER — Ambulatory Visit: Payer: BC Managed Care – PPO

## 2021-09-25 ENCOUNTER — Other Ambulatory Visit: Payer: Self-pay

## 2021-09-25 ENCOUNTER — Ambulatory Visit
Admission: RE | Admit: 2021-09-25 | Discharge: 2021-09-25 | Disposition: A | Discharge status: Home / Self Care | Payer: BC Managed Care – PPO | Source: Ambulatory Visit | Attending: Radiation Oncology | Admitting: Radiation Oncology

## 2021-09-25 VITALS — BP 144/95 | HR 87

## 2021-09-25 DIAGNOSIS — M6281 Muscle weakness (generalized): Secondary | ICD-10-CM

## 2021-09-25 DIAGNOSIS — C7951 Secondary malignant neoplasm of bone: Secondary | ICD-10-CM | POA: Diagnosis not present

## 2021-09-25 DIAGNOSIS — R2681 Unsteadiness on feet: Secondary | ICD-10-CM

## 2021-09-25 DIAGNOSIS — R2689 Other abnormalities of gait and mobility: Secondary | ICD-10-CM

## 2021-09-25 DIAGNOSIS — R29818 Other symptoms and signs involving the nervous system: Secondary | ICD-10-CM
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
## 2021-09-26 ENCOUNTER — Ambulatory Visit
Admission: RE | Admit: 2021-09-26 | Discharge: 2021-09-26 | Disposition: A | Discharge status: Home / Self Care | Payer: BC Managed Care – PPO | Source: Ambulatory Visit | Attending: Radiation Oncology | Admitting: Radiation Oncology

## 2021-09-26 DIAGNOSIS — C7951 Secondary malignant neoplasm of bone: Secondary | ICD-10-CM | POA: Diagnosis not present

## 2021-09-27 ENCOUNTER — Ambulatory Visit
Admission: RE | Admit: 2021-09-27 | Discharge: 2021-09-27 | Disposition: A | Discharge status: Home / Self Care | Payer: BC Managed Care – PPO | Source: Ambulatory Visit | Attending: Radiation Oncology | Admitting: Radiation Oncology

## 2021-09-27 ENCOUNTER — Other Ambulatory Visit: Payer: Self-pay | Admitting: Radiation Oncology

## 2021-09-27 ENCOUNTER — Other Ambulatory Visit: Payer: Self-pay

## 2021-09-27 DIAGNOSIS — C7951 Secondary malignant neoplasm of bone: Secondary | ICD-10-CM | POA: Diagnosis not present

## 2021-09-27 MED ORDER — SUCRALFATE 1 G PO TABS: 1.0000 g | ORAL_TABLET | Freq: Three times a day (TID) | ORAL | 2 refills | Status: DC

## 2021-09-28 ENCOUNTER — Ambulatory Visit
Admission: RE | Admit: 2021-09-28 | Discharge: 2021-09-28 | Disposition: A | Discharge status: Home / Self Care | Payer: BC Managed Care – PPO | Source: Ambulatory Visit | Attending: Radiation Oncology | Admitting: Radiation Oncology

## 2021-09-28 ENCOUNTER — Other Ambulatory Visit: Payer: Self-pay

## 2021-09-28 ENCOUNTER — Ambulatory Visit: Payer: BC Managed Care – PPO | Admitting: Physical Therapy

## 2021-09-28 ENCOUNTER — Encounter: Payer: Self-pay | Admitting: Physical Therapy

## 2021-09-28 VITALS — BP 151/93 | HR 97

## 2021-09-28 DIAGNOSIS — C7951 Secondary malignant neoplasm of bone: Secondary | ICD-10-CM | POA: Diagnosis not present

## 2021-09-28 DIAGNOSIS — R2689 Other abnormalities of gait and mobility: Secondary | ICD-10-CM | POA: Diagnosis not present

## 2021-09-28 DIAGNOSIS — M6281 Muscle weakness (generalized): Secondary | ICD-10-CM

## 2021-09-28 DIAGNOSIS — R2681 Unsteadiness on feet: Secondary | ICD-10-CM

## 2021-09-28 DIAGNOSIS — R29818 Other symptoms and signs involving the nervous system: Secondary | ICD-10-CM
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
## 2021-10-01 ENCOUNTER — Ambulatory Visit
Admission: RE | Admit: 2021-10-01 | Discharge: 2021-10-01 | Disposition: A | Discharge status: Home / Self Care | Payer: BC Managed Care – PPO | Source: Ambulatory Visit | Attending: Radiation Oncology | Admitting: Radiation Oncology

## 2021-10-01 ENCOUNTER — Other Ambulatory Visit: Payer: Self-pay

## 2021-10-01 ENCOUNTER — Encounter: Payer: Self-pay | Admitting: Urology

## 2021-10-01 DIAGNOSIS — C61 Malignant neoplasm of prostate: Secondary | ICD-10-CM

## 2021-10-01 DIAGNOSIS — C7951 Secondary malignant neoplasm of bone: Secondary | ICD-10-CM | POA: Diagnosis not present

## 2021-10-02 ENCOUNTER — Encounter: Payer: Self-pay | Admitting: Physical Therapy

## 2021-10-02 ENCOUNTER — Ambulatory Visit: Payer: BC Managed Care – PPO | Admitting: Physical Therapy

## 2021-10-02 VITALS — BP 139/86 | HR 98

## 2021-10-02 DIAGNOSIS — M6281 Muscle weakness (generalized): Secondary | ICD-10-CM

## 2021-10-02 DIAGNOSIS — R2689 Other abnormalities of gait and mobility: Secondary | ICD-10-CM

## 2021-10-02 DIAGNOSIS — R2681 Unsteadiness on feet: Secondary | ICD-10-CM
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
## 2021-10-05 ENCOUNTER — Other Ambulatory Visit: Payer: Self-pay

## 2021-10-05 ENCOUNTER — Ambulatory Visit: Payer: BC Managed Care – PPO | Admitting: Physical Therapy

## 2021-10-05 ENCOUNTER — Encounter: Payer: Self-pay | Admitting: Physical Therapy

## 2021-10-05 VITALS — BP 140/96 | HR 115

## 2021-10-05 DIAGNOSIS — R2689 Other abnormalities of gait and mobility: Secondary | ICD-10-CM | POA: Diagnosis not present

## 2021-10-05 DIAGNOSIS — R2681 Unsteadiness on feet: Secondary | ICD-10-CM

## 2021-10-05 DIAGNOSIS — M6281 Muscle weakness (generalized): Secondary | ICD-10-CM
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
## 2021-10-05 NOTE — Patient Instructions (Signed)
Access Code: [email or web address] URL: [email or web address] Date: 10/05/2021 Prepared by: [place] with Gilford Rile and Chair - 2 x daily - 5 x weekly - 1 sets - 10 reps Sit to Stand - 1 x daily - 5 x weekly - 1-2 sets - 10 reps Side Stepping with Resistance at Thighs and Counter Support - 1-2 x daily - 5 x weekly - 3 sets Standing Marching - 1-2 x daily - 5 x weekly - 3 sets Wide Stance with Eyes Closed on Foam Pad - 1 x daily - 5 x weekly - 3 sets - 15-20 hold Standing with Head Rotation - 1 x daily - 5 x weekly - 1-2 sets - 10 reps

## 2021-10-09 ENCOUNTER — Ambulatory Visit: Payer: BC Managed Care – PPO

## 2021-10-09 ENCOUNTER — Other Ambulatory Visit: Payer: Self-pay

## 2021-10-09 VITALS — BP 150/92 | HR 107

## 2021-10-09 DIAGNOSIS — R29818 Other symptoms and signs involving the nervous system: Secondary | ICD-10-CM

## 2021-10-09 DIAGNOSIS — M6281 Muscle weakness (generalized): Secondary | ICD-10-CM

## 2021-10-09 DIAGNOSIS — R2681 Unsteadiness on feet: Secondary | ICD-10-CM

## 2021-10-09 DIAGNOSIS — R2689 Other abnormalities of gait and mobility: Secondary | ICD-10-CM | POA: Diagnosis not present
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
## 2021-10-12 ENCOUNTER — Other Ambulatory Visit: Payer: Self-pay

## 2021-10-12 ENCOUNTER — Ambulatory Visit: Payer: BC Managed Care – PPO | Attending: Family Medicine | Admitting: Physical Therapy

## 2021-10-12 ENCOUNTER — Encounter: Payer: Self-pay | Admitting: Physical Therapy

## 2021-10-12 DIAGNOSIS — M6281 Muscle weakness (generalized): Secondary | ICD-10-CM | POA: Diagnosis present

## 2021-10-12 DIAGNOSIS — R2689 Other abnormalities of gait and mobility: Secondary | ICD-10-CM

## 2021-10-12 DIAGNOSIS — R2681 Unsteadiness on feet: Secondary | ICD-10-CM | POA: Diagnosis present

## 2021-10-12 DIAGNOSIS — R29818 Other symptoms and signs involving the nervous system: Secondary | ICD-10-CM | POA: Diagnosis present
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
## 2021-10-15 ENCOUNTER — Inpatient Hospital Stay: Payer: BC Managed Care – PPO | Attending: Oncology

## 2021-10-15 ENCOUNTER — Inpatient Hospital Stay: Payer: BC Managed Care – PPO

## 2021-10-15 ENCOUNTER — Other Ambulatory Visit: Payer: Self-pay

## 2021-10-15 ENCOUNTER — Inpatient Hospital Stay: Payer: BC Managed Care – PPO | Admitting: Oncology

## 2021-10-15 ENCOUNTER — Other Ambulatory Visit (HOSPITAL_COMMUNITY): Payer: Self-pay

## 2021-10-15 ENCOUNTER — Telehealth: Payer: Self-pay

## 2021-10-15 VITALS — BP 150/97 | HR 75 | Temp 97.4°F | Resp 18 | Wt 191.4 lb

## 2021-10-15 DIAGNOSIS — C7951 Secondary malignant neoplasm of bone: Secondary | ICD-10-CM

## 2021-10-15 DIAGNOSIS — C61 Malignant neoplasm of prostate: Secondary | ICD-10-CM | POA: Diagnosis not present

## 2021-10-15 DIAGNOSIS — Z5111 Encounter for antineoplastic chemotherapy: Secondary | ICD-10-CM | POA: Diagnosis not present

## 2021-10-15 LAB — CMP (CANCER CENTER ONLY)
ALT: 34 U/L (ref 0–44)
AST: 23 U/L (ref 15–41)
Albumin: 4.2 g/dL (ref 3.5–5.0)
Alkaline Phosphatase: 124 U/L (ref 38–126)
Anion gap: 7 (ref 5–15)
BUN: 17 mg/dL (ref 6–20)
CO2: 28 mmol/L (ref 22–32)
Calcium: 10.4 mg/dL — ABNORMAL HIGH (ref 8.9–10.3)
Chloride: 106 mmol/L (ref 98–111)
Creatinine: 1.1 mg/dL (ref 0.61–1.24)
GFR, Estimated: >60 mL/min (ref >60)
Glucose, Bld: 113 mg/dL — ABNORMAL HIGH (ref 70–99)
Potassium: 4.5 mmol/L (ref 3.5–5.1)
Sodium: 141 mmol/L (ref 135–145)
Total Bilirubin: 0.4 mg/dL (ref 0.3–1.2)
Total Protein: 7.6 g/dL (ref 6.5–8.1)

## 2021-10-15 LAB — CBC WITH DIFFERENTIAL (CANCER CENTER ONLY)
Abs Immature Granulocytes: 0.01 10*3/uL (ref 0.00–0.07)
Basophils Absolute: 0 10*3/uL (ref 0.0–0.1)
Basophils Relative: 0 %
Eosinophils Absolute: 0.1 10*3/uL (ref 0.0–0.5)
Eosinophils Relative: 3 %
HCT: 42.2 % (ref 39.0–52.0)
Hemoglobin: 14.4 g/dL (ref 13.0–17.0)
Immature Granulocytes: 0 %
Lymphocytes Relative: 25 %
Lymphs Abs: 1 10*3/uL (ref 0.7–4.0)
MCH: 30 pg (ref 26.0–34.0)
MCHC: 34.1 g/dL (ref 30.0–36.0)
MCV: 87.9 fL (ref 80.0–100.0)
Monocytes Absolute: 0.8 10*3/uL (ref 0.1–1.0)
Monocytes Relative: 20 %
Neutro Abs: 2.2 10*3/uL (ref 1.7–7.7)
Neutrophils Relative %: 52 %
Platelet Count: 138 10*3/uL — ABNORMAL LOW (ref 150–400)
RBC: 4.8 MIL/uL (ref 4.22–5.81)
RDW: 12.4 % (ref 11.5–15.5)
WBC Count: 4.2 10*3/uL (ref 4.0–10.5)
nRBC: 0 % (ref 0.0–0.2)

## 2021-10-15 MED ORDER — PREDNISONE 5 MG PO TABS: 5.0000 mg | ORAL_TABLET | Freq: Every day | ORAL | 1 refills | Status: DC

## 2021-10-15 MED ORDER — ABIRATERONE ACETATE 250 MG PO TABS
1000.0000 mg | ORAL_TABLET | Freq: Every day | ORAL | 0 refills | Status: DC
  Filled 2021-10-15 – 2021-10-17 (×2): qty 120, 30d supply, fill #0

## 2021-10-15 MED ORDER — LEUPROLIDE ACETATE (4 MONTH) 30 MG ~~LOC~~ KIT
30.0000 mg | PACK | Freq: Once | SUBCUTANEOUS | Status: AC
  Administered 2021-10-15: 30 mg via SUBCUTANEOUS
  Filled 2021-10-15: qty 30

## 2021-10-15 NOTE — Progress Notes (Signed)
Hematology and Oncology Follow Up Visit ? ?Clifford Jenkins ?[id number] ?08/01/1961 62 y.o. ?10/15/2021 8:23 AM ?Clifford Jenkins, PABecker, [name], PA  ? ?Principle Diagnosis: 61 year old man with castration-sensitive advanced prostate cancer with disease to the bone diagnosed in January 2023.  He was found to have a PSA of 674 and pelvic adenopathy. ? ? ?Prior Therapy:  ? ?He is status post T5-6 and T6-7 laminectomy and resection of his thoracic tumor completed by Dr. Arnoldo Morale on August 19, 2021.  At that time the final pathology showed metastatic carcinoma compatible with prostate origin. ? ?He is status post radiation therapy to the thoracic spine with total of 30 Gray in 10 fractions completed in February 2023. ? ? ?Current therapy: Androgen deprivation therapy started with Firmagon 240 mg on August 21, 2021. ? ?Interim History: [name] returns today for follow-up visit.  Since the last visit, he reports a feeling well without any major complaints.  He completed radiation therapy without any issues.  He is no longer reporting any pain in his thoracic spine.  He is ambulating with the help of a cane.  He denies any falls or syncope.  His performance status quality of life continues to improve.  He is eating well without any issues. ? ? ? ? ?Medications: I have reviewed the patient's current medications.  ?Current Outpatient Medications  ?Medication Sig Dispense Refill  ? acetaminophen (TYLENOL) 325 MG tablet Take 2 tablets (650 mg total) by mouth every 4 (four) hours as needed for mild pain ((score 1 to 3) or temp > 100.5). (Patient not taking: Reported on 09/11/2021)    ? amLODipine (NORVASC) 10 MG tablet Take 1 tablet (10 mg total) by mouth daily. 30 tablet 0  ? calcium-vitamin D (OSCAL WITH D) 500-5 MG-MCG tablet Take 1 tablet by mouth 2 (two) times daily. 60 tablet 3  ? cyclobenzaprine (FLEXERIL) 10 MG tablet Take 1 tablet (10 mg total) by mouth 3 (three) times daily as needed for muscle spasms. (Patient not  taking: Reported on 09/11/2021) 60 tablet 0  ? docusate sodium (COLACE) 100 MG capsule Take 1 capsule (100 mg total) by mouth 2 (two) times daily. (Patient not taking: Reported on 09/11/2021) 10 capsule 0  ? methocarbamol (ROBAXIN) 500 MG tablet Take 1 tablet (500 mg total) by mouth 2 (two) times daily. (Patient not taking: Reported on 09/11/2021) 30 tablet 0  ? Oxycodone HCl 10 MG TABS Take 1 tablet (10 mg total) by mouth every 4 (four) hours as needed for moderate pain. (Patient not taking: Reported on 09/11/2021) 30 tablet 0  ? polyethylene glycol (MIRALAX / GLYCOLAX) 17 g packet Take 17 g by mouth daily. (Patient not taking: Reported on 09/11/2021) 14 each 0  ? sucralfate (CARAFATE) 1 g tablet Take 1 tablet (1 g total) by mouth 4 (four) times daily -  with meals and at bedtime. 5 min before meals for radiation induced esophagitis 60 tablet 2  ? valsartan (DIOVAN) 160 MG tablet Take 1 tablet (160 mg total) by mouth daily. 30 tablet 0  ? ?No current facility-administered medications for this visit.  ? ? ? ?Allergies: No Known Allergies ? ? ? ?Physical Exam: ?Blood pressure (!) 150/97, pulse 75, temperature (!) 97.4 ?F (36.3 ?C), temperature source Tympanic, resp. rate 18, weight 191 lb 7 oz (86.8 kg), SpO2 100 %. ? ?ECOG: 1 ? ? ? ?General appearance: Comfortable appearing without any discomfort ?Head: Normocephalic without any trauma ?Oropharynx: Mucous membranes are moist and pink without  any thrush or ulcers. ?Eyes: Pupils are equal and round reactive to light. ?Lymph nodes: No cervical, supraclavicular, inguinal or axillary lymphadenopathy.   ?Heart:regular rate and rhythm.  S1 and S2 without leg edema. ?Lung: Clear without any rhonchi or wheezes.  No dullness to percussion. ?Abdomin: Soft, nontender, nondistended with good bowel sounds.  No hepatosplenomegaly. ?Musculoskeletal: No joint deformity or effusion.  Full range of motion noted. ?Neurological: No deficits noted on motor, sensory and deep tendon reflex  exam. ?Skin: No petechial rash or dryness.  Appeared moist.  ? ? ? ? ?Lab Results: ?Lab Results  ?Component Value Date  ? WBC 10.1 08/28/2021  ? HGB 11.8 (L) 08/28/2021  ? HCT 36.6 (L) 08/28/2021  ? MCV 91.7 08/28/2021  ? PLT 283 08/28/2021  ? ?  Chemistry   ?   ?Component Value Date/Time  ? NA 138 08/30/2021 0528  ? K 4.4 08/30/2021 0528  ? CL 102 08/30/2021 0528  ? CO2 30 08/30/2021 0528  ? BUN 15 08/30/2021 0528  ? CREATININE 1.12 08/30/2021 0528  ?    ?Component Value Date/Time  ? CALCIUM 8.8 (L) 08/30/2021 0528  ? ALKPHOS 175 (H) 08/30/2021 0528  ? AST 41 08/30/2021 0528  ? ALT 95 (H) 08/30/2021 0528  ? BILITOT 0.5 08/30/2021 0528  ?  ? ? ? ?Impression and Plan: ? ? ?61 year old man with: ? ?1.  Advanced prostate cancer with disease to the bone and lymphadenopathy diagnosed in January 2023.  He has castration-sensitive disease with lymphadenopathy and bone involvement. ?  ?  ?Treatment choices moving forward were discussed at this time and therapy escalation utilizing abiraterone or enzalutamide versus chemotherapy were discussed.  His options were debated at this time and after discussion today he is agreeable to proceed with abiraterone and prednisone.  Complication clinic hypertension, weight gain, edema and adrenal sufficiency as well as hyperglycemia associated with prednisone.  Alternative options such as enzalutamide were discussed complications including hypertension, fatigue and rarely seizures.  We will arrange for abiraterone and prednisone in the future. ? ?2.  Thoracic spine cord compression: He had completed surgical resection followed by radiation therapy without any recent complications. ?  ?3.  Androgen deprivation therapy: This will continue indefinitely.  He started [place] and we will switch to Eligard today and repeated in 4 months.  Complication clinic weight gain, hot flashes and others. ?  ?4.  Bone directed therapy: I recommended calcium and vitamin D supplements and obtaining dental  clearance before consideration for Xgeva.  She will see a dentist before the next visit. ? ?5.  Hypertension: Blood pressure is mildly elevated and currently under evaluation by his primary care physician.  We will continue to monitor on Zytiga. ?  ?6.  Follow-up: In 6 to 8 weeks for repeat follow-up. ?  ?  ?30  minutes were spent on this encounter.  The time was dedicated to reviewing laboratory data, disease status update, treatment choices and addressing complication related to cancer and cancer therapy. ?  ?  ? ? ?Zola Button, MD ?3/6/20238:23 AM ? ?

## 2021-10-15 NOTE — Telephone Encounter (Signed)
Oral Oncology Patient Advocate Encounter ?  ?Received notification from [place] that prior authorization for Fabio Asa is required. ?  ?PA submitted on CoverMyMeds ?Key [id number] ?Status is pending ?  ?Oral Oncology Clinic will continue to follow. ? ? ?Wynn Maudlin CPHT ?Specialty Pharmacy Patient Advocate ?[place] ?Phone [phone] ?Fax [phone] ?10/15/2021 9:26 AM ? ?

## 2021-10-15 NOTE — Patient Instructions (Signed)
Leuprolide depot injection ?What is this medication? ?LEUPROLIDE (loo PROE lide) is a man-made protein that acts like a natural hormone in the body. It decreases testosterone in men and decreases estrogen in women. In men, this medicine is used to treat advanced prostate cancer. In women, some forms of this medicine may be used to treat endometriosis, uterine fibroids, or other male hormone-related problems. ?This medicine may be used for other purposes; ask your health care provider or pharmacist if you have questions. ?COMMON BRAND NAME(S): Eligard, Fensolv, Lupron Depot, Lupron Depot-Ped, Viadur ?What should I tell my care team before I take this medication? ?They need to know if you have any of these conditions: ?diabetes ?heart disease or previous heart attack ?high blood pressure ?high cholesterol ?mental illness ?osteoporosis ?pain or difficulty passing urine ?seizures ?spinal cord metastasis ?stroke ?suicidal thoughts, plans, or attempt; a previous suicide attempt by you or a family member ?tobacco smoker ?unusual vaginal bleeding (women) ?an unusual or allergic reaction to leuprolide, benzyl alcohol, other medicines, foods, dyes, or preservatives ?pregnant or trying to get pregnant ?breast-feeding ?How should I use this medication? ?This medicine is for injection into a muscle or for injection under the skin. It is given by a health care professional in a hospital or clinic setting. The specific product will determine how it will be given to you. Make sure you understand which product you receive and how often you will receive it. ?Talk to your pediatrician regarding the use of this medicine in children. Special care may be needed. ?Overdosage: If you think you have taken too much of this medicine contact a poison control center or emergency room at once. ?NOTE: This medicine is only for you. Do not share this medicine with others. ?What if I miss a dose? ?It is important not to miss a dose. Call your  doctor or health care professional if you are unable to keep an appointment. ?Depot injections: Depot injections are given either once-monthly, every 12 weeks, every 16 weeks, or every 24 weeks depending on the product you are prescribed. The product you are prescribed will be based on if you are male or male, and your condition. Make sure you understand your product and dosing. ?What may interact with this medication? ?Do not take this medicine with any of the following medications: ?chasteberry ?cisapride ?dronedarone ?pimozide ?thioridazine ?This medicine may also interact with the following medications: ?herbal or dietary supplements, like black cohosh or DHEA ?male hormones, like estrogens or progestins and birth control pills, patches, rings, or injections ?male hormones, like testosterone ?other medicines that prolong the QT interval (abnormal heart rhythm) ?This list may not describe all possible interactions. Give your health care provider a list of all the medicines, herbs, non-prescription drugs, or dietary supplements you use. Also tell them if you smoke, drink alcohol, or use illegal drugs. Some items may interact with your medicine. ?What should I watch for while using this medication? ?Visit your doctor or health care professional for regular checks on your progress. During the first weeks of treatment, your symptoms may get worse, but then will improve as you continue your treatment. You may get hot flashes, increased bone pain, increased difficulty passing urine, or an aggravation of nerve symptoms. Discuss these effects with your doctor or health care professional, some of them may improve with continued use of this medicine. ?Male patients may experience a menstrual cycle or spotting during the first months of therapy with this medicine. If this continues, contact your doctor or   health care professional. ?This medicine may increase blood sugar. Ask your healthcare provider if changes in diet  or medicines are needed if you have diabetes. ?What side effects may I notice from receiving this medication? ?Side effects that you should report to your doctor or health care professional as soon as possible: ?allergic reactions like skin rash, itching or hives, swelling of the face, lips, or tongue ?breathing problems ?chest pain ?depression or memory disorders ?pain in your legs or groin ?pain at site where injected or implanted ?seizures ?severe headache ?signs and symptoms of high blood sugar such as being more thirsty or hungry or having to urinate more than normal. You may also feel very tired or have blurry vision ?swelling of the feet and legs ?suicidal thoughts or other mood changes ?visual changes ?vomiting ?Side effects that usually do not require medical attention (report to your doctor or health care professional if they continue or are bothersome): ?breast swelling or tenderness ?decrease in sex drive or performance ?diarrhea ?hot flashes ?loss of appetite ?muscle, joint, or bone pains ?nausea ?redness or irritation at site where injected or implanted ?skin problems or acne ?This list may not describe all possible side effects. Call your doctor for medical advice about side effects. You may report side effects to FDA at 1-800-FDA-1088. ?Where should I keep my medication? ?This drug is given in a hospital or clinic and will not be stored at home. ?NOTE: This sheet is a summary. It may not cover all possible information. If you have questions about this medicine, talk to your doctor, pharmacist, or health care provider. ?? 2022 Elsevier/Gold Standard (2021-04-17 00:00:00) ? ?

## 2021-10-15 NOTE — Telephone Encounter (Signed)
Oral Oncology Patient Advocate Encounter ? ?Prior Authorization for Fabio Asa has been approved.   ? ?PA# [id number] ?Effective dates: 10/15/21 through 10/16/22 ? ?Patients co-pay is $5 ? ?Oral Oncology Clinic will continue to follow.  ? ?Wynn Maudlin CPHT ?Specialty Pharmacy Patient Advocate ?[place] ?Phone [phone] ?Fax [phone] ?10/15/2021 1:28 PM ? ?

## 2021-10-16 ENCOUNTER — Ambulatory Visit: Payer: BC Managed Care – PPO

## 2021-10-16 ENCOUNTER — Telehealth: Payer: Self-pay | Admitting: *Deleted

## 2021-10-16 VITALS — BP 148/88

## 2021-10-16 DIAGNOSIS — R2689 Other abnormalities of gait and mobility: Secondary | ICD-10-CM

## 2021-10-16 DIAGNOSIS — M6281 Muscle weakness (generalized): Secondary | ICD-10-CM

## 2021-10-16 DIAGNOSIS — R2681 Unsteadiness on feet: Secondary | ICD-10-CM

## 2021-10-16 LAB — PROSTATE-SPECIFIC AG, SERUM (LABCORP): Prostate Specific Ag, Serum: 38.3 ng/mL — ABNORMAL HIGH (ref 0.0–4.0)
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
## 2021-10-16 NOTE — Telephone Encounter (Signed)
Per Dr.Shadad, called pt with message below. Pt verbalized understanding.  ?

## 2021-10-16 NOTE — Telephone Encounter (Signed)
Oral Oncology Pharmacist Encounter ? ?Received new prescription for abiraterone (Zytiga) for the treatment of castration-sensitive prostate cancer in conjunction with prednisone, planned duration until disease progression or unacceptable toxicity. Prescription dose and frequency assessed. ? ?Labs from 10/15/21 assessed, no interventions needed. ? ?Current medication list in Epic reviewed, DDIs with Zytiga identified: none ? ?Evaluated chart and no patient barriers to medication adherence noted.  ? ?Patient agreement for treatment documented in MD note on 10/15/2021. ? ?Prescription has been e-scribed to the [facility] for benefits analysis and approval. ? ?Oral Oncology Clinic will continue to follow for insurance authorization, copayment issues, initial counseling and start date. ? ?Drema Halon, PharmD ?Hematology/Oncology Clinical Pharmacist ?[facility] ?[phone] ?10/16/2021 8:35 AM ? ? ?

## 2021-10-16 NOTE — Telephone Encounter (Signed)
-----   Message from Wyatt Portela, MD sent at 10/16/2021  8:42 AM EST ----- ?Please let him know his PSA is down ?

## 2021-10-17 ENCOUNTER — Other Ambulatory Visit (HOSPITAL_COMMUNITY): Payer: Self-pay

## 2021-10-17 NOTE — Telephone Encounter (Signed)
Oral Chemotherapy Pharmacist Encounter ? ?I spoke with patient for overview of: Zytiga for the treatment of castration-sensitive prostate cancer in conjunction with prednisone, planned duration until disease progression or unacceptable toxicity.  ? ?Counseled patient on administration, dosing, side effects, monitoring, drug-food interactions, safe handling, storage, and disposal. ? ?Patient will take Zytiga '250mg'$  tablets, 4 tablets ('1000mg'$ ) by mouth once daily on an empty stomach, 1 hour before or 2 hours after a meal. ? ?Patient states he will take his Zytiga in the morning and will wait at least 1 hour before eating. ? ?Patient will take prednisone '5mg'$  tablet, 1 tablet by mouth one daily with breakfast. ? ?Zytiga start date: 10/18/2021 ? ?Adverse effects include but are not limited to: peripheral edema, GI upset, hypertension, hot flashes, fatigue, and arthralgias.   ? ?Prednisone prescription has been sent to [facility] on 10/15/21. ?Patient will obtain prednisone and knows to start prednisone on the same day as Zytiga start. ? ?Reviewed with patient importance of keeping a medication schedule and plan for any missed doses. No barriers to medication adherence identified. ? ?Medication reconciliation performed and medication/allergy list updated. ? ?Ship broker for Clifford Jenkins has been obtained. ?Test claim at the pharmacy revealed copayment $5 for 1st fill of 30 days. ?Patient will pick up medication today, 10/17/21.  ? ?Patient informed the pharmacy will reach out 5-7 days prior to needing next fill of Zytiga to coordinate continued medication acquisition to prevent break in therapy. ? ?All questions answered. ? ?Clifford Jenkins voiced understanding and appreciation.  ? ?Medication education handout placed in mail for patient. Patient knows to call the office with questions or concerns. Oral Chemotherapy Clinic phone number provided to patient.  ? ?Drema Halon, PharmD ?Hematology/Oncology Clinical  Pharmacist ?[facility] ?[phone] ?10/17/2021 12:54 PM ? ? ?

## 2021-10-17 NOTE — Telephone Encounter (Signed)
Oral Oncology Pharmacist Encounter ? ?Attempted to call patient to discuss new medication, Zytiga. Patients voicemail is not set up as of now so unable to leave a message.  ? ?Oral oncology clinic will continue to follow. ? ?Drema Halon, PharmD ?Hematology/Oncology Clinical Pharmacist ?[facility] ?[phone] ? ?

## 2021-10-19 ENCOUNTER — Other Ambulatory Visit: Payer: Self-pay

## 2021-10-19 ENCOUNTER — Ambulatory Visit: Payer: BC Managed Care – PPO | Admitting: Physical Therapy

## 2021-10-19 ENCOUNTER — Encounter: Payer: Self-pay | Admitting: Physical Therapy

## 2021-10-19 VITALS — BP 136/86 | HR 94

## 2021-10-19 DIAGNOSIS — R2689 Other abnormalities of gait and mobility: Secondary | ICD-10-CM | POA: Diagnosis not present

## 2021-10-19 DIAGNOSIS — R2681 Unsteadiness on feet: Secondary | ICD-10-CM

## 2021-10-19 DIAGNOSIS — M6281 Muscle weakness (generalized): Secondary | ICD-10-CM

## 2021-10-19 DIAGNOSIS — R29818 Other symptoms and signs involving the nervous system: Secondary | ICD-10-CM
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
## 2021-10-23 ENCOUNTER — Other Ambulatory Visit: Payer: Self-pay

## 2021-10-23 ENCOUNTER — Ambulatory Visit: Payer: BC Managed Care – PPO

## 2021-10-23 DIAGNOSIS — R2689 Other abnormalities of gait and mobility: Secondary | ICD-10-CM | POA: Diagnosis not present

## 2021-10-23 DIAGNOSIS — R2681 Unsteadiness on feet: Secondary | ICD-10-CM

## 2021-10-23 DIAGNOSIS — M6281 Muscle weakness (generalized): Secondary | ICD-10-CM
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
## 2021-10-26 ENCOUNTER — Encounter: Payer: Self-pay | Admitting: Physical Therapy

## 2021-10-26 ENCOUNTER — Other Ambulatory Visit: Payer: Self-pay

## 2021-10-26 ENCOUNTER — Ambulatory Visit: Payer: BC Managed Care – PPO | Admitting: Physical Therapy

## 2021-10-26 DIAGNOSIS — R2681 Unsteadiness on feet: Secondary | ICD-10-CM

## 2021-10-26 DIAGNOSIS — R2689 Other abnormalities of gait and mobility: Secondary | ICD-10-CM | POA: Diagnosis not present

## 2021-10-26 DIAGNOSIS — M6281 Muscle weakness (generalized): Secondary | ICD-10-CM
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
## 2021-10-30 ENCOUNTER — Other Ambulatory Visit: Payer: Self-pay

## 2021-10-30 ENCOUNTER — Other Ambulatory Visit (HOSPITAL_COMMUNITY): Payer: Self-pay

## 2021-10-30 ENCOUNTER — Ambulatory Visit: Payer: BC Managed Care – PPO

## 2021-10-30 ENCOUNTER — Encounter: Payer: Self-pay | Admitting: Urology

## 2021-10-30 DIAGNOSIS — R2689 Other abnormalities of gait and mobility: Secondary | ICD-10-CM

## 2021-10-30 DIAGNOSIS — R2681 Unsteadiness on feet: Secondary | ICD-10-CM

## 2021-10-30 DIAGNOSIS — M6281 Muscle weakness (generalized): Secondary | ICD-10-CM
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
## 2021-10-30 NOTE — Progress Notes (Signed)
Telephone follow up appointment. Verified patient identity and began nursing interview. Patient is doing well. Reports polyuria/ nocturia x4. No other issues reported to me at this time. ? ?Meaningful use complete. ?I-PSS score of 4 (mild). ?No urinary management medications at this time. ?Urology F/U appointment- None per [facility].  ? ?Reminded patient of his 11:00am telephone appointment w/ Ashlyn Bruning PA-C. I left my extension [phone] in case patient needs anything. Patient verbalized understanding of information. ? ?Patient contact [phone] (Sig-Other Allegra Lai) ?

## 2021-10-31 ENCOUNTER — Ambulatory Visit
Admission: RE | Admit: 2021-10-31 | Discharge: 2021-10-31 | Disposition: A | Discharge status: Home / Self Care | Payer: BC Managed Care – PPO | Source: Ambulatory Visit | Attending: Urology | Admitting: Urology

## 2021-10-31 ENCOUNTER — Telehealth: Payer: Self-pay | Admitting: Urology

## 2021-10-31 DIAGNOSIS — C61 Malignant neoplasm of prostate: Secondary | ICD-10-CM

## 2021-10-31 NOTE — Telephone Encounter (Signed)
I attempted to call the patient to conduct his routine schedule I month follow-up visit status post completion of postoperative palliative radiotherapy to the thoracic spine.  He was unavailable so I did leave a detailed message on his voicemail advising him to return my call if he has any questions or concerns related to the radiotherapy but otherwise, we had a good report from the nursing staff which sounds like he tolerated treatment well and has recovered appropriately.  Therefore, if he does not have any concerns at that time, we will just continue in close communication with Dr. Alen Blew to continue monitoring his progress going forward since he will be seeing him regularly to continue management of his systemic disease with the ADT and Zytiga.  He was advised that he is welcome to call at anytime with any questions or concerns related to his previous radiation. ? ?Nicholos Johns, MMS, PA-C ?[facility] ?Radiation Oncology Physician Assistant ?Direct Dial: [phone]  Fax: [phone] ? ?

## 2021-11-01 ENCOUNTER — Other Ambulatory Visit (HOSPITAL_COMMUNITY): Payer: Self-pay

## 2021-11-02 ENCOUNTER — Other Ambulatory Visit: Payer: Self-pay

## 2021-11-02 ENCOUNTER — Ambulatory Visit: Payer: BC Managed Care – PPO

## 2021-11-02 VITALS — BP 131/91 | HR 92

## 2021-11-02 DIAGNOSIS — M6281 Muscle weakness (generalized): Secondary | ICD-10-CM

## 2021-11-02 DIAGNOSIS — R2689 Other abnormalities of gait and mobility: Secondary | ICD-10-CM | POA: Diagnosis not present

## 2021-11-02 DIAGNOSIS — R2681 Unsteadiness on feet: Secondary | ICD-10-CM

## 2021-11-02 DIAGNOSIS — R29818 Other symptoms and signs involving the nervous system: Secondary | ICD-10-CM
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
## 2021-11-06 ENCOUNTER — Encounter: Payer: Self-pay | Admitting: Physical Therapy

## 2021-11-06 ENCOUNTER — Other Ambulatory Visit: Payer: Self-pay

## 2021-11-06 ENCOUNTER — Ambulatory Visit: Payer: BC Managed Care – PPO | Admitting: Physical Therapy

## 2021-11-06 ENCOUNTER — Other Ambulatory Visit (HOSPITAL_COMMUNITY): Payer: Self-pay

## 2021-11-06 DIAGNOSIS — R2689 Other abnormalities of gait and mobility: Secondary | ICD-10-CM

## 2021-11-06 DIAGNOSIS — M6281 Muscle weakness (generalized): Secondary | ICD-10-CM

## 2021-11-06 DIAGNOSIS — R29818 Other symptoms and signs involving the nervous system: Secondary | ICD-10-CM

## 2021-11-06 DIAGNOSIS — R2681 Unsteadiness on feet: Secondary | ICD-10-CM
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
## 2021-11-08 ENCOUNTER — Other Ambulatory Visit (HOSPITAL_COMMUNITY): Payer: Self-pay

## 2021-11-08 ENCOUNTER — Other Ambulatory Visit: Payer: Self-pay | Admitting: Oncology

## 2021-11-08 MED ORDER — ABIRATERONE ACETATE 250 MG PO TABS
1000.0000 mg | ORAL_TABLET | Freq: Every day | ORAL | 0 refills | Status: DC
  Filled 2021-11-08: qty 120, 30d supply, fill #0

## 2021-11-09 ENCOUNTER — Ambulatory Visit: Payer: BC Managed Care – PPO

## 2021-11-09 DIAGNOSIS — R2689 Other abnormalities of gait and mobility: Secondary | ICD-10-CM | POA: Diagnosis not present

## 2021-11-09 DIAGNOSIS — M6281 Muscle weakness (generalized): Secondary | ICD-10-CM

## 2021-11-09 DIAGNOSIS — R2681 Unsteadiness on feet: Secondary | ICD-10-CM
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
## 2021-11-13 ENCOUNTER — Ambulatory Visit: Payer: BC Managed Care – PPO | Attending: Family Medicine | Admitting: Physical Therapy

## 2021-11-13 ENCOUNTER — Encounter: Payer: Self-pay | Admitting: Physical Therapy

## 2021-11-13 DIAGNOSIS — R2681 Unsteadiness on feet: Secondary | ICD-10-CM | POA: Diagnosis present

## 2021-11-13 DIAGNOSIS — R29818 Other symptoms and signs involving the nervous system: Secondary | ICD-10-CM | POA: Insufficient documentation

## 2021-11-13 DIAGNOSIS — R2689 Other abnormalities of gait and mobility: Secondary | ICD-10-CM | POA: Insufficient documentation

## 2021-11-13 DIAGNOSIS — M6281 Muscle weakness (generalized): Secondary | ICD-10-CM | POA: Insufficient documentation
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
## 2021-11-14 ENCOUNTER — Other Ambulatory Visit (HOSPITAL_COMMUNITY): Payer: Self-pay

## 2021-11-16 ENCOUNTER — Ambulatory Visit: Payer: BC Managed Care – PPO

## 2021-11-16 DIAGNOSIS — R2689 Other abnormalities of gait and mobility: Secondary | ICD-10-CM

## 2021-11-16 DIAGNOSIS — M6281 Muscle weakness (generalized): Secondary | ICD-10-CM

## 2021-11-16 DIAGNOSIS — R2681 Unsteadiness on feet: Secondary | ICD-10-CM
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
## 2021-11-20 ENCOUNTER — Encounter: Payer: Self-pay | Admitting: Physical Therapy

## 2021-11-20 ENCOUNTER — Ambulatory Visit: Payer: BC Managed Care – PPO | Admitting: Physical Therapy

## 2021-11-20 DIAGNOSIS — R2689 Other abnormalities of gait and mobility: Secondary | ICD-10-CM

## 2021-11-20 DIAGNOSIS — R29818 Other symptoms and signs involving the nervous system: Secondary | ICD-10-CM

## 2021-11-20 DIAGNOSIS — R2681 Unsteadiness on feet: Secondary | ICD-10-CM

## 2021-11-20 DIAGNOSIS — M6281 Muscle weakness (generalized): Secondary | ICD-10-CM
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
## 2021-11-21 ENCOUNTER — Telehealth: Payer: Self-pay | Admitting: Oncology

## 2021-11-21 NOTE — Telephone Encounter (Signed)
Scheduled per 03/06 los, patient has been called and notified. ?

## 2021-11-23 ENCOUNTER — Ambulatory Visit: Payer: BC Managed Care – PPO

## 2021-11-23 DIAGNOSIS — R29818 Other symptoms and signs involving the nervous system: Secondary | ICD-10-CM

## 2021-11-23 DIAGNOSIS — R2689 Other abnormalities of gait and mobility: Secondary | ICD-10-CM

## 2021-11-23 DIAGNOSIS — M6281 Muscle weakness (generalized): Secondary | ICD-10-CM

## 2021-11-23 DIAGNOSIS — R2681 Unsteadiness on feet: Secondary | ICD-10-CM
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
## 2021-11-27 ENCOUNTER — Inpatient Hospital Stay: Payer: BC Managed Care – PPO | Admitting: Oncology

## 2021-11-27 ENCOUNTER — Inpatient Hospital Stay: Payer: BC Managed Care – PPO | Attending: Oncology

## 2021-11-27 ENCOUNTER — Other Ambulatory Visit: Payer: Self-pay

## 2021-11-27 VITALS — BP 141/82 | HR 79 | Temp 97.9°F | Resp 17 | Ht 67.0 in | Wt 192.8 lb

## 2021-11-27 DIAGNOSIS — C61 Malignant neoplasm of prostate: Secondary | ICD-10-CM | POA: Insufficient documentation

## 2021-11-27 DIAGNOSIS — I10 Essential (primary) hypertension: Secondary | ICD-10-CM | POA: Diagnosis not present

## 2021-11-27 DIAGNOSIS — C7951 Secondary malignant neoplasm of bone: Secondary | ICD-10-CM

## 2021-11-27 DIAGNOSIS — G9520 Unspecified cord compression: Secondary | ICD-10-CM | POA: Insufficient documentation

## 2021-11-27 LAB — CBC WITH DIFFERENTIAL (CANCER CENTER ONLY)
Abs Immature Granulocytes: 0.01 10*3/uL (ref 0.00–0.07)
Basophils Absolute: 0 10*3/uL (ref 0.0–0.1)
Basophils Relative: 0 %
Eosinophils Absolute: 0.1 10*3/uL (ref 0.0–0.5)
Eosinophils Relative: 3 %
HCT: 39.1 % (ref 39.0–52.0)
Hemoglobin: 13.8 g/dL (ref 13.0–17.0)
Immature Granulocytes: 0 %
Lymphocytes Relative: 31 %
Lymphs Abs: 1.4 10*3/uL (ref 0.7–4.0)
MCH: 30.7 pg (ref 26.0–34.0)
MCHC: 35.3 g/dL (ref 30.0–36.0)
MCV: 87.1 fL (ref 80.0–100.0)
Monocytes Absolute: 0.6 10*3/uL (ref 0.1–1.0)
Monocytes Relative: 13 %
Neutro Abs: 2.4 10*3/uL (ref 1.7–7.7)
Neutrophils Relative %: 52 %
Platelet Count: UNDETERMINED 10*3/uL (ref 150–400)
RBC: 4.49 MIL/uL (ref 4.22–5.81)
RDW: 12.4 % (ref 11.5–15.5)
WBC Count: 4.6 10*3/uL (ref 4.0–10.5)
nRBC: 0 % (ref 0.0–0.2)

## 2021-11-27 LAB — CMP (CANCER CENTER ONLY)
ALT: 23 U/L (ref 0–44)
AST: 22 U/L (ref 15–41)
Albumin: 4.1 g/dL (ref 3.5–5.0)
Alkaline Phosphatase: 102 U/L (ref 38–126)
Anion gap: 7 (ref 5–15)
BUN: 12 mg/dL (ref 6–20)
CO2: 27 mmol/L (ref 22–32)
Calcium: 9.8 mg/dL (ref 8.9–10.3)
Chloride: 113 mmol/L — ABNORMAL HIGH (ref 98–111)
Creatinine: 1.07 mg/dL (ref 0.61–1.24)
GFR, Estimated: >60 mL/min (ref >60)
Glucose, Bld: 133 mg/dL — ABNORMAL HIGH (ref 70–99)
Potassium: 3.9 mmol/L (ref 3.5–5.1)
Sodium: 147 mmol/L — ABNORMAL HIGH (ref 135–145)
Total Bilirubin: 0.5 mg/dL (ref 0.3–1.2)
Total Protein: 7.2 g/dL (ref 6.5–8.1)

## 2021-11-27 NOTE — Progress Notes (Signed)
Hematology and Oncology Follow Up Visit ? ?Clifford Jenkins ?[id number] ?Jun 20, 1961 61 y.o. ?11/27/2021 3:01 PM ?Clifford Jenkins, PABecker, Clifford Bale, PA  ? ?Principle Diagnosis: 61 year old man with prostate cancer diagnosed in January 2023.  He presented with castration-sensitive disease to the bone, PSA of 674 and pelvic adenopathy. ? ? ?Prior Therapy:  ? ?He is status post T5-6 and T6-7 laminectomy and resection of his thoracic tumor completed by Dr. Arnoldo Morale on August 19, 2021.  At that time the final pathology showed metastatic carcinoma compatible with prostate origin. ? ?He is status post radiation therapy to the thoracic spine with total of 30 Gray in 10 fractions completed in February 2023. ? ?Androgen deprivation therapy started with Firmagon 240 mg on August 21, 2021. ? ? ?Current therapy:  ? ?Eligard 30 mg every 4 months started on October 15, 2021. ? ?Zytiga 1000 mg with prednisone 5 mg daily started on October 18, 2021. ? ?Interim History: Clifford Jenkins is here for a follow-up evaluation.  Since the last visit, he has started [facility] and has tolerated it very well.  He denies any nausea, vomiting or abdominal pain.  He denies any excessive fatigue or tiredness.  He denies any hospitalizations or illnesses.  He is participating in physical therapy without any issues. ? ? ? ? ?Medications: Updated on review. ?Current Outpatient Medications  ?Medication Sig Dispense Refill  ? abiraterone acetate (ZYTIGA) 250 MG tablet Take 4 tablets (1,000 mg total) by mouth daily. Take on an empty stomach 1 hour before or 2 hours after a meal 120 tablet 0  ? acetaminophen (TYLENOL) 325 MG tablet Take 2 tablets (650 mg total) by mouth every 4 (four) hours as needed for mild pain ((score 1 to 3) or temp > 100.5). (Patient not taking: Reported on 09/11/2021)    ? amLODipine (NORVASC) 10 MG tablet Take 1 tablet (10 mg total) by mouth daily. 30 tablet 0  ? calcium-vitamin D (OSCAL WITH D) 500-5 MG-MCG tablet Take 1 tablet by mouth 2 (two)  times daily. 60 tablet 3  ? cyclobenzaprine (FLEXERIL) 10 MG tablet Take 1 tablet (10 mg total) by mouth 3 (three) times daily as needed for muscle spasms. (Patient not taking: Reported on 09/11/2021) 60 tablet 0  ? docusate sodium (COLACE) 100 MG capsule Take 1 capsule (100 mg total) by mouth 2 (two) times daily. (Patient not taking: Reported on 09/11/2021) 10 capsule 0  ? methocarbamol (ROBAXIN) 500 MG tablet Take 1 tablet (500 mg total) by mouth 2 (two) times daily. (Patient not taking: Reported on 09/11/2021) 30 tablet 0  ? Oxycodone HCl 10 MG TABS Take 1 tablet (10 mg total) by mouth every 4 (four) hours as needed for moderate pain. (Patient not taking: Reported on 09/11/2021) 30 tablet 0  ? polyethylene glycol (MIRALAX / GLYCOLAX) 17 g packet Take 17 g by mouth daily. (Patient not taking: Reported on 09/11/2021) 14 each 0  ? predniSONE (DELTASONE) 5 MG tablet Take 1 tablet (5 mg total) by mouth daily with breakfast. 90 tablet 1  ? sucralfate (CARAFATE) 1 g tablet Take 1 tablet (1 g total) by mouth 4 (four) times daily -  with meals and at bedtime. 5 min before meals for radiation induced esophagitis 60 tablet 2  ? valsartan (DIOVAN) 160 MG tablet Take 1 tablet (160 mg total) by mouth daily. 30 tablet 0  ? ?No current facility-administered medications for this visit.  ? ? ? ?Allergies: No Known Allergies ? ? ? ?Physical Exam: ?  Blood pressure (!) 141/82, pulse 79, temperature 97.9 ?F (36.6 ?C), temperature source Temporal, resp. rate 17, height '5\' 7"'$  (1.702 m), weight 192 lb 12.8 oz (87.5 kg), SpO2 100 %. ? ? ?ECOG: 1 ? ? ? ?General appearance: Alert, awake without any distress. ?Head: Atraumatic without abnormalities ?Oropharynx: Without any thrush or ulcers. ?Eyes: No scleral icterus. ?Lymph nodes: No lymphadenopathy noted in the cervical, supraclavicular, or axillary nodes ?Heart:regular rate and rhythm, without any murmurs or gallops.   ?Lung: Clear to auscultation without any rhonchi, wheezes or dullness to  percussion. ?Abdomin: Soft, nontender without any shifting dullness or ascites. ?Musculoskeletal: No clubbing or cyanosis. ?Neurological: No motor or sensory deficits. ?Skin: No rashes or lesions. ? ? ? ? ? ? ?Lab Results: ?Lab Results  ?Component Value Date  ? WBC 4.2 10/15/2021  ? HGB 14.4 10/15/2021  ? HCT 42.2 10/15/2021  ? MCV 87.9 10/15/2021  ? PLT 138 (L) 10/15/2021  ? ?  Chemistry   ?   ?Component Value Date/Time  ? NA 141 10/15/2021 0816  ? K 4.5 10/15/2021 0816  ? CL 106 10/15/2021 0816  ? CO2 28 10/15/2021 0816  ? BUN 17 10/15/2021 0816  ? CREATININE 1.10 10/15/2021 0816  ?    ?Component Value Date/Time  ? CALCIUM 10.4 (H) 10/15/2021 0816  ? ALKPHOS 124 10/15/2021 0816  ? AST 23 10/15/2021 0816  ? ALT 34 10/15/2021 0816  ? BILITOT 0.4 10/15/2021 0816  ?  ? ? Latest Reference Range & Units 08/20/21 14:25 10/15/21 08:16  ?Prostate Specific Ag, Serum 0.0 - 4.0 ng/mL  38.3 (H)  ?Prostatic Specific Antigen 0.00 - 4.00 ng/mL 674.00 (H)   ?(H): Data is abnormally high ? ?Impression and Plan: ? ? ?61 year old man with: ? ?1.  Castration-sensitive advanced prostate cancer with disease to the bone and lymphadenopathy diagnosed in January 2023.    ? ? ?He is currently on Zytiga without any major complications.  His PSA has dropped expectantly from the 600 range down to 38 in March 2023.  Risks and benefits of continuing Zytiga were reviewed at this time.  Complications that include hypertension, edema among others were reiterated.  Alternative treatment options including enzalutamide or Taxotere chemotherapy which will be referred unless he developed castration- resistant disease.  ? ?2.  Thoracic spine cord compression: No additional complications noted at this time after surgery and radiation. ?  ?3.  Androgen deprivation therapy: He received Eligard in March 2023 and will be repeated in 4 months. ?  ?4.  Bone directed therapy: Risks and benefits of starting Xgeva were discussed at this time.  These include  hypocalcemia and osteonecrosis of the jaw.  Recommended calcium and vitamin D supplements and obtaining dental clearance before commencing.  His dental clearance will be completed this week and will plan for his Xgeva with the next visit in 6 weeks. ? ?5.  Hypertension: His blood pressure is mildly elevated but overall under reasonable control.  We will continue to monitor on Zytiga. ?  ?6.  Follow-up: In 6 weeks for repeat follow-up. ?  ?  ?30  minutes were spent on this visit.  The time was dedicated to reviewing laboratory data, disease status update and outlining future plan of care discussion. ?  ?  ? ? ?Zola Button, MD ?4/18/20233:01 PM ? ?

## 2021-11-28 ENCOUNTER — Ambulatory Visit: Payer: BC Managed Care – PPO

## 2021-11-28 ENCOUNTER — Telehealth: Payer: Self-pay | Admitting: *Deleted

## 2021-11-28 DIAGNOSIS — R2689 Other abnormalities of gait and mobility: Secondary | ICD-10-CM | POA: Diagnosis not present

## 2021-11-28 DIAGNOSIS — M6281 Muscle weakness (generalized): Secondary | ICD-10-CM

## 2021-11-28 DIAGNOSIS — R29818 Other symptoms and signs involving the nervous system: Secondary | ICD-10-CM

## 2021-11-28 DIAGNOSIS — R2681 Unsteadiness on feet: Secondary | ICD-10-CM

## 2021-11-28 LAB — PROSTATE-SPECIFIC AG, SERUM (LABCORP): Prostate Specific Ag, Serum: 0.3 ng/mL (ref 0.0–4.0)

## 2021-11-28 NOTE — Telephone Encounter (Signed)
-----   Message from Wyatt Portela, MD sent at 11/28/2021  8:52 AM EDT ----- ?Please let him know his PSA is down ?

## 2021-11-28 NOTE — Telephone Encounter (Signed)
PC to patient, informed him his PSA is 0.3, he verbalizes understanding. 
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
## 2021-11-30 ENCOUNTER — Ambulatory Visit: Payer: BC Managed Care – PPO

## 2021-11-30 VITALS — BP 135/90 | HR 79

## 2021-11-30 DIAGNOSIS — M6281 Muscle weakness (generalized): Secondary | ICD-10-CM

## 2021-11-30 DIAGNOSIS — R2681 Unsteadiness on feet: Secondary | ICD-10-CM

## 2021-11-30 DIAGNOSIS — R2689 Other abnormalities of gait and mobility: Secondary | ICD-10-CM

## 2021-11-30 DIAGNOSIS — R29818 Other symptoms and signs involving the nervous system: Secondary | ICD-10-CM
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
## 2021-12-04 ENCOUNTER — Ambulatory Visit: Payer: BC Managed Care – PPO | Admitting: Physical Therapy

## 2021-12-04 ENCOUNTER — Other Ambulatory Visit: Payer: Self-pay | Admitting: Oncology

## 2021-12-04 ENCOUNTER — Other Ambulatory Visit (HOSPITAL_COMMUNITY): Payer: Self-pay

## 2021-12-04 ENCOUNTER — Encounter: Payer: Self-pay | Admitting: Physical Therapy

## 2021-12-04 DIAGNOSIS — R2681 Unsteadiness on feet: Secondary | ICD-10-CM

## 2021-12-04 DIAGNOSIS — M6281 Muscle weakness (generalized): Secondary | ICD-10-CM

## 2021-12-04 DIAGNOSIS — R29818 Other symptoms and signs involving the nervous system: Secondary | ICD-10-CM

## 2021-12-04 DIAGNOSIS — R2689 Other abnormalities of gait and mobility: Secondary | ICD-10-CM | POA: Diagnosis not present

## 2021-12-04 MED ORDER — ABIRATERONE ACETATE 250 MG PO TABS
1000.0000 mg | ORAL_TABLET | Freq: Every day | ORAL | 0 refills | Status: DC
  Filled 2021-12-06: qty 18, 4d supply, fill #0
  Filled 2021-12-11: qty 102, 26d supply, fill #0
  Filled 2021-12-11: qty 120, 30d supply, fill #0
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
## 2021-12-06 ENCOUNTER — Other Ambulatory Visit (HOSPITAL_COMMUNITY): Payer: Self-pay

## 2021-12-07 ENCOUNTER — Ambulatory Visit: Payer: BC Managed Care – PPO

## 2021-12-07 VITALS — BP 141/92 | HR 91

## 2021-12-07 DIAGNOSIS — R29818 Other symptoms and signs involving the nervous system: Secondary | ICD-10-CM

## 2021-12-07 DIAGNOSIS — R2681 Unsteadiness on feet: Secondary | ICD-10-CM

## 2021-12-07 DIAGNOSIS — R2689 Other abnormalities of gait and mobility: Secondary | ICD-10-CM

## 2021-12-07 DIAGNOSIS — M6281 Muscle weakness (generalized): Secondary | ICD-10-CM
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
## 2021-12-11 ENCOUNTER — Other Ambulatory Visit (HOSPITAL_COMMUNITY): Payer: Self-pay

## 2021-12-11 ENCOUNTER — Ambulatory Visit: Payer: BC Managed Care – PPO | Attending: Family Medicine

## 2021-12-11 VITALS — BP 137/87 | HR 95

## 2021-12-11 DIAGNOSIS — M6281 Muscle weakness (generalized): Secondary | ICD-10-CM | POA: Diagnosis present

## 2021-12-11 DIAGNOSIS — R29818 Other symptoms and signs involving the nervous system: Secondary | ICD-10-CM | POA: Insufficient documentation

## 2021-12-11 DIAGNOSIS — R2681 Unsteadiness on feet: Secondary | ICD-10-CM | POA: Insufficient documentation

## 2021-12-11 DIAGNOSIS — R2689 Other abnormalities of gait and mobility: Secondary | ICD-10-CM | POA: Diagnosis present
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
## 2021-12-14 ENCOUNTER — Ambulatory Visit: Payer: BC Managed Care – PPO

## 2021-12-14 VITALS — BP 126/83 | HR 88

## 2021-12-14 DIAGNOSIS — R2689 Other abnormalities of gait and mobility: Secondary | ICD-10-CM | POA: Diagnosis not present

## 2021-12-14 DIAGNOSIS — R2681 Unsteadiness on feet: Secondary | ICD-10-CM

## 2021-12-14 DIAGNOSIS — M6281 Muscle weakness (generalized): Secondary | ICD-10-CM

## 2021-12-14 DIAGNOSIS — R29818 Other symptoms and signs involving the nervous system: Secondary | ICD-10-CM
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
## 2021-12-18 ENCOUNTER — Encounter: Payer: Self-pay | Admitting: Physical Therapy

## 2021-12-18 ENCOUNTER — Ambulatory Visit: Payer: BC Managed Care – PPO | Admitting: Physical Therapy

## 2021-12-18 DIAGNOSIS — R2689 Other abnormalities of gait and mobility: Secondary | ICD-10-CM | POA: Diagnosis not present

## 2021-12-18 DIAGNOSIS — R29818 Other symptoms and signs involving the nervous system: Secondary | ICD-10-CM

## 2021-12-18 DIAGNOSIS — M6281 Muscle weakness (generalized): Secondary | ICD-10-CM

## 2021-12-18 DIAGNOSIS — R2681 Unsteadiness on feet: Secondary | ICD-10-CM
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
## 2021-12-20 ENCOUNTER — Ambulatory Visit: Payer: BC Managed Care – PPO | Admitting: Physical Therapy

## 2021-12-20 ENCOUNTER — Encounter: Payer: Self-pay | Admitting: Physical Therapy

## 2021-12-20 DIAGNOSIS — R2681 Unsteadiness on feet: Secondary | ICD-10-CM

## 2021-12-20 DIAGNOSIS — R2689 Other abnormalities of gait and mobility: Secondary | ICD-10-CM | POA: Diagnosis not present

## 2021-12-20 DIAGNOSIS — R29818 Other symptoms and signs involving the nervous system: Secondary | ICD-10-CM

## 2021-12-20 DIAGNOSIS — M6281 Muscle weakness (generalized): Secondary | ICD-10-CM
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
## 2021-12-25 ENCOUNTER — Ambulatory Visit: Payer: BC Managed Care – PPO | Admitting: Physical Therapy

## 2021-12-25 ENCOUNTER — Encounter: Payer: Self-pay | Admitting: Physical Therapy

## 2021-12-25 DIAGNOSIS — R2689 Other abnormalities of gait and mobility: Secondary | ICD-10-CM

## 2021-12-25 DIAGNOSIS — R2681 Unsteadiness on feet: Secondary | ICD-10-CM

## 2021-12-25 DIAGNOSIS — M6281 Muscle weakness (generalized): Secondary | ICD-10-CM
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
## 2021-12-28 ENCOUNTER — Encounter: Payer: Self-pay | Admitting: Physical Therapy

## 2021-12-28 ENCOUNTER — Ambulatory Visit: Payer: BC Managed Care – PPO | Admitting: Physical Therapy

## 2021-12-28 VITALS — BP 148/88 | HR 84

## 2021-12-28 DIAGNOSIS — M6281 Muscle weakness (generalized): Secondary | ICD-10-CM

## 2021-12-28 DIAGNOSIS — R29818 Other symptoms and signs involving the nervous system: Secondary | ICD-10-CM

## 2021-12-28 DIAGNOSIS — R2681 Unsteadiness on feet: Secondary | ICD-10-CM

## 2021-12-28 DIAGNOSIS — R2689 Other abnormalities of gait and mobility: Secondary | ICD-10-CM | POA: Diagnosis not present
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
## 2022-01-01 ENCOUNTER — Encounter: Payer: Self-pay | Admitting: Physical Therapy

## 2022-01-01 ENCOUNTER — Ambulatory Visit: Payer: BC Managed Care – PPO | Admitting: Physical Therapy

## 2022-01-01 DIAGNOSIS — M6281 Muscle weakness (generalized): Secondary | ICD-10-CM

## 2022-01-01 DIAGNOSIS — R2681 Unsteadiness on feet: Secondary | ICD-10-CM

## 2022-01-01 DIAGNOSIS — R2689 Other abnormalities of gait and mobility: Secondary | ICD-10-CM

## 2022-01-01 DIAGNOSIS — R29818 Other symptoms and signs involving the nervous system: Secondary | ICD-10-CM
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
## 2022-01-02 ENCOUNTER — Other Ambulatory Visit (HOSPITAL_COMMUNITY): Payer: Self-pay

## 2022-01-03 ENCOUNTER — Other Ambulatory Visit (HOSPITAL_COMMUNITY): Payer: Self-pay

## 2022-01-04 ENCOUNTER — Ambulatory Visit: Payer: BC Managed Care – PPO

## 2022-01-04 ENCOUNTER — Telehealth: Payer: Self-pay | Admitting: Oncology

## 2022-01-04 VITALS — BP 153/96 | HR 77

## 2022-01-04 DIAGNOSIS — R2681 Unsteadiness on feet: Secondary | ICD-10-CM

## 2022-01-04 DIAGNOSIS — R29818 Other symptoms and signs involving the nervous system: Secondary | ICD-10-CM

## 2022-01-04 DIAGNOSIS — R2689 Other abnormalities of gait and mobility: Secondary | ICD-10-CM | POA: Diagnosis not present

## 2022-01-04 DIAGNOSIS — M6281 Muscle weakness (generalized): Secondary | ICD-10-CM

## 2022-01-04 NOTE — Telephone Encounter (Signed)
Called patient regarding upcoming appointment, left a voicemail. 
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
## 2022-01-08 ENCOUNTER — Ambulatory Visit: Payer: BC Managed Care – PPO

## 2022-01-08 ENCOUNTER — Other Ambulatory Visit (HOSPITAL_COMMUNITY): Payer: Self-pay

## 2022-01-08 VITALS — BP 143/91 | HR 82

## 2022-01-08 DIAGNOSIS — M6281 Muscle weakness (generalized): Secondary | ICD-10-CM

## 2022-01-08 DIAGNOSIS — R2689 Other abnormalities of gait and mobility: Secondary | ICD-10-CM | POA: Diagnosis not present

## 2022-01-08 DIAGNOSIS — R2681 Unsteadiness on feet: Secondary | ICD-10-CM
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
## 2022-01-09 ENCOUNTER — Other Ambulatory Visit (HOSPITAL_COMMUNITY): Payer: Self-pay

## 2022-01-10 ENCOUNTER — Other Ambulatory Visit (HOSPITAL_COMMUNITY): Payer: Self-pay

## 2022-01-10 ENCOUNTER — Other Ambulatory Visit: Payer: Self-pay

## 2022-01-10 ENCOUNTER — Other Ambulatory Visit: Payer: Self-pay | Admitting: Oncology

## 2022-01-10 ENCOUNTER — Inpatient Hospital Stay: Payer: BC Managed Care – PPO | Attending: Oncology

## 2022-01-10 ENCOUNTER — Inpatient Hospital Stay: Payer: BC Managed Care – PPO | Admitting: Oncology

## 2022-01-10 ENCOUNTER — Inpatient Hospital Stay: Payer: BC Managed Care – PPO

## 2022-01-10 VITALS — BP 154/81 | HR 85 | Temp 98.1°F | Resp 17 | Ht 67.0 in | Wt 190.9 lb

## 2022-01-10 DIAGNOSIS — C61 Malignant neoplasm of prostate: Secondary | ICD-10-CM

## 2022-01-10 DIAGNOSIS — I10 Essential (primary) hypertension: Secondary | ICD-10-CM | POA: Diagnosis not present

## 2022-01-10 DIAGNOSIS — G9520 Unspecified cord compression: Secondary | ICD-10-CM | POA: Insufficient documentation

## 2022-01-10 DIAGNOSIS — C7951 Secondary malignant neoplasm of bone: Secondary | ICD-10-CM | POA: Insufficient documentation

## 2022-01-10 DIAGNOSIS — Z923 Personal history of irradiation: Secondary | ICD-10-CM | POA: Insufficient documentation

## 2022-01-10 LAB — CBC WITH DIFFERENTIAL (CANCER CENTER ONLY)
Abs Immature Granulocytes: 0.01 10*3/uL (ref 0.00–0.07)
Basophils Absolute: 0 10*3/uL (ref 0.0–0.1)
Basophils Relative: 1 %
Eosinophils Absolute: 0.1 10*3/uL (ref 0.0–0.5)
Eosinophils Relative: 2 %
HCT: 39.5 % (ref 39.0–52.0)
Hemoglobin: 14.2 g/dL (ref 13.0–17.0)
Immature Granulocytes: 0 %
Lymphocytes Relative: 33 %
Lymphs Abs: 1.3 10*3/uL (ref 0.7–4.0)
MCH: 31.1 pg (ref 26.0–34.0)
MCHC: 35.9 g/dL (ref 30.0–36.0)
MCV: 86.6 fL (ref 80.0–100.0)
Monocytes Absolute: 0.4 10*3/uL (ref 0.1–1.0)
Monocytes Relative: 11 %
Neutro Abs: 2.1 10*3/uL (ref 1.7–7.7)
Neutrophils Relative %: 53 %
Platelet Count: 181 10*3/uL (ref 150–400)
RBC: 4.56 MIL/uL (ref 4.22–5.81)
RDW: 12.1 % (ref 11.5–15.5)
WBC Count: 4 10*3/uL (ref 4.0–10.5)
nRBC: 0 % (ref 0.0–0.2)

## 2022-01-10 LAB — CMP (CANCER CENTER ONLY)
ALT: 29 U/L (ref 0–44)
AST: 25 U/L (ref 15–41)
Albumin: 4.2 g/dL (ref 3.5–5.0)
Alkaline Phosphatase: 110 U/L (ref 38–126)
Anion gap: 6 (ref 5–15)
BUN: 15 mg/dL (ref 6–20)
CO2: 27 mmol/L (ref 22–32)
Calcium: 10 mg/dL (ref 8.9–10.3)
Chloride: 108 mmol/L (ref 98–111)
Creatinine: 1.01 mg/dL (ref 0.61–1.24)
GFR, Estimated: >60 mL/min (ref >60)
Glucose, Bld: 188 mg/dL — ABNORMAL HIGH (ref 70–99)
Potassium: 3.6 mmol/L (ref 3.5–5.1)
Sodium: 141 mmol/L (ref 135–145)
Total Bilirubin: 0.8 mg/dL (ref 0.3–1.2)
Total Protein: 7.2 g/dL (ref 6.5–8.1)

## 2022-01-10 MED ORDER — ABIRATERONE ACETATE 250 MG PO TABS
1000.0000 mg | ORAL_TABLET | Freq: Every day | ORAL | 0 refills | Status: DC
  Filled 2022-01-10 – 2022-01-18 (×3): qty 120, 30d supply, fill #0

## 2022-01-10 MED ORDER — DENOSUMAB 120 MG/1.7ML ~~LOC~~ SOLN
120.0000 mg | Freq: Once | SUBCUTANEOUS | Status: AC
  Administered 2022-01-10: 120 mg via SUBCUTANEOUS
  Filled 2022-01-10: qty 1.7

## 2022-01-10 NOTE — Progress Notes (Signed)
Hematology and Oncology Follow Up Visit  Clifford Jenkins [id number] [birth year] 61 y.o. 01/10/2022 12:35 PM [name] [name], PABecker, [name], [place]   Principle Diagnosis: 61 year old man with castration-sensitive advanced prostate cancer with disease to the bone noted January 2023.  He presented with PSA of 674.   Prior Therapy:   He is status post T5-6 and T6-7 laminectomy and resection of his thoracic tumor completed by Dr. Arnoldo Morale on August 19, 2021.  At that time the final pathology showed metastatic carcinoma compatible with prostate origin.  He is status post radiation therapy to the thoracic spine with total of 30 Gray in 10 fractions completed in February 2023.  Androgen deprivation therapy started with Firmagon 240 mg on August 21, 2021.   Current therapy:   Eligard 30 mg every 4 months started on October 15, 2021.  This will be repeated in July 2023.  Zytiga 1000 mg with prednisone 5 mg daily started on October 18, 2021.  Interim History: Clifford Jenkins is here for a follow-up visit.  Since last visit, he reports feeling well without any major complaints.  He denies any complications related to Zytiga.  He denies any nausea, fatigue or edema.  He denies any worsening bone pain.  He continues to ambulate with the help of a cane but continues to participate in physical therapy.  He is exercising regularly and has intentionally lost weight.     Medications: Reviewed without changes. Current Outpatient Medications  Medication Sig Dispense Refill   abiraterone acetate (ZYTIGA) 250 MG tablet Take 4 tablets (1,000 mg total) by mouth daily. Take on an empty stomach 1 hour before or 2 hours after a meal 120 tablet 0   acetaminophen (TYLENOL) 325 MG tablet Take 2 tablets (650 mg total) by mouth every 4 (four) hours as needed for mild pain ((score 1 to 3) or temp > 100.5). (Patient not taking: Reported on 09/11/2021)     amLODipine (NORVASC) 10 MG tablet Take 1 tablet (10 mg total) by mouth  daily. 30 tablet 0   calcium-vitamin D (OSCAL WITH D) 500-5 MG-MCG tablet Take 1 tablet by mouth 2 (two) times daily. 60 tablet 3   cyclobenzaprine (FLEXERIL) 10 MG tablet Take 1 tablet (10 mg total) by mouth 3 (three) times daily as needed for muscle spasms. (Patient not taking: Reported on 09/11/2021) 60 tablet 0   docusate sodium (COLACE) 100 MG capsule Take 1 capsule (100 mg total) by mouth 2 (two) times daily. (Patient not taking: Reported on 09/11/2021) 10 capsule 0   methocarbamol (ROBAXIN) 500 MG tablet Take 1 tablet (500 mg total) by mouth 2 (two) times daily. (Patient not taking: Reported on 09/11/2021) 30 tablet 0   Oxycodone HCl 10 MG TABS Take 1 tablet (10 mg total) by mouth every 4 (four) hours as needed for moderate pain. (Patient not taking: Reported on 09/11/2021) 30 tablet 0   polyethylene glycol (MIRALAX / GLYCOLAX) 17 g packet Take 17 g by mouth daily. (Patient not taking: Reported on 09/11/2021) 14 each 0   predniSONE (DELTASONE) 5 MG tablet Take 1 tablet (5 mg total) by mouth daily with breakfast. 90 tablet 1   sucralfate (CARAFATE) 1 g tablet Take 1 tablet (1 g total) by mouth 4 (four) times daily -  with meals and at bedtime. 5 min before meals for radiation induced esophagitis 60 tablet 2   valsartan (DIOVAN) 160 MG tablet Take 1 tablet (160 mg total) by mouth daily. 30 tablet 0  No current facility-administered medications for this visit.     Allergies: No Known Allergies    Physical Exam:  Blood pressure (!) 154/81, pulse 85, temperature 98.1 F (36.7 C), temperature source Temporal, resp. rate 17, height '5\' 7"'$  (1.702 m), weight 190 lb 14.4 oz (86.6 kg), SpO2 100 %.   ECOG: 1   General appearance: Comfortable appearing without any discomfort Head: Normocephalic without any trauma Oropharynx: Mucous membranes are moist and pink without any thrush or ulcers. Eyes: Pupils are equal and round reactive to light. Lymph nodes: No cervical, supraclavicular, inguinal or  axillary lymphadenopathy.   Heart:regular rate and rhythm.  S1 and S2 without leg edema. Lung: Clear without any rhonchi or wheezes.  No dullness to percussion. Abdomin: Soft, nontender, nondistended with good bowel sounds.  No hepatosplenomegaly. Musculoskeletal: No joint deformity or effusion.  Full range of motion noted. Neurological: No deficits noted on motor, sensory and deep tendon reflex exam. Skin: No petechial rash or dryness.  Appeared moist.         Lab Results: Lab Results  Component Value Date   WBC 4.6 11/27/2021   HGB 13.8 11/27/2021   HCT 39.1 11/27/2021   MCV 87.1 11/27/2021   PLT PLATELET CLUMPS NOTED ON SMEAR, UNABLE TO ESTIMATE 11/27/2021     Chemistry      Component Value Date/Time   NA 147 (H) 11/27/2021 1508   K 3.9 11/27/2021 1508   CL 113 (H) 11/27/2021 1508   CO2 27 11/27/2021 1508   BUN 12 11/27/2021 1508   CREATININE 1.07 11/27/2021 1508      Component Value Date/Time   CALCIUM 9.8 11/27/2021 1508   ALKPHOS 102 11/27/2021 1508   AST 22 11/27/2021 1508   ALT 23 11/27/2021 1508   BILITOT 0.5 11/27/2021 1508      Latest Reference Range & Units 08/20/21 14:25 10/15/21 08:16 11/27/21 15:08  Prostate Specific Ag, Serum 0.0 - 4.0 ng/mL  38.3 (H) 0.3  Prostatic Specific Antigen 0.00 - 4.00 ng/mL 674.00 (H)    (H): Data is abnormally high     Impression and Plan:   61 year old man with:  1.  Advanced prostate cancer diagnosed in January 2023.  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   The natural course of this disease was reviewed at this time and treatment choices were discussed.  He continues to have excellent PSA response to [facility] currently down to 0.3.  Risks and benefits of continuing Zytiga long-term were discussed.  Complications including hypertension, adrenal sufficiency among others were reviewed.  He is agreeable to continue.  2.  Thoracic spine cord compression: He is status post surgery and  radiation.  No residual issues noted.   3.  Androgen deprivation therapy: His next Eligard will be given in July 2023.  Complications include weight gain, hot flashes among others were reviewed.   4.  Bone directed therapy: Risks and benefits of continuing Xgeva at this time were discussed.  Complications that include osteonecrosis of the jaw and hypocalcemia were reviewed.  He is agreeable to proceed at this time after completing dental clearance.  5.  Hypertension: Blood pressure is mildly elevated but overall manageable.  We will continue to monitor between visits.   6.  Follow-up: In 2 months for repeat follow-up.     30  minutes were dedicated to this encounter.  Time was spent on reviewing laboratory data, disease status update and outlining future plan of care discussion.  Zola Button, MD 6/1/202312:35 PM

## 2022-01-11 ENCOUNTER — Ambulatory Visit: Payer: BC Managed Care – PPO | Attending: Family Medicine | Admitting: Physical Therapy

## 2022-01-11 ENCOUNTER — Encounter: Payer: Self-pay | Admitting: Physical Therapy

## 2022-01-11 VITALS — BP 133/86 | HR 75

## 2022-01-11 DIAGNOSIS — R2689 Other abnormalities of gait and mobility: Secondary | ICD-10-CM | POA: Insufficient documentation

## 2022-01-11 DIAGNOSIS — R2681 Unsteadiness on feet: Secondary | ICD-10-CM | POA: Diagnosis present

## 2022-01-11 DIAGNOSIS — M6281 Muscle weakness (generalized): Secondary | ICD-10-CM | POA: Diagnosis present

## 2022-01-11 DIAGNOSIS — R29818 Other symptoms and signs involving the nervous system: Secondary | ICD-10-CM | POA: Insufficient documentation

## 2022-01-11 LAB — PROSTATE-SPECIFIC AG, SERUM (LABCORP): Prostate Specific Ag, Serum: <0.1 ng/mL (ref 0.0–4.0)
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
## 2022-01-14 ENCOUNTER — Other Ambulatory Visit (HOSPITAL_COMMUNITY): Payer: Self-pay

## 2022-01-15 ENCOUNTER — Ambulatory Visit: Payer: BC Managed Care – PPO

## 2022-01-16 ENCOUNTER — Telehealth: Payer: Self-pay | Admitting: *Deleted

## 2022-01-16 NOTE — Telephone Encounter (Signed)
-----   Message from Wyatt Portela, MD sent at 01/16/2022  8:38 AM EDT ----- Please let him know his PSA is low

## 2022-01-16 NOTE — Telephone Encounter (Signed)
Notified of message below

## 2022-01-18 ENCOUNTER — Ambulatory Visit: Payer: BC Managed Care – PPO | Admitting: Physical Therapy

## 2022-01-18 ENCOUNTER — Telehealth: Payer: Self-pay

## 2022-01-18 ENCOUNTER — Other Ambulatory Visit (HOSPITAL_COMMUNITY): Payer: Self-pay

## 2022-01-18 NOTE — Telephone Encounter (Signed)
[employer] Disability paperwork signed by Dr. Alen Blew and returned to pt. Copy scanned into chart.

## 2022-01-22 ENCOUNTER — Ambulatory Visit: Payer: BC Managed Care – PPO

## 2022-01-22 VITALS — BP 140/92 | HR 93

## 2022-01-22 DIAGNOSIS — R29818 Other symptoms and signs involving the nervous system: Secondary | ICD-10-CM

## 2022-01-22 DIAGNOSIS — R2681 Unsteadiness on feet: Secondary | ICD-10-CM

## 2022-01-22 DIAGNOSIS — R2689 Other abnormalities of gait and mobility: Secondary | ICD-10-CM

## 2022-01-22 DIAGNOSIS — M6281 Muscle weakness (generalized): Secondary | ICD-10-CM
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
## 2022-01-25 ENCOUNTER — Ambulatory Visit: Payer: BC Managed Care – PPO | Admitting: Physical Therapy

## 2022-01-29 ENCOUNTER — Ambulatory Visit: Payer: BC Managed Care – PPO | Admitting: Physical Therapy

## 2022-01-29 ENCOUNTER — Encounter: Payer: Self-pay | Admitting: Physical Therapy

## 2022-01-29 VITALS — BP 141/91 | HR 103

## 2022-01-29 DIAGNOSIS — M6281 Muscle weakness (generalized): Secondary | ICD-10-CM

## 2022-01-29 DIAGNOSIS — R2689 Other abnormalities of gait and mobility: Secondary | ICD-10-CM

## 2022-01-29 DIAGNOSIS — R2681 Unsteadiness on feet: Secondary | ICD-10-CM

## 2022-01-29 DIAGNOSIS — R29818 Other symptoms and signs involving the nervous system: Secondary | ICD-10-CM
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
## 2022-01-31 ENCOUNTER — Telehealth: Payer: Self-pay | Admitting: *Deleted

## 2022-01-31 NOTE — Telephone Encounter (Signed)
Connected with DIANNA DESHLER [phone] (home) regarding [employer] medical leave form delivered to office today.  Requested he sign  Authorization for Use/Disclosure required for electronic, verbal or paper use of PHI. Reports he is currently returning to [facility] to sign.  Asked for copy of paperwork when completed.  No further questions or needs.

## 2022-02-05 ENCOUNTER — Other Ambulatory Visit (HOSPITAL_COMMUNITY): Payer: Self-pay

## 2022-02-05 ENCOUNTER — Telehealth: Payer: Self-pay | Admitting: Oncology

## 2022-02-05 ENCOUNTER — Encounter: Payer: Self-pay | Admitting: Physical Therapy

## 2022-02-05 ENCOUNTER — Ambulatory Visit: Payer: BC Managed Care – PPO | Admitting: Physical Therapy

## 2022-02-05 DIAGNOSIS — R2689 Other abnormalities of gait and mobility: Secondary | ICD-10-CM

## 2022-02-05 DIAGNOSIS — R2681 Unsteadiness on feet: Secondary | ICD-10-CM

## 2022-02-05 DIAGNOSIS — R29818 Other symptoms and signs involving the nervous system: Secondary | ICD-10-CM

## 2022-02-05 DIAGNOSIS — M6281 Muscle weakness (generalized): Secondary | ICD-10-CM

## 2022-02-05 NOTE — Telephone Encounter (Signed)
Called patient regarding upcoming August appointments, voicemail doesn't seem to be set up. Calender will be mailed.

## 2022-02-08 ENCOUNTER — Telehealth: Payer: Self-pay

## 2022-02-08 NOTE — Telephone Encounter (Signed)
Notified Patient of completion of FMLA paperwork. Fax transmission confirmation received. Copy of paperwork placed for pick-up as requested by Patient. No other needs or concerns voiced at this time.

## 2022-02-13 ENCOUNTER — Ambulatory Visit: Payer: BC Managed Care – PPO | Attending: Family Medicine

## 2022-02-13 DIAGNOSIS — M6281 Muscle weakness (generalized): Secondary | ICD-10-CM | POA: Insufficient documentation

## 2022-02-13 DIAGNOSIS — R2689 Other abnormalities of gait and mobility: Secondary | ICD-10-CM | POA: Insufficient documentation

## 2022-02-13 DIAGNOSIS — R2681 Unsteadiness on feet: Secondary | ICD-10-CM | POA: Diagnosis present

## 2022-02-13 DIAGNOSIS — R29818 Other symptoms and signs involving the nervous system: Secondary | ICD-10-CM | POA: Diagnosis present
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
## 2022-02-15 ENCOUNTER — Other Ambulatory Visit (HOSPITAL_COMMUNITY): Payer: Self-pay

## 2022-02-15 ENCOUNTER — Other Ambulatory Visit: Payer: Self-pay | Admitting: Oncology

## 2022-02-15 MED ORDER — ABIRATERONE ACETATE 250 MG PO TABS
1000.0000 mg | ORAL_TABLET | Freq: Every day | ORAL | 0 refills | Status: DC
  Filled 2022-02-15: qty 120, 30d supply, fill #0

## 2022-02-16 ENCOUNTER — Other Ambulatory Visit (HOSPITAL_COMMUNITY): Payer: Self-pay

## 2022-02-20 ENCOUNTER — Ambulatory Visit: Payer: BC Managed Care – PPO

## 2022-02-26 ENCOUNTER — Encounter: Payer: Self-pay | Admitting: Physical Therapy

## 2022-02-26 ENCOUNTER — Ambulatory Visit: Payer: BC Managed Care – PPO | Admitting: Physical Therapy

## 2022-02-26 DIAGNOSIS — R2681 Unsteadiness on feet: Secondary | ICD-10-CM

## 2022-02-26 DIAGNOSIS — R2689 Other abnormalities of gait and mobility: Secondary | ICD-10-CM

## 2022-02-26 DIAGNOSIS — M6281 Muscle weakness (generalized): Secondary | ICD-10-CM
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
## 2022-03-05 ENCOUNTER — Ambulatory Visit: Payer: BC Managed Care – PPO

## 2022-03-05 DIAGNOSIS — R2689 Other abnormalities of gait and mobility: Secondary | ICD-10-CM

## 2022-03-05 DIAGNOSIS — R2681 Unsteadiness on feet: Secondary | ICD-10-CM

## 2022-03-05 DIAGNOSIS — R29818 Other symptoms and signs involving the nervous system: Secondary | ICD-10-CM

## 2022-03-05 DIAGNOSIS — M6281 Muscle weakness (generalized): Secondary | ICD-10-CM
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
## 2022-03-06 ENCOUNTER — Telehealth: Payer: Self-pay | Admitting: *Deleted

## 2022-03-06 NOTE — Telephone Encounter (Signed)
[employer] leave [employer] form Disability claim no. #[id number] completed today by this nurse.  Folder currently placed in bin for collaborative pick up for assigned provider review, signature and return to this nurse to process.  Awaiting return to this nurse upon provider review and signature.

## 2022-03-09 ENCOUNTER — Other Ambulatory Visit: Payer: Self-pay | Admitting: Oncology

## 2022-03-12 NOTE — Telephone Encounter (Signed)
Paperwork returned today from collaborative signed by provider.   [employer] twelve-weeks FMLA, fifty-weeks short-term disability paperwork successfully faxed to [phone] at this time.  Original copy to alphabetical file folder behind appointment registration area number one for pick up as requested per patient.  Process completed with copy to bin designated for items to be scanned.  No further instructions received, actions performed or required by this nurse.

## 2022-03-13 ENCOUNTER — Other Ambulatory Visit: Payer: Self-pay | Admitting: Oncology

## 2022-03-13 ENCOUNTER — Other Ambulatory Visit (HOSPITAL_COMMUNITY): Payer: Self-pay

## 2022-03-13 MED ORDER — ABIRATERONE ACETATE 250 MG PO TABS
1000.0000 mg | ORAL_TABLET | Freq: Every day | ORAL | 0 refills | Status: DC
  Filled 2022-03-13: qty 120, 30d supply, fill #0

## 2022-03-14 ENCOUNTER — Inpatient Hospital Stay: Payer: BC Managed Care – PPO | Admitting: Oncology

## 2022-03-14 ENCOUNTER — Inpatient Hospital Stay: Payer: BC Managed Care – PPO | Attending: Oncology

## 2022-03-14 ENCOUNTER — Other Ambulatory Visit: Payer: Self-pay

## 2022-03-14 ENCOUNTER — Inpatient Hospital Stay: Payer: BC Managed Care – PPO

## 2022-03-14 VITALS — BP 155/94 | HR 88 | Temp 98.7°F | Resp 16 | Ht 67.0 in | Wt 192.1 lb

## 2022-03-14 DIAGNOSIS — Z5111 Encounter for antineoplastic chemotherapy: Secondary | ICD-10-CM | POA: Insufficient documentation

## 2022-03-14 DIAGNOSIS — C61 Malignant neoplasm of prostate: Secondary | ICD-10-CM | POA: Insufficient documentation

## 2022-03-14 DIAGNOSIS — C7951 Secondary malignant neoplasm of bone: Secondary | ICD-10-CM | POA: Insufficient documentation

## 2022-03-14 LAB — CMP (CANCER CENTER ONLY)
ALT: 68 U/L — ABNORMAL HIGH (ref 0–44)
AST: 41 U/L (ref 15–41)
Albumin: 4.4 g/dL (ref 3.5–5.0)
Alkaline Phosphatase: 83 U/L (ref 38–126)
Anion gap: 6 (ref 5–15)
BUN: 12 mg/dL (ref 6–20)
CO2: 26 mmol/L (ref 22–32)
Calcium: 8.9 mg/dL (ref 8.9–10.3)
Chloride: 108 mmol/L (ref 98–111)
Creatinine: 0.96 mg/dL (ref 0.61–1.24)
GFR, Estimated: >60 mL/min (ref >60)
Glucose, Bld: 122 mg/dL — ABNORMAL HIGH (ref 70–99)
Potassium: 4.2 mmol/L (ref 3.5–5.1)
Sodium: 140 mmol/L (ref 135–145)
Total Bilirubin: 0.7 mg/dL (ref 0.3–1.2)
Total Protein: 7.6 g/dL (ref 6.5–8.1)

## 2022-03-14 LAB — CBC WITH DIFFERENTIAL (CANCER CENTER ONLY)
Abs Immature Granulocytes: 0 10*3/uL (ref 0.00–0.07)
Basophils Absolute: 0 10*3/uL (ref 0.0–0.1)
Basophils Relative: 0 %
Eosinophils Absolute: 0.1 10*3/uL (ref 0.0–0.5)
Eosinophils Relative: 3 %
HCT: 40.4 % (ref 39.0–52.0)
Hemoglobin: 14.5 g/dL (ref 13.0–17.0)
Immature Granulocytes: 0 %
Lymphocytes Relative: 32 %
Lymphs Abs: 1.5 10*3/uL (ref 0.7–4.0)
MCH: 31.7 pg (ref 26.0–34.0)
MCHC: 35.9 g/dL (ref 30.0–36.0)
MCV: 88.2 fL (ref 80.0–100.0)
Monocytes Absolute: 0.6 10*3/uL (ref 0.1–1.0)
Monocytes Relative: 12 %
Neutro Abs: 2.6 10*3/uL (ref 1.7–7.7)
Neutrophils Relative %: 53 %
Platelet Count: 174 10*3/uL (ref 150–400)
RBC: 4.58 MIL/uL (ref 4.22–5.81)
RDW: 11.9 % (ref 11.5–15.5)
WBC Count: 4.9 10*3/uL (ref 4.0–10.5)
nRBC: 0 % (ref 0.0–0.2)

## 2022-03-14 MED ORDER — LEUPROLIDE ACETATE (4 MONTH) 30 MG ~~LOC~~ KIT
30.0000 mg | PACK | Freq: Once | SUBCUTANEOUS | Status: AC
  Administered 2022-03-14: 30 mg via SUBCUTANEOUS
  Filled 2022-03-14: qty 30

## 2022-03-14 MED ORDER — DENOSUMAB 120 MG/1.7ML ~~LOC~~ SOLN
120.0000 mg | Freq: Once | SUBCUTANEOUS | Status: AC
  Administered 2022-03-14: 120 mg via SUBCUTANEOUS
  Filled 2022-03-14: qty 1.7

## 2022-03-14 NOTE — Progress Notes (Signed)
Genetic referral placed after speaking with patient, he was agreeable for testing.

## 2022-03-14 NOTE — Progress Notes (Signed)
Hematology and Oncology Follow Up Visit  Clifford Jenkins [id number] [birth year] 61 y.o. 03/14/2022 8:39 AM Clifford Jenkins Clifford Jenkins, PABecker, Clifford Jenkins, [place]   Principle Diagnosis: 61 year old man with advanced prostate cancer with disease to the bone diagnosed in January 2023 with a PSA of 674.  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   Prior Therapy:   He is status post T5-6 and T6-7 laminectomy and resection of his thoracic tumor completed by Dr. Arnoldo Morale on August 19, 2021.  At that time the final pathology showed metastatic carcinoma compatible with prostate origin.  He is status post radiation therapy to the thoracic spine with total of 30 Gray in 10 fractions completed in February 2023.  Androgen deprivation therapy started with Firmagon 240 mg on August 21, 2021.   Current therapy:   Eligard 30 mg every 4 months started on October 15, 2021.  This will be repeated today and in 4 months.  Zytiga 1000 mg with prednisone 5 mg daily started on October 18, 2021.  Xgeva 120 mg every 8 weeks started on January 10, 2022.  Interim History: [name] returns today for a follow-up.  Since last visit, he reports feeling well without any major complaints.  He continues to recover from his laminectomy surgery.  He is ambulating without the help of a cane without any falls or syncope.  He denies any worsening back pain or neurological deficits.  He denies any complications related to Zytiga.  He denies any edema or headaches.  His quality of life remains excellent.     Medications: Updated on review Current Outpatient Medications  Medication Sig Dispense Refill   abiraterone acetate (ZYTIGA) 250 MG tablet Take 4 tablets (1,000 mg total) by mouth daily. Take on an empty stomach 1 hour before or 2 hours after a meal 120 tablet 0   acetaminophen (TYLENOL) 325 MG tablet Take 2 tablets (650 mg total) by mouth every 4 (four) hours as needed for mild pain ((score 1 to 3) or temp > 100.5).      amLODipine (NORVASC) 10 MG tablet Take 1 tablet (10 mg total) by mouth daily. 30 tablet 0   calcium-vitamin D (OSCAL WITH D) 500-5 MG-MCG tablet Take 1 tablet by mouth 2 (two) times daily. 60 tablet 3   cyclobenzaprine (FLEXERIL) 10 MG tablet Take 1 tablet (10 mg total) by mouth 3 (three) times daily as needed for muscle spasms. 60 tablet 0   docusate sodium (COLACE) 100 MG capsule Take 1 capsule (100 mg total) by mouth 2 (two) times daily. 10 capsule 0   methocarbamol (ROBAXIN) 500 MG tablet Take 1 tablet (500 mg total) by mouth 2 (two) times daily. 30 tablet 0   Oxycodone HCl 10 MG TABS Take 1 tablet (10 mg total) by mouth every 4 (four) hours as needed for moderate pain. 30 tablet 0   polyethylene glycol (MIRALAX / GLYCOLAX) 17 g packet Take 17 g by mouth daily. 14 each 0   predniSONE (DELTASONE) 5 MG tablet TAKE 1 TABLET BY MOUTH EVERY DAY WITH BREAKFAST 90 tablet 1   sucralfate (CARAFATE) 1 g tablet Take 1 tablet (1 g total) by mouth 4 (four) times daily -  with meals and at bedtime. 5 min before meals for radiation induced esophagitis 60 tablet 2   valsartan (DIOVAN) 160 MG tablet Take 1 tablet (160 mg total) by mouth daily. 30 tablet 0   No current facility-administered medications for this visit.  Allergies: No Known Allergies    Physical Exam:  Blood pressure (!) 155/94, pulse 88, temperature 98.7 F (37.1 C), temperature source Oral, resp. rate 16, height '5\' 7"'$  (1.702 m), weight 192 lb 1.6 oz (87.1 kg), SpO2 100 %.    ECOG: 1   General appearance: Alert, awake without any distress. Head: Atraumatic without abnormalities Oropharynx: Without any thrush or ulcers. Eyes: No scleral icterus. Lymph nodes: No lymphadenopathy noted in the cervical, supraclavicular, or axillary nodes Heart:regular rate and rhythm, without any murmurs or gallops.   Lung: Clear to auscultation without any rhonchi, wheezes or dullness to percussion. Abdomin: Soft, nontender without any shifting  dullness or ascites. Musculoskeletal: No clubbing or cyanosis. Neurological: No motor or sensory deficits. Skin: No rashes or lesions.          Lab Results: Lab Results  Component Value Date   WBC 4.0 01/10/2022   HGB 14.2 01/10/2022   HCT 39.5 01/10/2022   MCV 86.6 01/10/2022   PLT 181 01/10/2022   PSA 0.30 07/26/2012     Chemistry      Component Value Date/Time   NA 141 01/10/2022 1239   K 3.6 01/10/2022 1239   CL 108 01/10/2022 1239   CO2 27 01/10/2022 1239   BUN 15 01/10/2022 1239   CREATININE 1.01 01/10/2022 1239      Component Value Date/Time   CALCIUM 10.0 01/10/2022 1239   ALKPHOS 110 01/10/2022 1239   AST 25 01/10/2022 1239   ALT 29 01/10/2022 1239   BILITOT 0.8 01/10/2022 1239       Latest Reference Range & Units 10/15/21 08:16 11/27/21 15:08 01/10/22 12:39  Prostate Specific Ag, Serum 0.0 - 4.0 ng/mL 38.3 (H) 0.3 <0.1  (H): Data is abnormally high   Impression and Plan:   61 year old man with:  1.  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  He has disease to the bone and lymphadenopathy.  He continues to tolerate Zytiga reasonably well with PSA undetectable.  Risks and benefits of continuing this treatment long-term were discussed.  Complications that include nausea, fatigue, edema and adrenal insufficiency were reiterated.  Alternative treatment options including Taxotere chemotherapy among others will be deferred at this time.  He is agreeable to continue.  2.  Thoracic spine cord compression: He denies any residual pain or discomfort.  He is status post surgical intervention.   3.  Androgen deprivation therapy: He will receive Eligard today and repeat in 4 months.   4.  Bone directed therapy: Risks and benefits of continuing Xgeva were discussed at this time.  These include osteonecrosis of the jaw and hypocalcemia.  He is agreeable to proceed.  5.  Hypertension: Blood pressure is mildly elevated at this time.   We will continue to monitor and adjust antihypertensive medication needed in the future.   6.  Follow-up: In 2 months for repeat follow-up.     30  minutes were spent on this visit.  Time was dedicated to reviewing laboratory data, disease status update, addressing complication related to cancer and cancer therapy.       Zola Button, MD 8/3/20238:39 AM

## 2022-03-15 ENCOUNTER — Telehealth: Payer: Self-pay | Admitting: Oncology

## 2022-03-15 ENCOUNTER — Other Ambulatory Visit (HOSPITAL_COMMUNITY): Payer: Self-pay

## 2022-03-15 NOTE — Telephone Encounter (Signed)
Scheduled per 8/3 in basket, pt has been called and confirmed

## 2022-03-16 LAB — PROSTATE-SPECIFIC AG, SERUM (LABCORP): Prostate Specific Ag, Serum: <0.1 ng/mL (ref 0.0–4.0)

## 2022-03-18 ENCOUNTER — Telehealth: Payer: Self-pay

## 2022-03-18 ENCOUNTER — Other Ambulatory Visit (HOSPITAL_COMMUNITY): Payer: Self-pay

## 2022-03-18 NOTE — Telephone Encounter (Signed)
-----   Message from Wyatt Portela, MD sent at 03/18/2022  8:27 AM EDT ----- Please let him know his PSA is still low

## 2022-03-18 NOTE — Telephone Encounter (Signed)
Contacted patient and made him aware of PSA results. No questions or concerns.

## 2022-03-19 ENCOUNTER — Other Ambulatory Visit (HOSPITAL_COMMUNITY): Payer: Self-pay

## 2022-03-20 ENCOUNTER — Other Ambulatory Visit (HOSPITAL_BASED_OUTPATIENT_CLINIC_OR_DEPARTMENT_OTHER): Payer: Self-pay

## 2022-03-20 ENCOUNTER — Other Ambulatory Visit (HOSPITAL_COMMUNITY): Payer: Self-pay

## 2022-03-22 ENCOUNTER — Telehealth: Payer: Self-pay

## 2022-03-22 ENCOUNTER — Encounter: Payer: Self-pay | Admitting: General Practice

## 2022-03-22 DIAGNOSIS — C7951 Secondary malignant neoplasm of bone: Secondary | ICD-10-CM

## 2022-03-22 NOTE — Telephone Encounter (Signed)
Pt presented to lobby stating that his insurance had been cancelled. He provided [employer] paperwork to be completed by Dr. Alen Blew to be faxed to [phone] attention: Jazmine. Patient provided with original paperwork and fax confirmation copy.   Patient tearful stating that he felt overwhelmed. Lorrin Jackson Chaplain paged to speak with patient. Referral placed for social work consult.

## 2022-03-22 NOTE — Progress Notes (Signed)
[facility] Spiritual Care Note  Referred by Registration for emotional support due to distress. Clifford Jenkins is struggling with loss of ability to work and consequent loss of both income and identity. He notes that he is accustomed to working hard and to seeing himself as a Scientist, research (physical sciences).  Visited with Clifford Jenkins in the lobby, providing empathic listening, emotional support, and witness to his struggle and suffering.  Will follow up by consulting directly with [place] Intern, who starts next week, for emotional support and with Jeani Hawking Duffy/LCSW for financial/logistical support.   [place], [place], [employer] [facility] Pager [phone] Voicemail [phone]

## 2022-03-25 ENCOUNTER — Inpatient Hospital Stay: Payer: BC Managed Care – PPO

## 2022-03-25 NOTE — Progress Notes (Signed)
Clifford Jenkins  Clinical Social Jenkins was referred by spiritual care for assessment of psychosocial needs.  Clinical Social Worker contacted patient by phone to offer support and assess for needs.    CSW offered supportive counseling and active listening. Validated his feelings and discussed his visit with Clifford Jenkins on Friday, 8/11.  [name] stated he felt much better than he did on Friday when speaking with the chaplain.  He has support and is taking "one day at a time".  CSW provided information about supportive role and resources.  He stated he understood and had no needs at the present moment.  He was also waiting for another call resulting in shortened conversation with CSW.  Gave him contact information.  Margaree Mackintosh, LCSW  Clinical Social Worker [facility]

## 2022-04-04 ENCOUNTER — Telehealth: Payer: Self-pay

## 2022-04-04 NOTE — Telephone Encounter (Signed)
Clifford Jenkins, counseling intern, called patient to schedule counseling session.   Client and patient will meet in person Monday, 04/08/22, from 9 am - 9:50 am.   Clifford Jenkins  Counseling Intern

## 2022-04-05 ENCOUNTER — Other Ambulatory Visit (HOSPITAL_COMMUNITY): Payer: Self-pay

## 2022-04-08 ENCOUNTER — Other Ambulatory Visit (HOSPITAL_COMMUNITY): Payer: Self-pay

## 2022-04-08 ENCOUNTER — Encounter: Payer: Self-pay | Admitting: Oncology

## 2022-04-08 ENCOUNTER — Inpatient Hospital Stay: Payer: BC Managed Care – PPO

## 2022-04-08 NOTE — Progress Notes (Signed)
Attempted to call patient after receiving referral from Education officer, museum.  No available voicemail. Patient can contact me directly at [phone] for any financial questions or concerns.

## 2022-04-08 NOTE — Progress Notes (Signed)
Lysle Morales, counseling intern, met in-person with [name] for a counseling session.   Jeffery expressed he did not feel like himself anymore because of a decrease in mobility. Due to this and other treatments, Nickalas is unable to work. Hamdi is in the process of applying for insurance but is currently in limbo. Antonio expressed that waiting for coverage is causing a great amount of stress and mental anguish.   Darly expressed feelings of SI. Jasmine Awe, social worker, will call Emigdio this week to check in.   Counselor and client spoke about ways Jatniel could experience mental calmness this week. Rachael plans to move his body everyday (go for a walk around the track or swimming at the gym). Jessey is also going to reflect on what makes him happy and will bring a list to the next counseling session.   The counselor provided empathetic listening and reflection of feeling.   Indalecio scheduled his next in-person counseling session for Thursday, September 7th at 9 am.   Lysle Morales  Counseling Intern
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
## 2022-04-11 ENCOUNTER — Other Ambulatory Visit (HOSPITAL_COMMUNITY): Payer: Self-pay

## 2022-04-11 ENCOUNTER — Other Ambulatory Visit: Payer: Self-pay | Admitting: Oncology

## 2022-04-11 MED ORDER — ABIRATERONE ACETATE 250 MG PO TABS
1000.0000 mg | ORAL_TABLET | Freq: Every day | ORAL | 0 refills | Status: DC
  Filled 2022-04-11 (×2): qty 120, 30d supply, fill #0

## 2022-04-12 ENCOUNTER — Other Ambulatory Visit (HOSPITAL_COMMUNITY): Payer: Self-pay

## 2022-04-18 ENCOUNTER — Telehealth: Payer: Self-pay

## 2022-04-18 NOTE — Telephone Encounter (Signed)
Patient had an appointment with Lysle Morales, counseling intern, scheduled for this morning at 9 am. The patient did now show. Counselor tried to call patient but could not get through.   Counselor will attempt to call again next week .  Lysle Morales  Counseling Intern

## 2022-04-25 ENCOUNTER — Telehealth: Payer: Self-pay

## 2022-04-25 NOTE — Telephone Encounter (Signed)
Clifford Jenkins, counseling intern, attempted to call patient but patient did not pick up phone.   Patient has counselor's contact information.   Clifford Jenkins  Counseling Intern

## 2022-05-07 ENCOUNTER — Other Ambulatory Visit: Payer: Self-pay | Admitting: Oncology

## 2022-05-07 ENCOUNTER — Other Ambulatory Visit (HOSPITAL_COMMUNITY): Payer: Self-pay

## 2022-05-07 MED ORDER — ABIRATERONE ACETATE 250 MG PO TABS
1000.0000 mg | ORAL_TABLET | Freq: Every day | ORAL | 0 refills | Status: DC
  Filled 2022-05-07: qty 120, 30d supply, fill #0

## 2022-05-09 ENCOUNTER — Inpatient Hospital Stay: Payer: BC Managed Care – PPO | Attending: Oncology | Admitting: Genetic Counselor

## 2022-05-09 ENCOUNTER — Inpatient Hospital Stay: Payer: BC Managed Care – PPO

## 2022-05-09 ENCOUNTER — Other Ambulatory Visit: Payer: Self-pay

## 2022-05-09 ENCOUNTER — Telehealth: Payer: Self-pay | Admitting: Oncology

## 2022-05-09 DIAGNOSIS — C7951 Secondary malignant neoplasm of bone: Secondary | ICD-10-CM | POA: Diagnosis not present

## 2022-05-09 DIAGNOSIS — C61 Malignant neoplasm of prostate: Secondary | ICD-10-CM

## 2022-05-09 DIAGNOSIS — Z803 Family history of malignant neoplasm of breast: Secondary | ICD-10-CM

## 2022-05-09 DIAGNOSIS — Z800 Family history of malignant neoplasm of digestive organs: Secondary | ICD-10-CM

## 2022-05-09 DIAGNOSIS — I10 Essential (primary) hypertension: Secondary | ICD-10-CM | POA: Diagnosis not present

## 2022-05-12 ENCOUNTER — Other Ambulatory Visit: Payer: Self-pay | Admitting: Genetic Counselor

## 2022-05-12 ENCOUNTER — Encounter: Payer: Self-pay | Admitting: Oncology

## 2022-05-12 ENCOUNTER — Encounter: Payer: Self-pay | Admitting: Genetic Counselor

## 2022-05-12 DIAGNOSIS — Z803 Family history of malignant neoplasm of breast: Secondary | ICD-10-CM

## 2022-05-12 DIAGNOSIS — C7951 Secondary malignant neoplasm of bone: Secondary | ICD-10-CM

## 2022-05-12 HISTORY — DX: Family history of malignant neoplasm of breast: Z80.3
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
## 2022-05-14 ENCOUNTER — Inpatient Hospital Stay: Payer: BC Managed Care – PPO

## 2022-05-14 ENCOUNTER — Other Ambulatory Visit: Payer: Self-pay

## 2022-05-14 ENCOUNTER — Inpatient Hospital Stay: Payer: BC Managed Care – PPO | Attending: Oncology

## 2022-05-14 ENCOUNTER — Inpatient Hospital Stay (HOSPITAL_BASED_OUTPATIENT_CLINIC_OR_DEPARTMENT_OTHER): Payer: BC Managed Care – PPO | Admitting: Oncology

## 2022-05-14 VITALS — BP 142/84 | HR 85 | Temp 98.1°F | Resp 17 | Ht 67.0 in | Wt 191.1 lb

## 2022-05-14 DIAGNOSIS — I10 Essential (primary) hypertension: Secondary | ICD-10-CM | POA: Diagnosis not present

## 2022-05-14 DIAGNOSIS — C7951 Secondary malignant neoplasm of bone: Secondary | ICD-10-CM | POA: Insufficient documentation

## 2022-05-14 DIAGNOSIS — C61 Malignant neoplasm of prostate: Secondary | ICD-10-CM

## 2022-05-14 DIAGNOSIS — G9520 Unspecified cord compression: Secondary | ICD-10-CM | POA: Insufficient documentation

## 2022-05-14 DIAGNOSIS — Z923 Personal history of irradiation: Secondary | ICD-10-CM | POA: Insufficient documentation

## 2022-05-14 DIAGNOSIS — R1310 Dysphagia, unspecified: Secondary | ICD-10-CM | POA: Diagnosis not present

## 2022-05-14 LAB — CMP (CANCER CENTER ONLY)
ALT: 48 U/L — ABNORMAL HIGH (ref 0–44)
AST: 37 U/L (ref 15–41)
Albumin: 4.5 g/dL (ref 3.5–5.0)
Alkaline Phosphatase: 60 U/L (ref 38–126)
Anion gap: 6 (ref 5–15)
BUN: 15 mg/dL (ref 6–20)
CO2: 26 mmol/L (ref 22–32)
Calcium: 9 mg/dL (ref 8.9–10.3)
Chloride: 109 mmol/L (ref 98–111)
Creatinine: 0.95 mg/dL (ref 0.61–1.24)
GFR, Estimated: >60 mL/min (ref >60)
Glucose, Bld: 98 mg/dL (ref 70–99)
Potassium: 3.6 mmol/L (ref 3.5–5.1)
Sodium: 141 mmol/L (ref 135–145)
Total Bilirubin: 0.8 mg/dL (ref 0.3–1.2)
Total Protein: 6.9 g/dL (ref 6.5–8.1)

## 2022-05-14 LAB — CBC WITH DIFFERENTIAL (CANCER CENTER ONLY)
Abs Immature Granulocytes: 0.01 10*3/uL (ref 0.00–0.07)
Basophils Absolute: 0 10*3/uL (ref 0.0–0.1)
Basophils Relative: 0 %
Eosinophils Absolute: 0.1 10*3/uL (ref 0.0–0.5)
Eosinophils Relative: 1 %
HCT: 39.1 % (ref 39.0–52.0)
Hemoglobin: 14 g/dL (ref 13.0–17.0)
Immature Granulocytes: 0 %
Lymphocytes Relative: 34 %
Lymphs Abs: 1.5 10*3/uL (ref 0.7–4.0)
MCH: 31.2 pg (ref 26.0–34.0)
MCHC: 35.8 g/dL (ref 30.0–36.0)
MCV: 87.1 fL (ref 80.0–100.0)
Monocytes Absolute: 0.5 10*3/uL (ref 0.1–1.0)
Monocytes Relative: 11 %
Neutro Abs: 2.4 10*3/uL (ref 1.7–7.7)
Neutrophils Relative %: 54 %
Platelet Count: 163 10*3/uL (ref 150–400)
RBC: 4.49 MIL/uL (ref 4.22–5.81)
RDW: 12.2 % (ref 11.5–15.5)
WBC Count: 4.5 10*3/uL (ref 4.0–10.5)
nRBC: 0 % (ref 0.0–0.2)

## 2022-05-14 LAB — GENETIC SCREENING ORDER

## 2022-05-14 MED ORDER — DENOSUMAB 120 MG/1.7ML ~~LOC~~ SOLN
120.0000 mg | Freq: Once | SUBCUTANEOUS | Status: AC
  Administered 2022-05-14: 120 mg via SUBCUTANEOUS
  Filled 2022-05-14: qty 1.7

## 2022-05-14 NOTE — Progress Notes (Signed)
Hematology and Oncology Follow Up Visit  Clifford Jenkins [id number] May 07, 1961 61 y.o. 05/14/2022 10:09 AM Clifford Jenkins Clifford Jenkins, PABecker, Clifford Jenkins, [place]   Principle Diagnosis: 61 year old man with castration-sensitive advanced prostate cancer with disease to the bone diagnosed in January 2023.  He presented with a PSA of 674 and advanced disease at that time.   Prior Therapy:   He is status post T5-6 and T6-7 laminectomy and resection of his thoracic tumor completed by Dr. Arnoldo Morale on August 19, 2021.  At that time the final pathology showed metastatic carcinoma compatible with prostate origin.  He is status post radiation therapy to the thoracic spine with total of 30 Gray in 10 fractions completed in February 2023.  Androgen deprivation therapy started with Firmagon 240 mg on August 21, 2021.   Current therapy:   Eligard 30 mg every 4 months started on October 15, 2021.  Next injection will be scheduled in December 2023.  Zytiga 1000 mg with prednisone 5 mg daily started on October 18, 2021.  Xgeva 120 mg every 8 weeks started on January 10, 2022.  Interim History: [name] is here for a repeat evaluation.  Since last visit, he reports no major changes in his health.  He denies any recent hospitalizations or illnesses.  He has reported some occasional dysphagia especially with solid foods.  This has been noticed after radiation therapy completed earlier this year.  He denies any nausea, vomiting or abdominal pain.  Continues to workout regularly.  He denies any specific complications related to Zytiga or prednisone.     Medications: Reviewed without changes. Current Outpatient Medications  Medication Sig Dispense Refill   abiraterone acetate (ZYTIGA) 250 MG tablet Take 4 tablets (1,000 mg total) by mouth daily. Take on an empty stomach 1 hour before or 2 hours after a meal 120 tablet 0   acetaminophen (TYLENOL) 325 MG tablet Take 2 tablets (650 mg total) by mouth every 4 (four) hours as  needed for mild pain ((score 1 to 3) or temp > 100.5).     amLODipine (NORVASC) 10 MG tablet Take 1 tablet (10 mg total) by mouth daily. 30 tablet 0   calcium-vitamin D (OSCAL WITH D) 500-5 MG-MCG tablet Take 1 tablet by mouth 2 (two) times daily. 60 tablet 3   cyclobenzaprine (FLEXERIL) 10 MG tablet Take 1 tablet (10 mg total) by mouth 3 (three) times daily as needed for muscle spasms. 60 tablet 0   docusate sodium (COLACE) 100 MG capsule Take 1 capsule (100 mg total) by mouth 2 (two) times daily. 10 capsule 0   methocarbamol (ROBAXIN) 500 MG tablet Take 1 tablet (500 mg total) by mouth 2 (two) times daily. 30 tablet 0   Oxycodone HCl 10 MG TABS Take 1 tablet (10 mg total) by mouth every 4 (four) hours as needed for moderate pain. 30 tablet 0   polyethylene glycol (MIRALAX / GLYCOLAX) 17 g packet Take 17 g by mouth daily. 14 each 0   predniSONE (DELTASONE) 5 MG tablet TAKE 1 TABLET BY MOUTH EVERY DAY WITH BREAKFAST 90 tablet 1   sucralfate (CARAFATE) 1 g tablet Take 1 tablet (1 g total) by mouth 4 (four) times daily -  with meals and at bedtime. 5 min before meals for radiation induced esophagitis 60 tablet 2   valsartan (DIOVAN) 160 MG tablet Take 1 tablet (160 mg total) by mouth daily. 30 tablet 0   No current facility-administered medications for this visit.     Allergies:  No Known Allergies    Physical Exam:   Blood pressure (!) 142/84, pulse 85, temperature 98.1 F (36.7 C), temperature source Temporal, resp. rate 17, height '5\' 7"'$  (1.702 m), weight 191 lb 1.6 oz (86.7 kg), SpO2 99 %.    ECOG: 1    General appearance: Comfortable appearing without any discomfort Head: Normocephalic without any trauma Oropharynx: Mucous membranes are moist and pink without any thrush or ulcers. Eyes: Pupils are equal and round reactive to light. Lymph nodes: No cervical, supraclavicular, inguinal or axillary lymphadenopathy.   Heart:regular rate and rhythm.  S1 and S2 without leg  edema. Lung: Clear without any rhonchi or wheezes.  No dullness to percussion. Abdomin: Soft, nontender, nondistended with good bowel sounds.  No hepatosplenomegaly. Musculoskeletal: No joint deformity or effusion.  Full range of motion noted. Neurological: No deficits noted on motor, sensory and deep tendon reflex exam. Skin: No petechial rash or dryness.  Appeared moist.          Lab Results: Lab Results  Component Value Date   WBC 4.9 03/14/2022   HGB 14.5 03/14/2022   HCT 40.4 03/14/2022   MCV 88.2 03/14/2022   PLT 174 03/14/2022   PSA 0.30 07/26/2012     Chemistry      Component Value Date/Time   NA 140 03/14/2022 0829   K 4.2 03/14/2022 0829   CL 108 03/14/2022 0829   CO2 26 03/14/2022 0829   BUN 12 03/14/2022 0829   CREATININE 0.96 03/14/2022 0829      Component Value Date/Time   CALCIUM 8.9 03/14/2022 0829   ALKPHOS 83 03/14/2022 0829   AST 41 03/14/2022 0829   ALT 68 (H) 03/14/2022 0829   BILITOT 0.7 03/14/2022 0829      Latest Reference Range & Units 01/10/22 12:39 03/14/22 08:29  Prostate Specific Ag, Serum 0.0 - 4.0 ng/mL <0.1 <0.1      Impression and Plan:   62 year old man with:  1.  Advanced prostate cancer with disease to the bone and lymphadenopathy diagnosed in January 2023.  He has castration-sensitive disease at this time.  The natural course of this disease was reviewed and treatment options were discussed.  His PSA continues to be undetectable after excellent response to androgen deprivation and Zytiga.  Complication associated with this treatment including hypertension, fluid retention among others were reiterated.  After discussion today is agreeable to continue.  Different salvage therapy options including systemic chemotherapy or PARP inhibitors among others will be reiterated.  He is agreeable to continue.  2.  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   3.  Androgen deprivation  therapy: Next Eligard will be given in December 2023.  Complications that include hot flashes and weight gain were reiterated.   4.  Bone directed therapy: He will receive Xgeva today and repeated in 2 months.  Complication occluding osteonecrosis of the jaw and hypocalcemia were reviewed.  5.  Hypertension: His blood pressure is close to normal range.  6.  Dysphagia: Unclear etiology.  He could have developed strictures related to radiation therapy.  We will refer to GI for work-up.  He could also benefit from a repeat colonoscopy as he is approaching 10 years from his last 1.   7.  Follow-up: He will return in 2 months for repeat follow-up.     30  minutes were spent on this encounter.  Time was dedicated to reviewing laboratory data, disease status update and outlining future plan  of care review.       Zola Button, MD 10/3/202310:09 AM On [phone]

## 2022-05-16 ENCOUNTER — Other Ambulatory Visit (HOSPITAL_COMMUNITY): Payer: Self-pay

## 2022-05-16 ENCOUNTER — Telehealth: Payer: Self-pay | Admitting: *Deleted

## 2022-05-16 LAB — PROSTATE-SPECIFIC AG, SERUM (LABCORP): Prostate Specific Ag, Serum: <0.1 ng/mL (ref 0.0–4.0)

## 2022-05-16 NOTE — Telephone Encounter (Signed)
Attempted contact with patient to provide lab results as per Dr. Hazeline Junker direction. No answer at preferred number

## 2022-05-17 ENCOUNTER — Other Ambulatory Visit (HOSPITAL_COMMUNITY): Payer: Self-pay

## 2022-05-20 ENCOUNTER — Telehealth: Payer: Self-pay | Admitting: *Deleted

## 2022-05-20 NOTE — Telephone Encounter (Signed)
-----   Message from Wyatt Portela, MD sent at 05/16/2022  9:16 AM EDT ----- Please let him know his PSA is down

## 2022-05-20 NOTE — Telephone Encounter (Signed)
Attempted contact with patient to provide lab results as per Dr. Hazeline Junker direction. No answer at preferred number  Reached recording "Call cannot be completed at this time"

## 2022-05-20 NOTE — Telephone Encounter (Signed)
Contacted patient as directed by Dr. Alen Blew in message below. Patient verbalized understanding.

## 2022-05-21 ENCOUNTER — Encounter: Payer: Self-pay | Admitting: Oncology

## 2022-05-22 ENCOUNTER — Other Ambulatory Visit (HOSPITAL_COMMUNITY): Payer: Self-pay

## 2022-05-27 ENCOUNTER — Telehealth: Payer: Self-pay

## 2022-05-27 NOTE — Telephone Encounter (Signed)
Notified Patient of completion of Disability Extension Form. Fax transmission confirmation received. Copy of form mailed to Patient as requested. No other needs or concerns voiced at this time.

## 2022-06-06 ENCOUNTER — Other Ambulatory Visit: Payer: Self-pay | Admitting: Oncology

## 2022-06-06 ENCOUNTER — Other Ambulatory Visit (HOSPITAL_COMMUNITY): Payer: Self-pay

## 2022-06-06 DIAGNOSIS — C61 Malignant neoplasm of prostate: Secondary | ICD-10-CM

## 2022-06-06 MED ORDER — ABIRATERONE ACETATE 250 MG PO TABS
1000.0000 mg | ORAL_TABLET | Freq: Every day | ORAL | 0 refills | Status: DC
  Filled 2022-06-06: qty 120, 30d supply, fill #0

## 2022-06-07 ENCOUNTER — Other Ambulatory Visit (HOSPITAL_COMMUNITY): Payer: Self-pay

## 2022-06-10 ENCOUNTER — Other Ambulatory Visit (HOSPITAL_COMMUNITY): Payer: Self-pay

## 2022-06-11 ENCOUNTER — Telehealth: Payer: Self-pay | Admitting: Genetic Counselor

## 2022-06-11 ENCOUNTER — Encounter: Payer: Self-pay | Admitting: Genetic Counselor

## 2022-06-11 ENCOUNTER — Ambulatory Visit: Payer: Self-pay | Admitting: Genetic Counselor

## 2022-06-11 DIAGNOSIS — C61 Malignant neoplasm of prostate: Secondary | ICD-10-CM

## 2022-06-11 DIAGNOSIS — Z803 Family history of malignant neoplasm of breast: Secondary | ICD-10-CM

## 2022-06-11 DIAGNOSIS — Z1379 Encounter for other screening for genetic and chromosomal anomalies: Secondary | ICD-10-CM | POA: Insufficient documentation

## 2022-06-11 NOTE — Telephone Encounter (Signed)
Revealed negative genetics with VUS in PMS2.

## 2022-06-11 NOTE — Progress Notes (Signed)
HPI:   Mr. Cullers was previously seen in the [place] clinic due to a personal history of metastatic prostate cancer and concerns regarding a hereditary predisposition to cancer. Please refer to our prior cancer genetics clinic note for more information regarding our discussion, assessment and recommendations, at the time. Mr. Arment recent genetic test results were disclosed to him, as were recommendations warranted by these results. These results and recommendations are discussed in more detail below.  CANCER HISTORY:  Oncology History  Prostate cancer metastatic to bone [facility])  08/23/2021 Initial Diagnosis   Prostate cancer metastatic to bone [facility])   09/07/2021 Cancer Staging   Staging form: Prostate, AJCC 8th Edition - Clinical: Stage IVB (cTX, cNX, pM1c) - Signed by Wyatt Portela, MD on 09/07/2021   06/05/2022 Genetic Testing   Negative genetics--[facility] CustomNext-Cancer +RNAinsight Panel.  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 Report date is 06/05/2022.    The CustomNext-Cancer+RNAinsight panel offered by [facility] includes sequencing and rearrangement analysis for the following 47 genes:  APC, ATM, AXIN2, BARD1, BMPR1A, BRCA1, BRCA2, BRIP1, CDH1, CDK4, CDKN2A, CHEK2, DICER1, EPCAM, GREM1, HOXB13, MEN1, MLH1, MSH2, MSH3, MSH6, MUTYH, NBN, NF1, NF2, NTHL1, PALB2, PMS2, POLD1, POLE, PTEN, RAD51C, RAD51D, RECQL, RET, SDHA, SDHAF2, SDHB, SDHC, SDHD, SMAD4, SMARCA4, STK11, TP53, TSC1, TSC2, and VHL.  RNA data is routinely analyzed for use in variant interpretation for all genes.     FAMILY HISTORY:  We obtained a detailed, 4-generation family history.  Significant diagnoses are listed below:      Family History  Problem Relation Age of Onset   Throat cancer Father          d. early 88s   Breast cancer Maternal Aunt          metastatic; x2 mat aunts     Mr. Cross is unaware of previous family history of genetic testing for hereditary cancer risks.  There is no reported Ashkenazi Jewish ancestry. There is no known consanguinity.    GENETIC TEST RESULTS:  The [facility] CustomNext-Cancer +RNAinsight Panel found no pathogenic mutations. The CustomNext-Cancer+RNAinsight panel offered by [facility] includes sequencing and rearrangement analysis for the following 47 genes:  APC, ATM, AXIN2, BARD1, BMPR1A, BRCA1, BRCA2, BRIP1, CDH1, CDK4, CDKN2A, CHEK2, DICER1, EPCAM, GREM1, HOXB13, MEN1, MLH1, MSH2, MSH3, MSH6, MUTYH, NBN, NF1, NF2, NTHL1, PALB2, PMS2, POLD1, POLE, PTEN, RAD51C, RAD51D, RECQL, RET, SDHA, SDHAF2, SDHB, SDHC, SDHD, SMAD4, SMARCA4, STK11, TP53, TSC1, TSC2, and VHL.  RNA data is routinely analyzed for use in variant interpretation for all genes.   The test report has been scanned into EPIC and is located under the Molecular Pathology section of the Results Review tab.  A portion of the result report is included below for reference. Genetic testing reported out on June 05, 2022.    Genetic testing identified a variant of uncertain significance (VUS) in the PMS2 gene called  p.F290V (c.868T>G).  At this time, it is unknown if this variant is associated with an increased risk for cancer or if it is benign, but most uncertain variants are reclassified to benign. It should not be used to make medical management decisions. With time, we suspect the laboratory will determine the significance of this variant, if any. If the laboratory reclassifies this variant, we will attempt to contact Mr. Pundt to discuss it further.   Even though a pathogenic variant was not identified, possible explanations for the cancer in the family may include: There may be no hereditary  risk for cancer in the family. The cancers in Mr. Wallenstein and/or his family may be sporadic/familial or due to other genetic and environmental factors. There may be a gene mutation in one of these genes that current testing methods cannot detect but that chance is small. There  could be another gene that has not yet been discovered, or that we have not yet tested, that is responsible for the cancer diagnoses in the family.  It is also possible there is a hereditary cause for the cancer in the family that Mr. Alcindor did not inherit.  Therefore, it is important to remain in touch with cancer genetics in the future so that we can continue to offer Mr. Jacuinde the most up to date genetic testing.     ADDITIONAL GENETIC TESTING:  We discussed with Mr. Solis that his genetic testing was fairly extensive.  If there are additional relevant genes identified to increase cancer risk that can be analyzed in the future, we would be happy to discuss and coordinate this testing at that time.      CANCER SCREENING RECOMMENDATIONS:  Mr. Brinkmeier test result is considered negative (normal).  This means that we have not identified a hereditary cause for his personal history of metastatic prostate cancer at this time.   An individual's cancer risk and medical management are not determined by genetic test results alone. Overall cancer risk assessment incorporates additional factors, including personal medical history, family history, and any available genetic information that may result in a personalized plan for cancer prevention and surveillance. Therefore, it is recommended he continue to follow the cancer management and screening guidelines provided by his oncology and primary healthcare provider.  RECOMMENDATIONS FOR FAMILY MEMBERS:   Since he did not inherit a identifiable mutation in a cancer predisposition gene included on this panel, his children could not have inherited a known mutation from him in one of these genes. Individuals in this family might be at some increased risk of developing cancer, over the general population risk, due to the family history of cancer.  Individuals in the family should notify their providers of the family history of cancer. We recommend women  in this family have a yearly mammogram beginning at age 31, or 77 years younger than the earliest onset of cancer, an annual clinical breast exam, and perform monthly breast self-exams.  Male relatives should speak to their providers about prostate cancer screening.  We do not recommend familial testing for the PMS2 variant of uncertain significance (VUS).  FOLLOW-UP:  Lastly, we discussed with Mr. Kube that cancer genetics is a rapidly advancing field and it is possible that new genetic tests will be appropriate for him and/or his family members in the future. We encouraged him to remain in contact with cancer genetics on an annual basis so we can update his personal and family histories and let him know of advances in cancer genetics that may benefit this family.   Our contact number was provided. Mr. Phillis questions were answered to his satisfaction, and he knows he is welcome to call us at anytime with additional questions or concerns.   Yaira Bernardi M. Joette Catching, [place], [facility] Genetic Counselor [email or web address] (P) [phone]

## 2022-07-02 ENCOUNTER — Other Ambulatory Visit: Payer: Self-pay | Admitting: Oncology

## 2022-07-02 ENCOUNTER — Other Ambulatory Visit (HOSPITAL_COMMUNITY): Payer: Self-pay

## 2022-07-02 DIAGNOSIS — C61 Malignant neoplasm of prostate: Secondary | ICD-10-CM

## 2022-07-02 MED ORDER — ABIRATERONE ACETATE 250 MG PO TABS
1000.0000 mg | ORAL_TABLET | Freq: Every day | ORAL | 0 refills | Status: DC
  Filled 2022-07-02: qty 120, 30d supply, fill #0

## 2022-07-08 ENCOUNTER — Other Ambulatory Visit (HOSPITAL_COMMUNITY): Payer: Self-pay

## 2022-07-09 ENCOUNTER — Other Ambulatory Visit (HOSPITAL_COMMUNITY): Payer: Self-pay

## 2022-07-12 ENCOUNTER — Other Ambulatory Visit (HOSPITAL_COMMUNITY): Payer: Self-pay

## 2022-07-16 ENCOUNTER — Other Ambulatory Visit: Payer: Self-pay

## 2022-07-16 ENCOUNTER — Inpatient Hospital Stay (HOSPITAL_BASED_OUTPATIENT_CLINIC_OR_DEPARTMENT_OTHER): Payer: BC Managed Care – PPO | Admitting: Oncology

## 2022-07-16 ENCOUNTER — Inpatient Hospital Stay: Payer: BC Managed Care – PPO | Attending: Oncology

## 2022-07-16 ENCOUNTER — Inpatient Hospital Stay: Payer: BC Managed Care – PPO

## 2022-07-16 VITALS — BP 146/88 | HR 77 | Temp 97.7°F | Resp 17 | Ht 67.0 in | Wt 188.4 lb

## 2022-07-16 DIAGNOSIS — C7951 Secondary malignant neoplasm of bone: Secondary | ICD-10-CM | POA: Insufficient documentation

## 2022-07-16 DIAGNOSIS — C61 Malignant neoplasm of prostate: Secondary | ICD-10-CM | POA: Insufficient documentation

## 2022-07-16 DIAGNOSIS — Z5111 Encounter for antineoplastic chemotherapy: Secondary | ICD-10-CM | POA: Diagnosis present

## 2022-07-16 DIAGNOSIS — I10 Essential (primary) hypertension: Secondary | ICD-10-CM | POA: Insufficient documentation

## 2022-07-16 LAB — CBC WITH DIFFERENTIAL (CANCER CENTER ONLY)
Abs Immature Granulocytes: 0.02 10*3/uL (ref 0.00–0.07)
Basophils Absolute: 0 10*3/uL (ref 0.0–0.1)
Basophils Relative: 0 %
Eosinophils Absolute: 0.1 10*3/uL (ref 0.0–0.5)
Eosinophils Relative: 2 %
HCT: 39.3 % (ref 39.0–52.0)
Hemoglobin: 13.9 g/dL (ref 13.0–17.0)
Immature Granulocytes: 0 %
Lymphocytes Relative: 31 %
Lymphs Abs: 1.7 10*3/uL (ref 0.7–4.0)
MCH: 31.8 pg (ref 26.0–34.0)
MCHC: 35.4 g/dL (ref 30.0–36.0)
MCV: 89.9 fL (ref 80.0–100.0)
Monocytes Absolute: 0.6 10*3/uL (ref 0.1–1.0)
Monocytes Relative: 12 %
Neutro Abs: 2.9 10*3/uL (ref 1.7–7.7)
Neutrophils Relative %: 55 %
Platelet Count: 183 10*3/uL (ref 150–400)
RBC: 4.37 MIL/uL (ref 4.22–5.81)
RDW: 12.2 % (ref 11.5–15.5)
WBC Count: 5.4 10*3/uL (ref 4.0–10.5)
nRBC: 0 % (ref 0.0–0.2)

## 2022-07-16 LAB — CMP (CANCER CENTER ONLY)
ALT: 31 U/L (ref 0–44)
AST: 29 U/L (ref 15–41)
Albumin: 4.3 g/dL (ref 3.5–5.0)
Alkaline Phosphatase: 77 U/L (ref 38–126)
Anion gap: 7 (ref 5–15)
BUN: 11 mg/dL (ref 6–20)
CO2: 28 mmol/L (ref 22–32)
Calcium: 9.6 mg/dL (ref 8.9–10.3)
Chloride: 110 mmol/L (ref 98–111)
Creatinine: 1 mg/dL (ref 0.61–1.24)
GFR, Estimated: >60 mL/min (ref >60)
Glucose, Bld: 114 mg/dL — ABNORMAL HIGH (ref 70–99)
Potassium: 3.9 mmol/L (ref 3.5–5.1)
Sodium: 145 mmol/L (ref 135–145)
Total Bilirubin: 0.7 mg/dL (ref 0.3–1.2)
Total Protein: 7.2 g/dL (ref 6.5–8.1)

## 2022-07-16 MED ORDER — LEUPROLIDE ACETATE (4 MONTH) 30 MG ~~LOC~~ KIT
30.0000 mg | PACK | Freq: Once | SUBCUTANEOUS | Status: AC
  Administered 2022-07-16: 30 mg via SUBCUTANEOUS
  Filled 2022-07-16: qty 30

## 2022-07-16 MED ORDER — DENOSUMAB 120 MG/1.7ML ~~LOC~~ SOLN
120.0000 mg | Freq: Once | SUBCUTANEOUS | Status: AC
  Administered 2022-07-16: 120 mg via SUBCUTANEOUS
  Filled 2022-07-16: qty 1.7

## 2022-07-16 NOTE — Progress Notes (Signed)
Hematology and Oncology Follow Up Visit  Clifford Jenkins [id number] [birth year] 61 y.o. 07/16/2022 10:19 AM Clifford Jenkins [name], [place], [name], [place]   Principle Diagnosis: 61 year old man with advanced prostate cancer with disease to the bone diagnosed in January 2023.  He has castration-sensitive presenting with a PSA of 674.   Prior Therapy:   He is status post T5-6 and T6-7 laminectomy and resection of his thoracic tumor completed by Dr. Arnoldo Morale on August 19, 2021.  At that time the final pathology showed metastatic carcinoma compatible with prostate origin.  He is status post radiation therapy to the thoracic spine with total of 30 Gray in 10 fractions completed in February 2023.  Androgen deprivation therapy started with Firmagon 240 mg on August 21, 2021.   Current therapy:   Eligard 30 mg every 4 months started on October 15, 2021.  He will receive next injection today.  Zytiga 1000 mg with prednisone 5 mg daily started on October 18, 2021.  Xgeva 120 mg every 8 weeks started on January 10, 2022.  Will receive next injection today.  Interim History: Mr. [name] returns today for repeat follow-up.  Since last visit, he reports no major changes in his health.  He denies any nausea, vomiting or abdominal pain.  He denies any Zytiga complications.  He denies any excessive fatigue, tiredness or weakness.     Medications: Updated on review. Current Outpatient Medications  Medication Sig Dispense Refill   abiraterone acetate (ZYTIGA) 250 MG tablet Take 4 tablets (1,000 mg total) by mouth daily. Take on an empty stomach 1 hour before or 2 hours after a meal 120 tablet 0   acetaminophen (TYLENOL) 325 MG tablet Take 2 tablets (650 mg total) by mouth every 4 (four) hours as needed for mild pain ((score 1 to 3) or temp > 100.5).     amLODipine (NORVASC) 10 MG tablet Take 1 tablet (10 mg total) by mouth daily. 30 tablet 0   calcium-vitamin D (OSCAL WITH D) 500-5 MG-MCG tablet Take 1 tablet by  mouth 2 (two) times daily. 60 tablet 3   cyclobenzaprine (FLEXERIL) 10 MG tablet Take 1 tablet (10 mg total) by mouth 3 (three) times daily as needed for muscle spasms. 60 tablet 0   docusate sodium (COLACE) 100 MG capsule Take 1 capsule (100 mg total) by mouth 2 (two) times daily. 10 capsule 0   methocarbamol (ROBAXIN) 500 MG tablet Take 1 tablet (500 mg total) by mouth 2 (two) times daily. 30 tablet 0   Oxycodone HCl 10 MG TABS Take 1 tablet (10 mg total) by mouth every 4 (four) hours as needed for moderate pain. 30 tablet 0   polyethylene glycol (MIRALAX / GLYCOLAX) 17 g packet Take 17 g by mouth daily. 14 each 0   predniSONE (DELTASONE) 5 MG tablet TAKE 1 TABLET BY MOUTH EVERY DAY WITH BREAKFAST 90 tablet 1   sucralfate (CARAFATE) 1 g tablet Take 1 tablet (1 g total) by mouth 4 (four) times daily -  with meals and at bedtime. 5 min before meals for radiation induced esophagitis 60 tablet 2   valsartan (DIOVAN) 160 MG tablet Take 1 tablet (160 mg total) by mouth daily. 30 tablet 0   No current facility-administered medications for this visit.     Allergies: No Known Allergies    Physical Exam:    Blood pressure (!) 146/88, pulse 77, temperature 97.7 F (36.5 C), temperature source Temporal, resp. rate 17, height '5\' 7"'$  (1.702 m), weight  188 lb 6.4 oz (85.5 kg), SpO2 100 %.    ECOG: 1   General appearance: Alert, awake without any distress. Head: Atraumatic without abnormalities Oropharynx: Without any thrush or ulcers. Eyes: No scleral icterus. Lymph nodes: No lymphadenopathy noted in the cervical, supraclavicular, or axillary nodes Heart:regular rate and rhythm, without any murmurs or gallops.   Lung: Clear to auscultation without any rhonchi, wheezes or dullness to percussion. Abdomin: Soft, nontender without any shifting dullness or ascites. Musculoskeletal: No clubbing or cyanosis. Neurological: No motor or sensory deficits. Skin: No rashes or  lesions.         Lab Results: Lab Results  Component Value Date   WBC 4.5 05/14/2022   HGB 14.0 05/14/2022   HCT 39.1 05/14/2022   MCV 87.1 05/14/2022   PLT 163 05/14/2022   PSA 0.30 07/26/2012     Chemistry      Component Value Date/Time   NA 141 05/14/2022 0949   K 3.6 05/14/2022 0949   CL 109 05/14/2022 0949   CO2 26 05/14/2022 0949   BUN 15 05/14/2022 0949   CREATININE 0.95 05/14/2022 0949      Component Value Date/Time   CALCIUM 9.0 05/14/2022 0949   ALKPHOS 60 05/14/2022 0949   AST 37 05/14/2022 0949   ALT 48 (H) 05/14/2022 0949   BILITOT 0.8 05/14/2022 0949       Latest Reference Range & Units 08/20/21 14:25 10/15/21 08:16 11/27/21 15:08 01/10/22 12:39 03/14/22 08:29 05/14/22 09:49  Prostate Specific Ag, Serum 0.0 - 4.0 ng/mL  38.3 (H) 0.3 <0.1 <0.1 <0.1  Prostatic Specific Antigen 0.00 - 4.00 ng/mL 674.00 (H)       (H): Data is abnormally high   Impression and Plan:   61 year old man with:  1.  Castration-sensitive advanced prostate cancer with disease to the bone and lymphadenopathy diagnosed in January 2023.    He continues to have excellent response with a PSA that is currently undetectable to the current treatment.  Risks and benefits of continuing Zytiga long-term were discussed.  Complications that include hypertension, edema as well as adrenal sufficiency were reiterated.  He is agreeable to continuing different salvage therapy options will be deferred he has relapsed disease including a PARP inhibitor or Taxotere chemotherapy.  2.  Thoracic spine cord compression: No residual complications noted after surgical resection.   3.  Androgen deprivation therapy: He will receive Eligard today and repeated in 4 months.  Complications including osteoporosis and sexual dysfunction among others were reiterated.   4.  Bone directed therapy: Complications related to Xgeva including osteonecrosis of the jaw and hypocalcemia were reiterated.  He will receive  Xgeva today and repeated in 2 months.  5.  Hypertension: Blood pressure is mildly elevated I will continue to monitor on Zytiga.     7.  Follow-up: In 2 months for repeat follow-up.     30  minutes were direct added to this visit.  The time was spent on updating disease status, treatment choices and addressing complications related to cancer and cancer therapy.       Zola Button, MD 12/5/202310:19 AM On [phone]

## 2022-07-18 LAB — PROSTATE-SPECIFIC AG, SERUM (LABCORP): Prostate Specific Ag, Serum: <0.1 ng/mL (ref 0.0–4.0)

## 2022-07-19 ENCOUNTER — Telehealth: Payer: Self-pay | Admitting: *Deleted

## 2022-07-19 NOTE — Telephone Encounter (Signed)
-----   Message from Wyatt Portela, MD sent at 07/18/2022  9:14 AM EST ----- Please let him know his PSA is down

## 2022-07-19 NOTE — Telephone Encounter (Signed)
PC to patient, informed him of PSA results, he verbalizes understanding. 

## 2022-07-25 ENCOUNTER — Other Ambulatory Visit (HOSPITAL_COMMUNITY): Payer: Self-pay

## 2022-07-30 ENCOUNTER — Other Ambulatory Visit (HOSPITAL_COMMUNITY): Payer: Self-pay

## 2022-07-30 ENCOUNTER — Other Ambulatory Visit: Payer: Self-pay

## 2022-08-16 ENCOUNTER — Other Ambulatory Visit (HOSPITAL_COMMUNITY): Payer: Self-pay

## 2022-08-16 ENCOUNTER — Other Ambulatory Visit: Payer: Self-pay | Admitting: Oncology

## 2022-08-16 DIAGNOSIS — C61 Malignant neoplasm of prostate: Secondary | ICD-10-CM

## 2022-08-16 MED ORDER — ABIRATERONE ACETATE 250 MG PO TABS
1000.0000 mg | ORAL_TABLET | Freq: Every day | ORAL | 0 refills | Status: DC
  Filled 2022-08-16: qty 120, 30d supply, fill #0

## 2022-08-19 ENCOUNTER — Other Ambulatory Visit (HOSPITAL_COMMUNITY): Payer: Self-pay

## 2022-09-06 ENCOUNTER — Other Ambulatory Visit (HOSPITAL_COMMUNITY): Payer: Self-pay

## 2022-09-09 ENCOUNTER — Other Ambulatory Visit (HOSPITAL_COMMUNITY): Payer: Self-pay

## 2022-09-10 ENCOUNTER — Other Ambulatory Visit (HOSPITAL_COMMUNITY): Payer: Self-pay

## 2022-09-11 ENCOUNTER — Other Ambulatory Visit (HOSPITAL_COMMUNITY): Payer: Self-pay

## 2022-09-11 ENCOUNTER — Encounter: Payer: Self-pay | Admitting: Oncology

## 2022-09-11 ENCOUNTER — Telehealth: Payer: Self-pay | Admitting: Pharmacy Technician

## 2022-09-11 ENCOUNTER — Other Ambulatory Visit: Payer: Self-pay | Admitting: Oncology

## 2022-09-11 DIAGNOSIS — C61 Malignant neoplasm of prostate: Secondary | ICD-10-CM

## 2022-09-11 MED ORDER — ABIRATERONE ACETATE 250 MG PO TABS
1000.0000 mg | ORAL_TABLET | Freq: Every day | ORAL | 0 refills | Status: DC
  Filled 2022-09-11: qty 120, 30d supply, fill #0

## 2022-09-11 NOTE — Telephone Encounter (Addendum)
Oral Oncology Patient Advocate Encounter   Received notification that prior authorization for Abiraterone is required.   PA submitted on 09/11/22 Case ID: [id number] [employer] phone [phone]  Status is pending     Lady Deutscher, CPhT-Adv Oncology Pharmacy Patient [place] Direct Number: [phone]  Fax: [phone]

## 2022-09-12 ENCOUNTER — Other Ambulatory Visit (HOSPITAL_COMMUNITY): Payer: Self-pay

## 2022-09-13 ENCOUNTER — Other Ambulatory Visit: Payer: Self-pay

## 2022-09-13 ENCOUNTER — Encounter: Payer: Self-pay | Admitting: Oncology

## 2022-09-13 ENCOUNTER — Other Ambulatory Visit (HOSPITAL_COMMUNITY): Payer: Self-pay

## 2022-09-13 NOTE — Telephone Encounter (Signed)
Oral Oncology Patient Advocate Encounter  Prior Authorization for Abiraterone has been approved.    PA# [id number] Effective dates: 09/13/22 through 09/13/23  Patients co-pay is $950.    Lady Deutscher, CPhT-Adv Oncology Pharmacy Patient [place] Direct Number: [phone]  Fax: [phone]

## 2022-09-15 NOTE — Assessment & Plan Note (Addendum)
-  stage IV with bone metastasis  -diagnosed in 08/2021, initial PSA 674. -He initially had T5-6, and a T6-7 laminectomy and resection of his thoracic tumor on August 19, 2021, status post radiation to thoracic spine in February 2023. -He has castration sensitive disease, on androgen deprivation therapy with Eligard '30mg'$  every 4 months and Zytiga or 1000 mg with prednisone 5 mg daily since March 2023 -He is also on Xgeva 120 mg every 8 weeks -He has had excellent response to treatment, PSA is undetectable now. -He is tolerating treatment well overall.  We again reviewed the potential side effects from above treatment, and management.  We also discussed future treatment options if he had disease progression.

## 2022-09-16 ENCOUNTER — Other Ambulatory Visit: Payer: Self-pay

## 2022-09-16 ENCOUNTER — Encounter: Payer: Self-pay | Admitting: Hematology

## 2022-09-16 ENCOUNTER — Inpatient Hospital Stay (HOSPITAL_BASED_OUTPATIENT_CLINIC_OR_DEPARTMENT_OTHER): Payer: 59 | Admitting: Hematology

## 2022-09-16 ENCOUNTER — Inpatient Hospital Stay: Payer: 59 | Attending: Oncology

## 2022-09-16 ENCOUNTER — Inpatient Hospital Stay: Payer: 59

## 2022-09-16 ENCOUNTER — Other Ambulatory Visit: Payer: Self-pay | Admitting: Hematology

## 2022-09-16 VITALS — BP 142/94 | HR 73 | Temp 99.1°F | Resp 15 | Ht 67.0 in | Wt 191.0 lb

## 2022-09-16 DIAGNOSIS — C61 Malignant neoplasm of prostate: Secondary | ICD-10-CM | POA: Diagnosis present

## 2022-09-16 DIAGNOSIS — C7951 Secondary malignant neoplasm of bone: Secondary | ICD-10-CM

## 2022-09-16 DIAGNOSIS — I10 Essential (primary) hypertension: Secondary | ICD-10-CM | POA: Insufficient documentation

## 2022-09-16 DIAGNOSIS — Z7952 Long term (current) use of systemic steroids: Secondary | ICD-10-CM | POA: Diagnosis not present

## 2022-09-16 LAB — CMP (CANCER CENTER ONLY)
ALT: 25 U/L (ref 0–44)
AST: 24 U/L (ref 15–41)
Albumin: 4.2 g/dL (ref 3.5–5.0)
Alkaline Phosphatase: 64 U/L (ref 38–126)
Anion gap: 7 (ref 5–15)
BUN: 15 mg/dL (ref 6–20)
CO2: 24 mmol/L (ref 22–32)
Calcium: 9 mg/dL (ref 8.9–10.3)
Chloride: 111 mmol/L (ref 98–111)
Creatinine: 0.94 mg/dL (ref 0.61–1.24)
GFR, Estimated: >60 mL/min (ref >60)
Glucose, Bld: 88 mg/dL (ref 70–99)
Potassium: 4 mmol/L (ref 3.5–5.1)
Sodium: 142 mmol/L (ref 135–145)
Total Bilirubin: 0.6 mg/dL (ref 0.3–1.2)
Total Protein: 6.8 g/dL (ref 6.5–8.1)

## 2022-09-16 LAB — CBC WITH DIFFERENTIAL (CANCER CENTER ONLY)
Abs Immature Granulocytes: 0.03 10*3/uL (ref 0.00–0.07)
Basophils Absolute: 0 10*3/uL (ref 0.0–0.1)
Basophils Relative: 0 %
Eosinophils Absolute: 0.1 10*3/uL (ref 0.0–0.5)
Eosinophils Relative: 1 %
HCT: 40.6 % (ref 39.0–52.0)
Hemoglobin: 14.3 g/dL (ref 13.0–17.0)
Immature Granulocytes: 1 %
Lymphocytes Relative: 34 %
Lymphs Abs: 1.7 10*3/uL (ref 0.7–4.0)
MCH: 31.2 pg (ref 26.0–34.0)
MCHC: 35.2 g/dL (ref 30.0–36.0)
MCV: 88.5 fL (ref 80.0–100.0)
Monocytes Absolute: 0.6 10*3/uL (ref 0.1–1.0)
Monocytes Relative: 11 %
Neutro Abs: 2.7 10*3/uL (ref 1.7–7.7)
Neutrophils Relative %: 53 %
Platelet Count: 161 10*3/uL (ref 150–400)
RBC: 4.59 MIL/uL (ref 4.22–5.81)
RDW: 11.9 % (ref 11.5–15.5)
WBC Count: 5.1 10*3/uL (ref 4.0–10.5)
nRBC: 0 % (ref 0.0–0.2)

## 2022-09-16 MED ORDER — DENOSUMAB 120 MG/1.7ML ~~LOC~~ SOLN
120.0000 mg | Freq: Once | SUBCUTANEOUS | Status: AC
  Administered 2022-09-16: 120 mg via SUBCUTANEOUS
  Filled 2022-09-16: qty 1.7
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
## 2022-09-16 NOTE — Patient Instructions (Signed)
Denosumab Injection (Oncology) What is this medication? DENOSUMAB (den oh SUE mab) prevents weakened bones caused by cancer. It may also be used to treat noncancerous bone tumors that cannot be removed by surgery. It can also be used to treat high calcium levels in the blood caused by cancer. It works by blocking a protein that causes bones to break down quickly. This slows down the release of calcium from bones, which lowers calcium levels in your blood. It also makes your bones stronger and less likely to break (fracture). This medicine may be used for other purposes; ask your health care provider or pharmacist if you have questions. COMMON BRAND NAME(S): XGEVA What should I tell my care team before I take this medication? They need to know if you have any of these conditions: Dental disease Having surgery or tooth extraction Infection Kidney disease Low levels of calcium or vitamin D in the blood Malnutrition On hemodialysis Skin conditions or sensitivity Thyroid or parathyroid disease An unusual reaction to denosumab, other medications, foods, dyes, or preservatives Pregnant or trying to get pregnant Breast-feeding How should I use this medication? This medication is for injection under the skin. It is given by your care team in a hospital or clinic setting. A special MedGuide will be given to you before each treatment. Be sure to read this information carefully each time. Talk to your care team about the use of this medication in children. While it may be prescribed for children as young as 13 years for selected conditions, precautions do apply. Overdosage: If you think you have taken too much of this medicine contact a poison control center or emergency room at once. NOTE: This medicine is only for you. Do not share this medicine with others. What if I miss a dose? Keep appointments for follow-up doses. It is important not to miss your dose. Call your care team if you are unable to  keep an appointment. What may interact with this medication? Do not take this medication with any of the following: Other medications containing denosumab This medication may also interact with the following: Medications that lower your chance of fighting infection Steroid medications, such as prednisone or cortisone This list may not describe all possible interactions. Give your health care provider a list of all the medicines, herbs, non-prescription drugs, or dietary supplements you use. Also tell them if you smoke, drink alcohol, or use illegal drugs. Some items may interact with your medicine. What should I watch for while using this medication? Your condition will be monitored carefully while you are receiving this medication. You may need blood work while taking this medication. This medication may increase your risk of getting an infection. Call your care team for advice if you get a fever, chills, sore throat, or other symptoms of a cold or flu. Do not treat yourself. Try to avoid being around people who are sick. You should make sure you get enough calcium and vitamin D while you are taking this medication, unless your care team tells you not to. Discuss the foods you eat and the vitamins you take with your care team. Some people who take this medication have severe bone, joint, or muscle pain. This medication may also increase your risk for jaw problems or a broken thigh bone. Tell your care team right away if you have severe pain in your jaw, bones, joints, or muscles. Tell your care team if you have any pain that does not go away or that gets worse. Talk   to your care team if you may be pregnant. Serious birth defects can occur if you take this medication during pregnancy and for 5 months after the last dose. You will need a negative pregnancy test before starting this medication. Contraception is recommended while taking this medication and for 5 months after the last dose. Your care team  can help you find the option that works for you. What side effects may I notice from receiving this medication? Side effects that you should report to your care team as soon as possible: Allergic reactions--skin rash, itching, hives, swelling of the face, lips, tongue, or throat Bone, joint, or muscle pain Low calcium level--muscle pain or cramps, confusion, tingling, or numbness in the hands or feet Osteonecrosis of the jaw--pain, swelling, or redness in the mouth, numbness of the jaw, poor healing after dental work, unusual discharge from the mouth, visible bones in the mouth Side effects that usually do not require medical attention (report to your care team if they continue or are bothersome): Cough Diarrhea Fatigue Headache Nausea This list may not describe all possible side effects. Call your doctor for medical advice about side effects. You may report side effects to FDA at 1-800-FDA-1088. Where should I keep my medication? This medication is given in a hospital or clinic. It will not be stored at home. NOTE: This sheet is a summary. It may not cover all possible information. If you have questions about this medicine, talk to your doctor, pharmacist, or health care provider.  2023 Elsevier/Gold Standard (2021-12-17 00:00:00)  

## 2022-09-17 ENCOUNTER — Telehealth: Payer: Self-pay | Admitting: Hematology

## 2022-09-17 ENCOUNTER — Other Ambulatory Visit: Payer: Self-pay

## 2022-09-17 ENCOUNTER — Other Ambulatory Visit (HOSPITAL_COMMUNITY): Payer: Self-pay

## 2022-09-17 LAB — PROSTATE-SPECIFIC AG, SERUM (LABCORP): Prostate Specific Ag, Serum: <0.1 ng/mL (ref 0.0–4.0)

## 2022-09-17 NOTE — Telephone Encounter (Signed)
Contacted patient to scheduled appointments. Left message with appointment details and a call back number if patient had any questions or could not accommodate the time we provided.   

## 2022-09-18 ENCOUNTER — Other Ambulatory Visit (HOSPITAL_COMMUNITY): Payer: Self-pay

## 2022-09-19 ENCOUNTER — Other Ambulatory Visit (HOSPITAL_COMMUNITY): Payer: Self-pay

## 2022-10-07 ENCOUNTER — Other Ambulatory Visit: Payer: Self-pay

## 2022-10-10 ENCOUNTER — Other Ambulatory Visit: Payer: Self-pay | Admitting: Nurse Practitioner

## 2022-10-10 ENCOUNTER — Other Ambulatory Visit (HOSPITAL_COMMUNITY): Payer: Self-pay

## 2022-10-10 ENCOUNTER — Other Ambulatory Visit: Payer: Self-pay

## 2022-10-10 DIAGNOSIS — C61 Malignant neoplasm of prostate: Secondary | ICD-10-CM

## 2022-10-10 MED ORDER — ABIRATERONE ACETATE 250 MG PO TABS
1000.0000 mg | ORAL_TABLET | Freq: Every day | ORAL | 0 refills | Status: DC
  Filled 2022-10-10: qty 120, 30d supply, fill #0

## 2022-10-14 ENCOUNTER — Other Ambulatory Visit (HOSPITAL_COMMUNITY): Payer: Self-pay

## 2022-10-16 ENCOUNTER — Other Ambulatory Visit (HOSPITAL_COMMUNITY): Payer: Self-pay

## 2022-10-17 ENCOUNTER — Other Ambulatory Visit: Payer: Self-pay

## 2022-11-05 ENCOUNTER — Encounter: Payer: Self-pay | Admitting: Hematology

## 2022-11-07 ENCOUNTER — Other Ambulatory Visit (HOSPITAL_COMMUNITY): Payer: Self-pay

## 2022-11-11 ENCOUNTER — Other Ambulatory Visit (HOSPITAL_COMMUNITY): Payer: Self-pay

## 2022-11-11 ENCOUNTER — Other Ambulatory Visit: Payer: Self-pay | Admitting: Nurse Practitioner

## 2022-11-11 ENCOUNTER — Other Ambulatory Visit: Payer: Self-pay

## 2022-11-11 DIAGNOSIS — C61 Malignant neoplasm of prostate: Secondary | ICD-10-CM

## 2022-11-11 MED ORDER — ABIRATERONE ACETATE 250 MG PO TABS
1000.0000 mg | ORAL_TABLET | Freq: Every day | ORAL | 0 refills | Status: DC
  Filled 2022-11-11: qty 120, 30d supply, fill #0

## 2022-11-13 ENCOUNTER — Other Ambulatory Visit: Payer: Self-pay

## 2022-11-13 ENCOUNTER — Encounter: Payer: Self-pay | Admitting: Hematology

## 2022-11-14 ENCOUNTER — Other Ambulatory Visit (HOSPITAL_COMMUNITY): Payer: Self-pay

## 2022-11-15 ENCOUNTER — Other Ambulatory Visit (HOSPITAL_COMMUNITY): Payer: Self-pay

## 2022-11-15 ENCOUNTER — Other Ambulatory Visit: Payer: Self-pay

## 2022-11-17 NOTE — Assessment & Plan Note (Signed)
stage IV with bone metastasis  -diagnosed in 08/2021, initial PSA 674. -He initially had T5-6, and a T6-7 laminectomy and resection of his thoracic tumor on August 19, 2021, status post radiation to thoracic spine in February 2023. -He has castration sensitive disease, on androgen deprivation therapy with Eligard 30mg  every 4 months and Zytiga or 1000 mg with prednisone 5 mg daily since March 2023 -He is also on Xgeva 120 mg every 8 weeks -He has had excellent response to treatment, PSA is undetectable now. -He is tolerating treatment well overall.   -He is clinically doing well, with minimal pain in back, otherwise doing very well. -Lab reviewed.  Will proceed Xgeva injection today - Will likely change Xgeva to every 3 months, and Eligard every 6 months in future

## 2022-11-18 ENCOUNTER — Inpatient Hospital Stay (HOSPITAL_BASED_OUTPATIENT_CLINIC_OR_DEPARTMENT_OTHER): Payer: 59 | Admitting: Hematology

## 2022-11-18 ENCOUNTER — Other Ambulatory Visit: Payer: Self-pay

## 2022-11-18 ENCOUNTER — Inpatient Hospital Stay: Payer: 59

## 2022-11-18 ENCOUNTER — Encounter: Payer: Self-pay | Admitting: Hematology

## 2022-11-18 ENCOUNTER — Inpatient Hospital Stay: Payer: 59 | Attending: Oncology

## 2022-11-18 VITALS — BP 137/89 | HR 75 | Temp 98.9°F | Resp 15 | Ht 67.0 in | Wt 189.8 lb

## 2022-11-18 DIAGNOSIS — C7951 Secondary malignant neoplasm of bone: Secondary | ICD-10-CM

## 2022-11-18 DIAGNOSIS — C61 Malignant neoplasm of prostate: Secondary | ICD-10-CM | POA: Diagnosis present

## 2022-11-18 DIAGNOSIS — Z5111 Encounter for antineoplastic chemotherapy: Secondary | ICD-10-CM | POA: Insufficient documentation

## 2022-11-18 DIAGNOSIS — I10 Essential (primary) hypertension: Secondary | ICD-10-CM | POA: Diagnosis not present

## 2022-11-18 DIAGNOSIS — R1013 Epigastric pain: Secondary | ICD-10-CM | POA: Insufficient documentation

## 2022-11-18 LAB — CMP (CANCER CENTER ONLY)
ALT: 25 U/L (ref 0–44)
AST: 26 U/L (ref 15–41)
Albumin: 4.6 g/dL (ref 3.5–5.0)
Alkaline Phosphatase: 73 U/L (ref 38–126)
Anion gap: 8 (ref 5–15)
BUN: 20 mg/dL (ref 6–20)
CO2: 31 mmol/L (ref 22–32)
Calcium: 10.7 mg/dL — ABNORMAL HIGH (ref 8.9–10.3)
Chloride: 104 mmol/L (ref 98–111)
Creatinine: 1.18 mg/dL (ref 0.61–1.24)
GFR, Estimated: >60 mL/min (ref >60)
Glucose, Bld: 113 mg/dL — ABNORMAL HIGH (ref 70–99)
Potassium: 3.6 mmol/L (ref 3.5–5.1)
Sodium: 143 mmol/L (ref 135–145)
Total Bilirubin: 0.8 mg/dL (ref 0.3–1.2)
Total Protein: 7.6 g/dL (ref 6.5–8.1)

## 2022-11-18 LAB — CBC WITH DIFFERENTIAL (CANCER CENTER ONLY)
Abs Immature Granulocytes: 0.01 10*3/uL (ref 0.00–0.07)
Basophils Absolute: 0 10*3/uL (ref 0.0–0.1)
Basophils Relative: 0 %
Eosinophils Absolute: 0.1 10*3/uL (ref 0.0–0.5)
Eosinophils Relative: 2 %
HCT: 43 % (ref 39.0–52.0)
Hemoglobin: 15.1 g/dL (ref 13.0–17.0)
Immature Granulocytes: 0 %
Lymphocytes Relative: 39 %
Lymphs Abs: 2 10*3/uL (ref 0.7–4.0)
MCH: 31.5 pg (ref 26.0–34.0)
MCHC: 35.1 g/dL (ref 30.0–36.0)
MCV: 89.8 fL (ref 80.0–100.0)
Monocytes Absolute: 0.5 10*3/uL (ref 0.1–1.0)
Monocytes Relative: 11 %
Neutro Abs: 2.4 10*3/uL (ref 1.7–7.7)
Neutrophils Relative %: 48 %
Platelet Count: 185 10*3/uL (ref 150–400)
RBC: 4.79 MIL/uL (ref 4.22–5.81)
RDW: 12 % (ref 11.5–15.5)
WBC Count: 5.1 10*3/uL (ref 4.0–10.5)
nRBC: 0 % (ref 0.0–0.2)

## 2022-11-18 MED ORDER — DENOSUMAB 120 MG/1.7ML ~~LOC~~ SOLN
120.0000 mg | Freq: Once | SUBCUTANEOUS | Status: AC
  Administered 2022-11-18: 120 mg via SUBCUTANEOUS
  Filled 2022-11-18: qty 1.7

## 2022-11-18 MED ORDER — LEUPROLIDE ACETATE (4 MONTH) 30 MG ~~LOC~~ KIT
30.0000 mg | PACK | Freq: Once | SUBCUTANEOUS | Status: AC
  Administered 2022-11-18: 30 mg via SUBCUTANEOUS
  Filled 2022-11-18: qty 30

## 2022-11-18 NOTE — Addendum Note (Signed)
Addended by: Malachy Mood on: 11/18/2022 12:41 PM   Modules accepted: Orders
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
## 2022-11-19 LAB — PROSTATE-SPECIFIC AG, SERUM (LABCORP): Prostate Specific Ag, Serum: <0.1 ng/mL (ref 0.0–4.0)

## 2022-11-20 ENCOUNTER — Other Ambulatory Visit (HOSPITAL_COMMUNITY): Payer: Self-pay

## 2022-11-28 ENCOUNTER — Encounter: Payer: Self-pay | Admitting: Hematology

## 2022-12-05 ENCOUNTER — Ambulatory Visit: Payer: 59 | Admitting: Cardiology

## 2022-12-05 ENCOUNTER — Encounter: Payer: Self-pay | Admitting: Cardiology

## 2022-12-05 ENCOUNTER — Encounter: Payer: Self-pay | Admitting: Hematology

## 2022-12-05 VITALS — BP 140/88 | HR 92 | Ht 67.0 in | Wt 189.4 lb

## 2022-12-05 DIAGNOSIS — E782 Mixed hyperlipidemia: Secondary | ICD-10-CM

## 2022-12-05 DIAGNOSIS — I2510 Atherosclerotic heart disease of native coronary artery without angina pectoris: Secondary | ICD-10-CM

## 2022-12-05 DIAGNOSIS — I700 Atherosclerosis of aorta: Secondary | ICD-10-CM

## 2022-12-05 DIAGNOSIS — I10 Essential (primary) hypertension: Secondary | ICD-10-CM

## 2022-12-05 MED ORDER — ATORVASTATIN CALCIUM 40 MG PO TABS: 40.0000 mg | ORAL_TABLET | Freq: Every day | ORAL | 1 refills | Status: AC
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
## 2022-12-10 ENCOUNTER — Other Ambulatory Visit: Payer: Self-pay | Admitting: Hematology

## 2022-12-10 ENCOUNTER — Other Ambulatory Visit (HOSPITAL_COMMUNITY): Payer: Self-pay

## 2022-12-10 ENCOUNTER — Other Ambulatory Visit: Payer: Self-pay

## 2022-12-10 DIAGNOSIS — C7951 Secondary malignant neoplasm of bone: Secondary | ICD-10-CM

## 2022-12-10 MED ORDER — ABIRATERONE ACETATE 250 MG PO TABS
1000.0000 mg | ORAL_TABLET | Freq: Every day | ORAL | 0 refills | Status: DC
Start: 2022-12-10 — End: 2023-01-07
  Filled 2022-12-10 – 2022-12-16 (×2): qty 120, 30d supply, fill #0

## 2022-12-16 ENCOUNTER — Other Ambulatory Visit (HOSPITAL_COMMUNITY): Payer: Self-pay

## 2022-12-16 ENCOUNTER — Other Ambulatory Visit: Payer: Self-pay

## 2022-12-16 ENCOUNTER — Encounter: Payer: Self-pay | Admitting: Hematology

## 2023-01-03 ENCOUNTER — Other Ambulatory Visit: Payer: Self-pay

## 2023-01-06 ENCOUNTER — Encounter: Payer: Self-pay | Admitting: Hematology

## 2023-01-07 ENCOUNTER — Other Ambulatory Visit: Payer: Self-pay | Admitting: Hematology

## 2023-01-07 ENCOUNTER — Other Ambulatory Visit (HOSPITAL_COMMUNITY): Payer: Self-pay

## 2023-01-07 ENCOUNTER — Other Ambulatory Visit: Payer: Self-pay

## 2023-01-07 DIAGNOSIS — C61 Malignant neoplasm of prostate: Secondary | ICD-10-CM

## 2023-01-07 MED ORDER — ABIRATERONE ACETATE 250 MG PO TABS
1000.0000 mg | ORAL_TABLET | Freq: Every day | ORAL | 0 refills | Status: DC
Start: 2023-01-07 — End: 2023-01-28
  Filled 2023-01-07 – 2023-01-09 (×2): qty 120, 30d supply, fill #0

## 2023-01-09 ENCOUNTER — Other Ambulatory Visit: Payer: Self-pay

## 2023-01-09 ENCOUNTER — Other Ambulatory Visit (HOSPITAL_COMMUNITY): Payer: Self-pay

## 2023-01-12 NOTE — Assessment & Plan Note (Signed)
stage IV with bone metastasis  -diagnosed in 08/2021, initial PSA 674. -He initially had T5-6, and a T6-7 laminectomy and resection of his thoracic tumor on August 19, 2021, status post radiation to thoracic spine in February 2023. -He has castration sensitive disease, on androgen deprivation therapy with Eligard 30mg  every 4 months and Zytiga or 1000 mg with prednisone 5 mg daily since March 2023 -He is also on Xgeva 120 mg every 8 weeks -He has had excellent response to treatment, PSA is undetectable now. -He is tolerating treatment well overall.   -He is clinically doing well, with minimal pain in back, otherwise doing very well. -Lab reviewed.  Will proceed Xgeva injection today -Per patient's preference, we will continue follow-up every 2 months, Eligard every 4 months

## 2023-01-13 ENCOUNTER — Inpatient Hospital Stay: Payer: 59 | Attending: Oncology

## 2023-01-13 ENCOUNTER — Other Ambulatory Visit: Payer: Self-pay

## 2023-01-13 ENCOUNTER — Inpatient Hospital Stay: Payer: 59

## 2023-01-13 ENCOUNTER — Inpatient Hospital Stay (HOSPITAL_BASED_OUTPATIENT_CLINIC_OR_DEPARTMENT_OTHER): Payer: 59 | Admitting: Hematology

## 2023-01-13 ENCOUNTER — Encounter: Payer: Self-pay | Admitting: Hematology

## 2023-01-13 VITALS — BP 131/85 | HR 66 | Temp 98.6°F | Resp 18 | Ht 67.0 in | Wt 190.2 lb

## 2023-01-13 DIAGNOSIS — C7951 Secondary malignant neoplasm of bone: Secondary | ICD-10-CM | POA: Insufficient documentation

## 2023-01-13 DIAGNOSIS — C61 Malignant neoplasm of prostate: Secondary | ICD-10-CM | POA: Diagnosis not present

## 2023-01-13 LAB — CMP (CANCER CENTER ONLY)
ALT: 27 U/L (ref 0–44)
AST: 34 U/L (ref 15–41)
Albumin: 4.3 g/dL (ref 3.5–5.0)
Alkaline Phosphatase: 61 U/L (ref 38–126)
Anion gap: 7 (ref 5–15)
BUN: 15 mg/dL (ref 6–20)
CO2: 30 mmol/L (ref 22–32)
Calcium: 8.9 mg/dL (ref 8.9–10.3)
Chloride: 105 mmol/L (ref 98–111)
Creatinine: 1.1 mg/dL (ref 0.61–1.24)
GFR, Estimated: >60 mL/min (ref >60)
Glucose, Bld: 110 mg/dL — ABNORMAL HIGH (ref 70–99)
Potassium: 3.2 mmol/L — ABNORMAL LOW (ref 3.5–5.1)
Sodium: 142 mmol/L (ref 135–145)
Total Bilirubin: 1 mg/dL (ref 0.3–1.2)
Total Protein: 7.2 g/dL (ref 6.5–8.1)

## 2023-01-13 LAB — CBC WITH DIFFERENTIAL (CANCER CENTER ONLY)
Abs Immature Granulocytes: 0.01 10*3/uL (ref 0.00–0.07)
Basophils Absolute: 0 10*3/uL (ref 0.0–0.1)
Basophils Relative: 0 %
Eosinophils Absolute: 0.1 10*3/uL (ref 0.0–0.5)
Eosinophils Relative: 2 %
HCT: 38.9 % — ABNORMAL LOW (ref 39.0–52.0)
Hemoglobin: 13.7 g/dL (ref 13.0–17.0)
Immature Granulocytes: 0 %
Lymphocytes Relative: 36 %
Lymphs Abs: 1.7 10*3/uL (ref 0.7–4.0)
MCH: 31.1 pg (ref 26.0–34.0)
MCHC: 35.2 g/dL (ref 30.0–36.0)
MCV: 88.4 fL (ref 80.0–100.0)
Monocytes Absolute: 0.5 10*3/uL (ref 0.1–1.0)
Monocytes Relative: 12 %
Neutro Abs: 2.3 10*3/uL (ref 1.7–7.7)
Neutrophils Relative %: 50 %
Platelet Count: 177 10*3/uL (ref 150–400)
RBC: 4.4 MIL/uL (ref 4.22–5.81)
RDW: 12.1 % (ref 11.5–15.5)
WBC Count: 4.6 10*3/uL (ref 4.0–10.5)
nRBC: 0 % (ref 0.0–0.2)

## 2023-01-13 MED ORDER — PREDNISONE 5 MG PO TABS: ORAL_TABLET | ORAL | 1 refills | Status: DC

## 2023-01-13 MED ORDER — DENOSUMAB 120 MG/1.7ML ~~LOC~~ SOLN
120.0000 mg | Freq: Once | SUBCUTANEOUS | Status: AC
  Administered 2023-01-13: 120 mg via SUBCUTANEOUS
  Filled 2023-01-13: qty 1.7

## 2023-01-13 MED ORDER — POTASSIUM CHLORIDE CRYS ER 10 MEQ PO TBCR: 10.0000 meq | EXTENDED_RELEASE_TABLET | Freq: Every day | ORAL | 0 refills | Status: DC
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
## 2023-01-14 LAB — PROSTATE-SPECIFIC AG, SERUM (LABCORP): Prostate Specific Ag, Serum: <0.1 ng/mL (ref 0.0–4.0)

## 2023-01-22 ENCOUNTER — Other Ambulatory Visit (HOSPITAL_COMMUNITY): Payer: Self-pay

## 2023-01-28 ENCOUNTER — Other Ambulatory Visit: Payer: Self-pay

## 2023-01-28 ENCOUNTER — Other Ambulatory Visit (HOSPITAL_COMMUNITY): Payer: Self-pay

## 2023-01-28 ENCOUNTER — Other Ambulatory Visit: Payer: Self-pay | Admitting: Hematology

## 2023-01-28 DIAGNOSIS — C61 Malignant neoplasm of prostate: Secondary | ICD-10-CM

## 2023-01-28 MED ORDER — ABIRATERONE ACETATE 250 MG PO TABS
1000.0000 mg | ORAL_TABLET | Freq: Every day | ORAL | 0 refills | Status: DC
Start: 2023-01-28 — End: 2023-02-27
  Filled 2023-01-28 – 2023-01-31 (×2): qty 120, 30d supply, fill #0

## 2023-01-31 ENCOUNTER — Other Ambulatory Visit: Payer: Self-pay

## 2023-01-31 ENCOUNTER — Other Ambulatory Visit (HOSPITAL_COMMUNITY): Payer: Self-pay

## 2023-02-27 ENCOUNTER — Other Ambulatory Visit (HOSPITAL_COMMUNITY): Payer: Self-pay

## 2023-02-27 ENCOUNTER — Other Ambulatory Visit: Payer: Self-pay | Admitting: Hematology

## 2023-02-27 DIAGNOSIS — C61 Malignant neoplasm of prostate: Secondary | ICD-10-CM

## 2023-02-27 MED ORDER — ABIRATERONE ACETATE 250 MG PO TABS
1000.0000 mg | ORAL_TABLET | Freq: Every day | ORAL | 0 refills | Status: DC
Start: 2023-02-27 — End: 2023-03-17
  Filled 2023-03-11: qty 120, 30d supply, fill #0

## 2023-03-11 ENCOUNTER — Other Ambulatory Visit (HOSPITAL_COMMUNITY): Payer: Self-pay

## 2023-03-11 ENCOUNTER — Other Ambulatory Visit: Payer: Self-pay

## 2023-03-16 NOTE — Assessment & Plan Note (Signed)
stage IV with bone metastasis  -diagnosed in 08/2021, initial PSA 674. -He initially had T5-6, and a T6-7 laminectomy and resection of his thoracic tumor on August 19, 2021, status post radiation to thoracic spine in February 2023. -He has castration sensitive disease, on androgen deprivation therapy with Eligard 30mg  every 4 months and Zytiga or 1000 mg with prednisone 5 mg daily since March 2023 -He is also on Xgeva 120 mg every 8 weeks -He has had excellent response to treatment, PSA is undetectable now. -He is tolerating treatment well overall.   -He is clinically doing well, with minimal pain in back, otherwise doing very well. -Lab reviewed.  Will proceed Xgeva injection today -Per patient's preference, we will continue follow-up every 2 months, Eligard every 4 months

## 2023-03-17 ENCOUNTER — Inpatient Hospital Stay: Payer: Commercial Managed Care - HMO

## 2023-03-17 ENCOUNTER — Inpatient Hospital Stay (HOSPITAL_BASED_OUTPATIENT_CLINIC_OR_DEPARTMENT_OTHER): Payer: Commercial Managed Care - HMO | Admitting: Hematology

## 2023-03-17 ENCOUNTER — Telehealth: Payer: Self-pay

## 2023-03-17 ENCOUNTER — Encounter: Payer: Self-pay | Admitting: Hematology

## 2023-03-17 ENCOUNTER — Other Ambulatory Visit: Payer: Self-pay

## 2023-03-17 ENCOUNTER — Inpatient Hospital Stay: Payer: Commercial Managed Care - HMO | Attending: Oncology

## 2023-03-17 ENCOUNTER — Other Ambulatory Visit (HOSPITAL_COMMUNITY): Payer: Self-pay

## 2023-03-17 ENCOUNTER — Encounter: Payer: Self-pay | Admitting: Pharmacist

## 2023-03-17 VITALS — BP 146/89 | HR 71 | Temp 98.8°F | Resp 18 | Ht 67.0 in | Wt 189.5 lb

## 2023-03-17 DIAGNOSIS — Z5111 Encounter for antineoplastic chemotherapy: Secondary | ICD-10-CM | POA: Diagnosis present

## 2023-03-17 DIAGNOSIS — C7951 Secondary malignant neoplasm of bone: Secondary | ICD-10-CM | POA: Insufficient documentation

## 2023-03-17 DIAGNOSIS — C61 Malignant neoplasm of prostate: Secondary | ICD-10-CM

## 2023-03-17 LAB — CBC WITH DIFFERENTIAL (CANCER CENTER ONLY)
Abs Immature Granulocytes: 0.01 10*3/uL (ref 0.00–0.07)
Basophils Absolute: 0 10*3/uL (ref 0.0–0.1)
Basophils Relative: 1 %
Eosinophils Absolute: 0.2 10*3/uL (ref 0.0–0.5)
Eosinophils Relative: 4 %
HCT: 38.8 % — ABNORMAL LOW (ref 39.0–52.0)
Hemoglobin: 14.2 g/dL (ref 13.0–17.0)
Immature Granulocytes: 0 %
Lymphocytes Relative: 37 %
Lymphs Abs: 2 10*3/uL (ref 0.7–4.0)
MCH: 31.1 pg (ref 26.0–34.0)
MCHC: 36.6 g/dL — ABNORMAL HIGH (ref 30.0–36.0)
MCV: 85.1 fL (ref 80.0–100.0)
Monocytes Absolute: 0.7 10*3/uL (ref 0.1–1.0)
Monocytes Relative: 12 %
Neutro Abs: 2.5 10*3/uL (ref 1.7–7.7)
Neutrophils Relative %: 46 %
Platelet Count: 157 10*3/uL (ref 150–400)
RBC: 4.56 MIL/uL (ref 4.22–5.81)
RDW: 12 % (ref 11.5–15.5)
WBC Count: 5.4 10*3/uL (ref 4.0–10.5)
nRBC: 0 % (ref 0.0–0.2)

## 2023-03-17 LAB — CMP (CANCER CENTER ONLY)
ALT: 22 U/L (ref 0–44)
AST: 26 U/L (ref 15–41)
Albumin: 4.2 g/dL (ref 3.5–5.0)
Alkaline Phosphatase: 67 U/L (ref 38–126)
Anion gap: 8 (ref 5–15)
BUN: 12 mg/dL (ref 6–20)
CO2: 32 mmol/L (ref 22–32)
Calcium: 9.4 mg/dL (ref 8.9–10.3)
Chloride: 104 mmol/L (ref 98–111)
Creatinine: 0.93 mg/dL (ref 0.61–1.24)
GFR, Estimated: >60 mL/min (ref >60)
Glucose, Bld: 102 mg/dL — ABNORMAL HIGH (ref 70–99)
Potassium: 3.1 mmol/L — ABNORMAL LOW (ref 3.5–5.1)
Sodium: 144 mmol/L (ref 135–145)
Total Bilirubin: 1 mg/dL (ref 0.3–1.2)
Total Protein: 7 g/dL (ref 6.5–8.1)

## 2023-03-17 MED ORDER — LEUPROLIDE ACETATE (4 MONTH) 30 MG ~~LOC~~ KIT
30.0000 mg | PACK | Freq: Once | SUBCUTANEOUS | Status: AC
  Administered 2023-03-17: 30 mg via SUBCUTANEOUS
  Filled 2023-03-17: qty 30

## 2023-03-17 MED ORDER — ABIRATERONE ACETATE 250 MG PO TABS
1000.0000 mg | ORAL_TABLET | Freq: Every day | ORAL | 2 refills | Status: DC
Start: 2023-03-17 — End: 2023-07-02
  Filled 2023-03-17 – 2023-04-10 (×2): qty 120, 30d supply, fill #0
  Filled 2023-05-06: qty 120, 30d supply, fill #1
  Filled 2023-06-09: qty 120, 30d supply, fill #2

## 2023-03-17 MED ORDER — DENOSUMAB 120 MG/1.7ML ~~LOC~~ SOLN
120.0000 mg | Freq: Once | SUBCUTANEOUS | Status: AC
  Administered 2023-03-17: 120 mg via SUBCUTANEOUS
  Filled 2023-03-17: qty 1.7

## 2023-03-17 NOTE — Telephone Encounter (Signed)
Oral Oncology Patient Advocate Encounter   Was successful in obtaining a copay card for Abiraterone Teacher, music).  This copay card will make the patients copay $5.00.   The billing information is as follows and has been shared with [facility].   RxBin: [id number] PCN:  Member ID: [id number] Group ID: [id number]   Ardeen Fillers, CPhT Oncology Pharmacy Patient Advocate  [facility]  [phone] (phone) [phone] (fax) 03/17/2023 11:58 AM
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
## 2023-03-17 NOTE — Telephone Encounter (Signed)
Erroneous Encounter

## 2023-03-27 ENCOUNTER — Encounter: Payer: Self-pay | Admitting: Hematology

## 2023-03-27 NOTE — Progress Notes (Signed)
Patient called referred by provider for assistance with medication costs.  Introduced myself as Dance movement psychotherapist and to discuss available resources. Discussed one-time $200 [employer] grant to assist with medication and transportation cost(gas cards only). Also, discussed one-time $1000 [employer] grant to assist with personal expenses while going through treatment. Advised what is needed to apply. He will call me next week when he can provide information and I will advise registration he will be coming to complete grant process. He verbalized understanding.  He has my card to do so and for any additional financial questions or concerns.

## 2023-04-02 ENCOUNTER — Encounter: Payer: Self-pay | Admitting: Hematology

## 2023-04-02 NOTE — Progress Notes (Signed)
Patient completed grant paperwork.  Patient approved for one-time $1000 [employer] grant  to assist with personal expenses while going through treatment and one-time $200 [employer] grant(for medication and gas cards only). He received a copy of the approval letter and expense sheet in green folder. He was advised to contact me at earliest convenience to discuss grant details.  He has my card to do so and for any additional financial questions or concerns.

## 2023-04-02 NOTE — Progress Notes (Unsigned)
Patient provided document for [employer] and [employer] grants.  Patient needs to complete grant paperwork.

## 2023-04-09 ENCOUNTER — Other Ambulatory Visit (HOSPITAL_COMMUNITY): Payer: Self-pay

## 2023-04-10 ENCOUNTER — Other Ambulatory Visit: Payer: Self-pay

## 2023-04-10 ENCOUNTER — Other Ambulatory Visit (HOSPITAL_COMMUNITY): Payer: Self-pay

## 2023-04-17 ENCOUNTER — Other Ambulatory Visit (HOSPITAL_COMMUNITY): Payer: Self-pay

## 2023-05-06 ENCOUNTER — Other Ambulatory Visit: Payer: Self-pay

## 2023-05-06 ENCOUNTER — Other Ambulatory Visit (HOSPITAL_COMMUNITY): Payer: Self-pay

## 2023-05-06 NOTE — Progress Notes (Signed)
Specialty Pharmacy Refill Coordination Note  Clifford Jenkins is a 62 y.o. male contacted today regarding refills of specialty medication(s) Abiraterone Acetate .  Patient requested Delivery  on 05/14/23  to verified address [address]   [place] [address]   Medication will be filled on 05/13/23.

## 2023-05-12 ENCOUNTER — Encounter: Payer: Self-pay | Admitting: Hematology

## 2023-05-13 ENCOUNTER — Other Ambulatory Visit: Payer: Self-pay

## 2023-05-13 ENCOUNTER — Other Ambulatory Visit (HOSPITAL_COMMUNITY): Payer: Self-pay

## 2023-05-13 NOTE — Progress Notes (Signed)
Medication Delayed due to insufficient inventory, Zytiga will ship 05/14/23. Patient voice mail box is not set up, unable to reach patient.

## 2023-05-18 NOTE — Assessment & Plan Note (Signed)
stage IV with bone metastasis  -diagnosed in 08/2021, initial PSA 674. -He initially had T5-6, and a T6-7 laminectomy and resection of his thoracic tumor on August 19, 2021, status post radiation to thoracic spine in February 2023. -He has castration sensitive disease, on androgen deprivation therapy with Eligard 30mg  every 4 months and Zytiga or 1000 mg with prednisone 5 mg daily since March 2023 -He is also on Xgeva 120 mg every 8 weeks -He has had excellent response to treatment, PSA is undetectable now. -He is tolerating treatment well overall.   -He is clinically doing well, with minimal pain in back, otherwise doing very well. -Per patient's preference, we will continue follow-up every 2 months, Eligard every 4 months

## 2023-05-19 ENCOUNTER — Inpatient Hospital Stay (HOSPITAL_BASED_OUTPATIENT_CLINIC_OR_DEPARTMENT_OTHER): Payer: Commercial Managed Care - HMO | Admitting: Hematology

## 2023-05-19 ENCOUNTER — Inpatient Hospital Stay: Payer: Commercial Managed Care - HMO

## 2023-05-19 ENCOUNTER — Inpatient Hospital Stay: Payer: Commercial Managed Care - HMO | Attending: Oncology

## 2023-05-19 VITALS — BP 138/88 | HR 62 | Temp 98.4°F | Resp 18 | Ht 67.0 in | Wt 187.7 lb

## 2023-05-19 VITALS — BP 143/87 | HR 66 | Temp 98.2°F | Resp 17

## 2023-05-19 DIAGNOSIS — C61 Malignant neoplasm of prostate: Secondary | ICD-10-CM | POA: Insufficient documentation

## 2023-05-19 DIAGNOSIS — C7951 Secondary malignant neoplasm of bone: Secondary | ICD-10-CM | POA: Diagnosis not present

## 2023-05-19 DIAGNOSIS — E876 Hypokalemia: Secondary | ICD-10-CM

## 2023-05-19 DIAGNOSIS — I10 Essential (primary) hypertension: Secondary | ICD-10-CM

## 2023-05-19 LAB — CBC WITH DIFFERENTIAL (CANCER CENTER ONLY)
Abs Immature Granulocytes: 0.01 10*3/uL (ref 0.00–0.07)
Basophils Absolute: 0 10*3/uL (ref 0.0–0.1)
Basophils Relative: 1 %
Eosinophils Absolute: 0.1 10*3/uL (ref 0.0–0.5)
Eosinophils Relative: 3 %
HCT: 39.4 % (ref 39.0–52.0)
Hemoglobin: 14.1 g/dL (ref 13.0–17.0)
Immature Granulocytes: 0 %
Lymphocytes Relative: 38 %
Lymphs Abs: 1.6 10*3/uL (ref 0.7–4.0)
MCH: 31.3 pg (ref 26.0–34.0)
MCHC: 35.8 g/dL (ref 30.0–36.0)
MCV: 87.4 fL (ref 80.0–100.0)
Monocytes Absolute: 0.5 10*3/uL (ref 0.1–1.0)
Monocytes Relative: 11 %
Neutro Abs: 2 10*3/uL (ref 1.7–7.7)
Neutrophils Relative %: 47 %
Platelet Count: 161 10*3/uL (ref 150–400)
RBC: 4.51 MIL/uL (ref 4.22–5.81)
RDW: 12.1 % (ref 11.5–15.5)
WBC Count: 4.2 10*3/uL (ref 4.0–10.5)
nRBC: 0 % (ref 0.0–0.2)

## 2023-05-19 LAB — CMP (CANCER CENTER ONLY)
ALT: 22 U/L (ref 0–44)
AST: 28 U/L (ref 15–41)
Albumin: 4.2 g/dL (ref 3.5–5.0)
Alkaline Phosphatase: 55 U/L (ref 38–126)
Anion gap: 7 (ref 5–15)
BUN: 12 mg/dL (ref 6–20)
CO2: 30 mmol/L (ref 22–32)
Calcium: 9.1 mg/dL (ref 8.9–10.3)
Chloride: 106 mmol/L (ref 98–111)
Creatinine: 1 mg/dL (ref 0.61–1.24)
GFR, Estimated: >60 mL/min (ref >60)
Glucose, Bld: 85 mg/dL (ref 70–99)
Potassium: 2.8 mmol/L — ABNORMAL LOW (ref 3.5–5.1)
Sodium: 143 mmol/L (ref 135–145)
Total Bilirubin: 1 mg/dL (ref 0.3–1.2)
Total Protein: 7 g/dL (ref 6.5–8.1)

## 2023-05-19 MED ORDER — LEUPROLIDE ACETATE (6 MONTH) 45 MG ~~LOC~~ KIT: 45.0000 mg | PACK | Freq: Once | SUBCUTANEOUS | Status: DC

## 2023-05-19 MED ORDER — DENOSUMAB 120 MG/1.7ML ~~LOC~~ SOLN
120.0000 mg | Freq: Once | SUBCUTANEOUS | Status: AC
  Administered 2023-05-19: 120 mg via SUBCUTANEOUS
  Filled 2023-05-19: qty 1.7

## 2023-05-19 MED ORDER — POTASSIUM CHLORIDE CRYS ER 20 MEQ PO TBCR: 20.0000 meq | EXTENDED_RELEASE_TABLET | Freq: Two times a day (BID) | ORAL | 0 refills | Status: DC
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
## 2023-05-20 LAB — PROSTATE-SPECIFIC AG, SERUM (LABCORP): Prostate Specific Ag, Serum: <0.1 ng/mL (ref 0.0–4.0)

## 2023-05-22 ENCOUNTER — Other Ambulatory Visit: Payer: Self-pay

## 2023-06-03 ENCOUNTER — Other Ambulatory Visit: Payer: Self-pay

## 2023-06-06 ENCOUNTER — Other Ambulatory Visit: Payer: Self-pay

## 2023-06-09 ENCOUNTER — Other Ambulatory Visit: Payer: Self-pay

## 2023-06-09 NOTE — Progress Notes (Signed)
Specialty Pharmacy Refill Coordination Note  Clifford Jenkins is a 61 y.o. male contacted today regarding refills of specialty medication(s) Abiraterone Acetate   Patient requested Delivery   Delivery date: 06/16/23  Verified address: [address]   [place] [address]   Medication will be filled on 06/13/23

## 2023-06-11 ENCOUNTER — Other Ambulatory Visit: Payer: Self-pay | Admitting: Hematology

## 2023-07-01 ENCOUNTER — Encounter: Payer: Self-pay | Admitting: Hematology

## 2023-07-02 ENCOUNTER — Other Ambulatory Visit: Payer: Self-pay

## 2023-07-02 ENCOUNTER — Other Ambulatory Visit: Payer: Self-pay | Admitting: Hematology

## 2023-07-02 DIAGNOSIS — C7951 Secondary malignant neoplasm of bone: Secondary | ICD-10-CM

## 2023-07-02 MED ORDER — ABIRATERONE ACETATE 250 MG PO TABS
1000.0000 mg | ORAL_TABLET | Freq: Every day | ORAL | 2 refills | Status: DC
Start: 2023-07-02 — End: 2023-10-07
  Filled 2023-07-02: qty 120, 30d supply, fill #0
  Filled 2023-08-04: qty 120, 30d supply, fill #1
  Filled 2023-09-05: qty 120, 30d supply, fill #2

## 2023-07-02 NOTE — Progress Notes (Signed)
Specialty Pharmacy Ongoing Clinical Assessment Note  Clifford Jenkins is a 62 y.o. male who is being followed by the specialty pharmacy service for RxSp Oncology   Patient's specialty medication(s) reviewed today: Abiraterone Acetate   Missed doses in the last 4 weeks: 0   Patient/Caregiver did not have any additional questions or concerns.   Therapeutic benefit summary: Patient is achieving benefit   Adverse events/side effects summary: No adverse events/side effects   Patient's therapy is appropriate to: Continue    Goals Addressed             This Visit's Progress    Slow Disease Progression       Patient is on track. Patient will maintain adherence. PSA remains undetectable as of 05/19/23 labs.          Follow up:  6 months  Otto Herb Specialty Pharmacist

## 2023-07-02 NOTE — Progress Notes (Signed)
Pending Refill Coordination completed.

## 2023-07-02 NOTE — Progress Notes (Signed)
Specialty Pharmacy Refill Coordination Note  Clifford Jenkins is a 62 y.o. male contacted today regarding refills of specialty medication(s) Abiraterone Acetate   Patient requested Delivery   Delivery date: 07/15/23   Verified address: [address]   [place] [address]   Medication will be filled on 07/14/23.

## 2023-07-14 ENCOUNTER — Other Ambulatory Visit: Payer: Self-pay

## 2023-07-14 ENCOUNTER — Encounter: Payer: Self-pay | Admitting: Hematology

## 2023-07-14 IMAGING — RF DG THORACIC SPINE 2V
1 series · 2 of 2 positions shown · non-contrast
Comparison: 01/17/2022

CLINICAL DATA: Status post T-spine laminectomy.

EXAM:
THORACIC SPINE 2 VIEWS; DG C-ARM 1-60 MIN-NO REPORT

[Series 1: run · 2 of 2 slices shown]
[im 1/2]
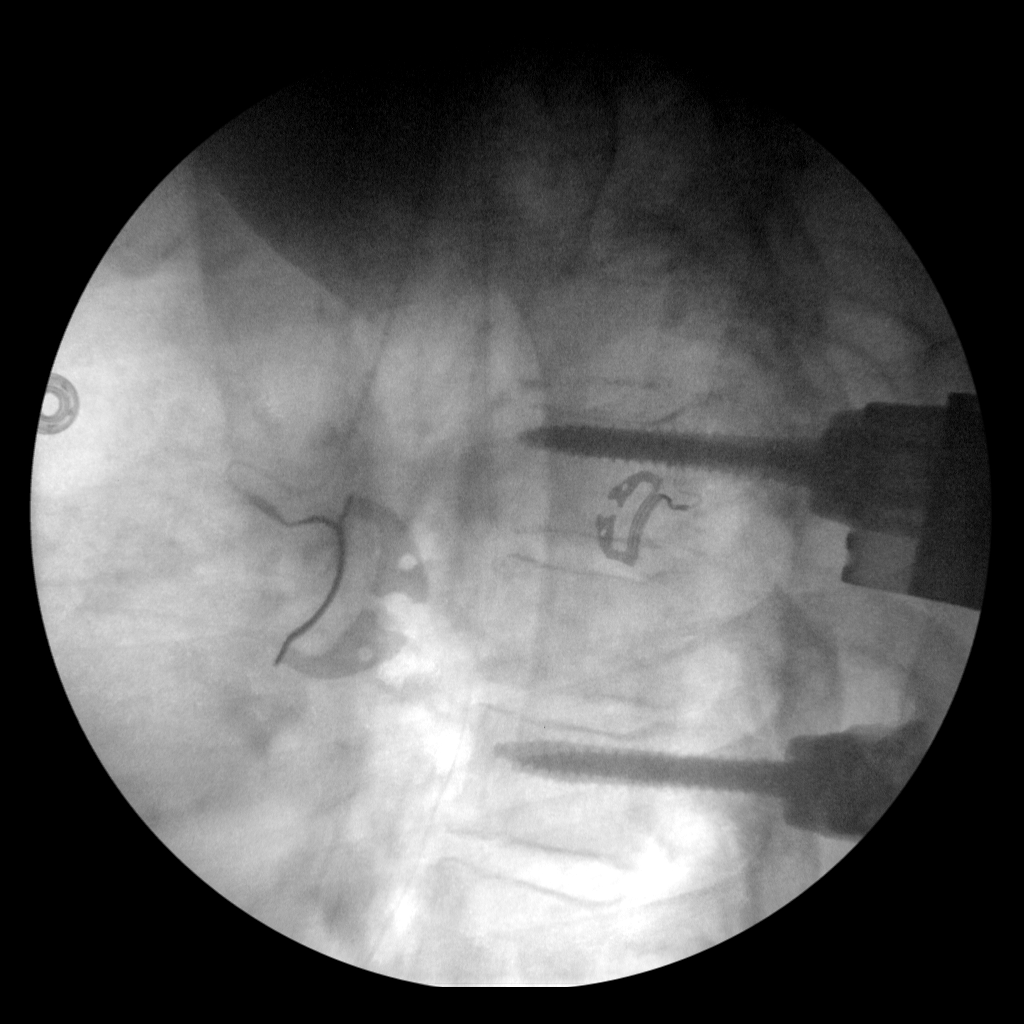
[im 2/2]
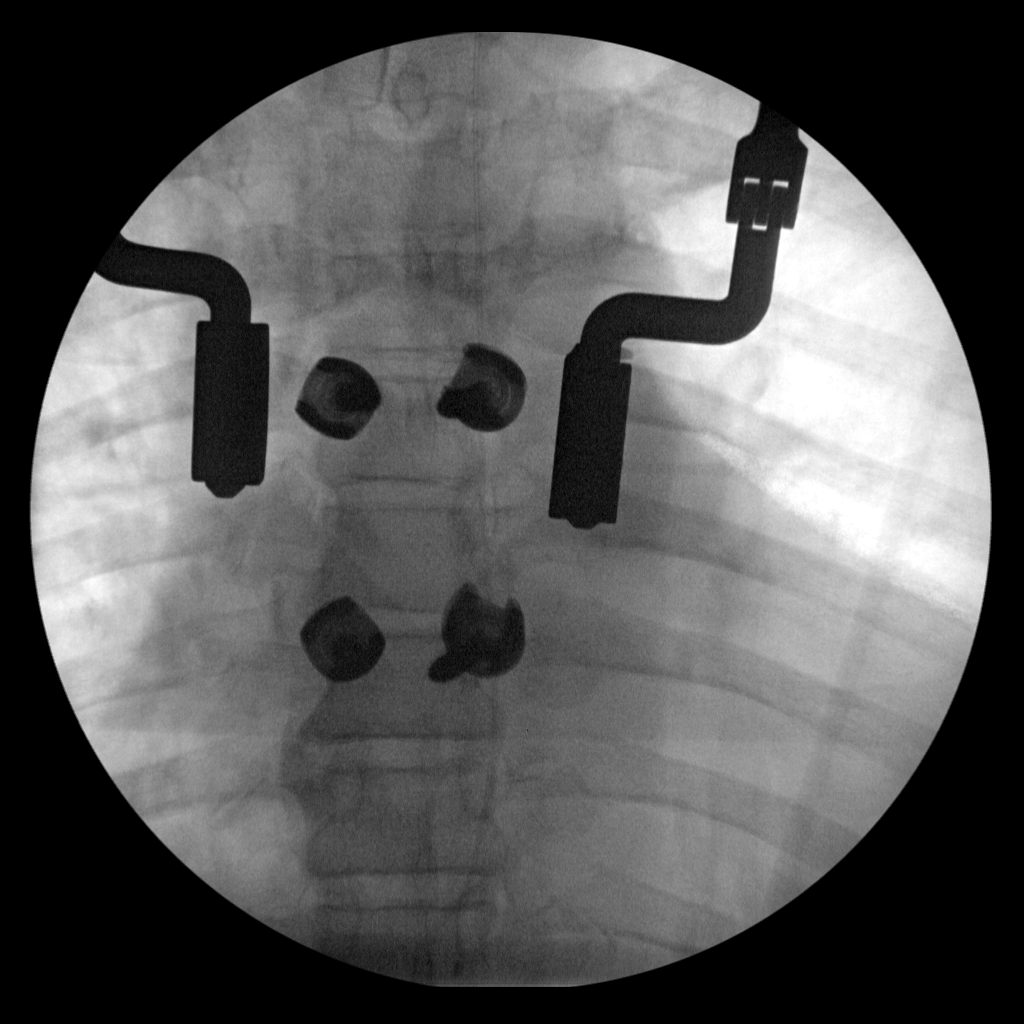

[2 of 2 positions shown; findings below may reference images not displayed]

FINDINGS: 2 images from portable C-arm radiography obtained in the operating
room show changes from T5-6 and T6-7 laminectomy and T5-6 and T6-7
posterior-lateral arthrodesis.
IMPRESSION: 1. Postoperative changes as above.

## 2023-07-14 IMAGING — CT CT ABD-PELV W/O CM
2 of 4 series · 16 of 46 positions shown, 18 images · non-contrast
Comparison: 04/15/2012

CLINICAL DATA: Abdominal pain

EXAM:
CT ABDOMEN AND PELVIS WITHOUT CONTRAST
TECHNIQUE: Multidetector CT imaging of the abdomen and pelvis was performed
following the standard protocol without IV contrast.

[Series 3: a/p w/o 5mm · axial · non-contrast · 0.78mm/px · z∈[+978,+1383]mm · 13 of 89 slices shown, 15 images]
[im 4/89  soft-tissue]
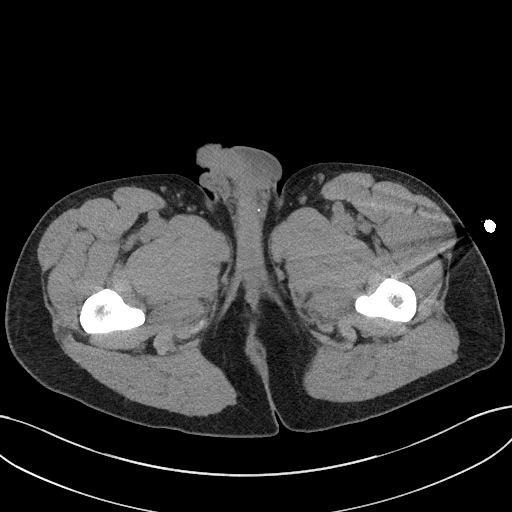
[im 4/89  bone]
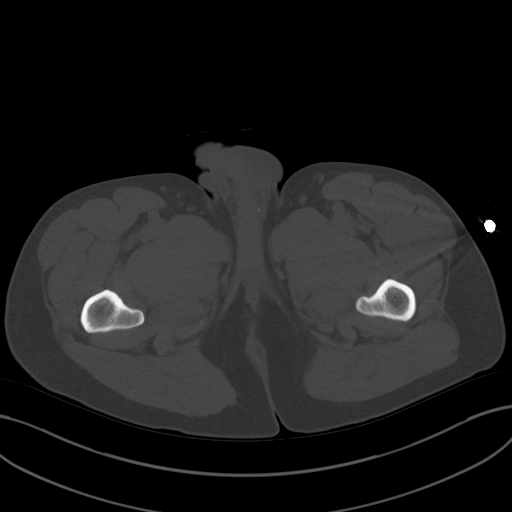
[im 12/89  soft-tissue]
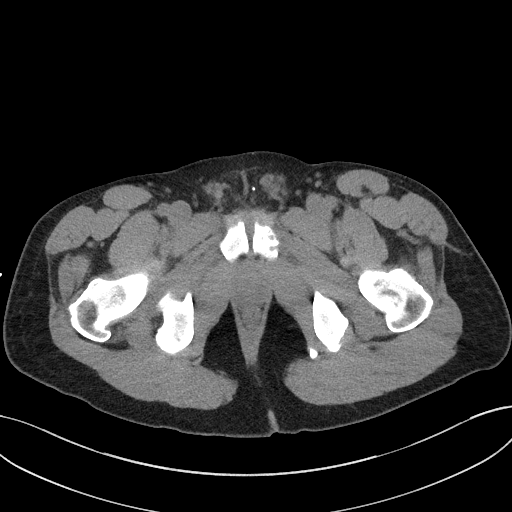
[im 19/89  soft-tissue]
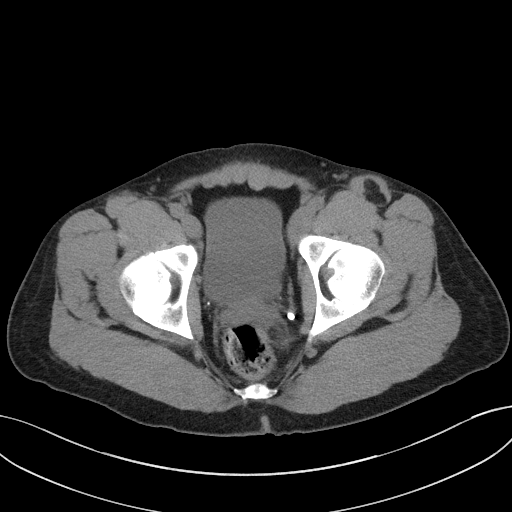
[im 26/89  soft-tissue]
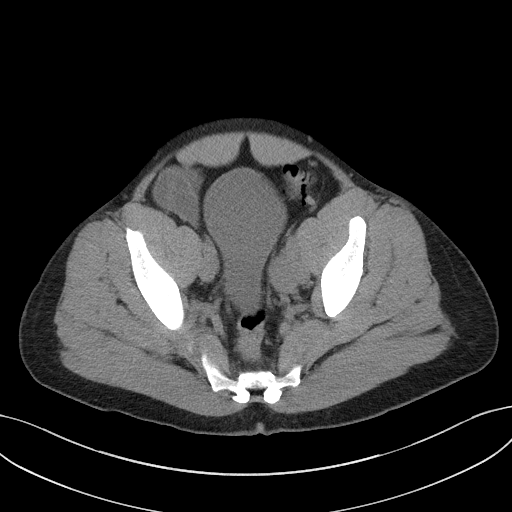
[im 30/89  soft-tissue]
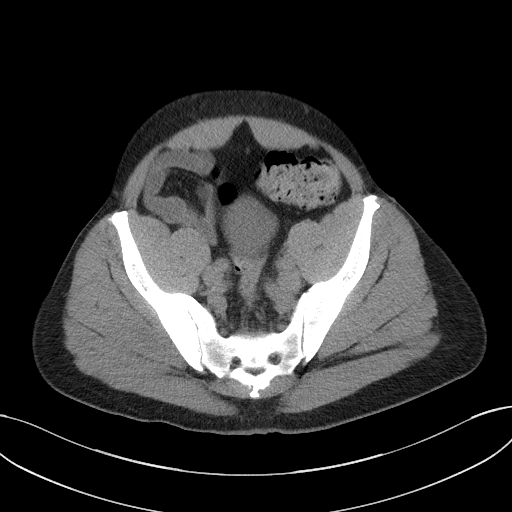
[im 37/89  soft-tissue]
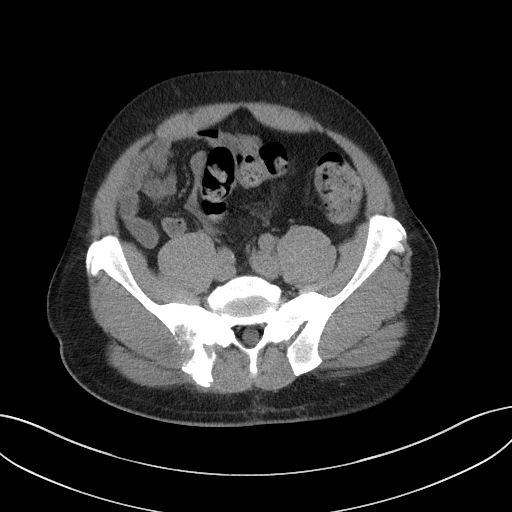
[im 45/89  soft-tissue]
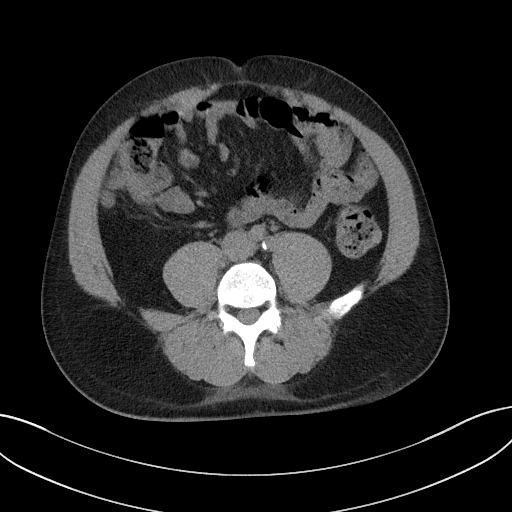
[im 52/89  soft-tissue]
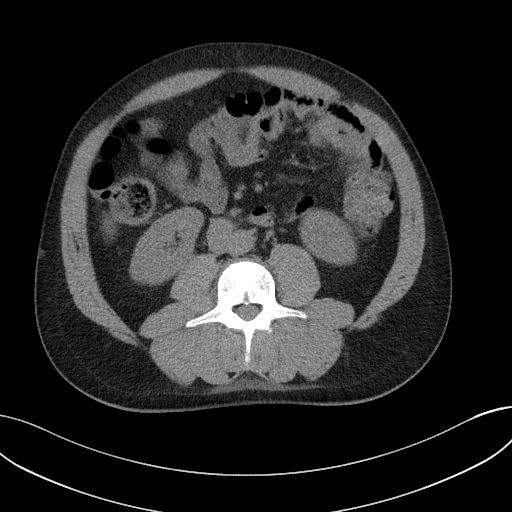
[im 59/89  soft-tissue]
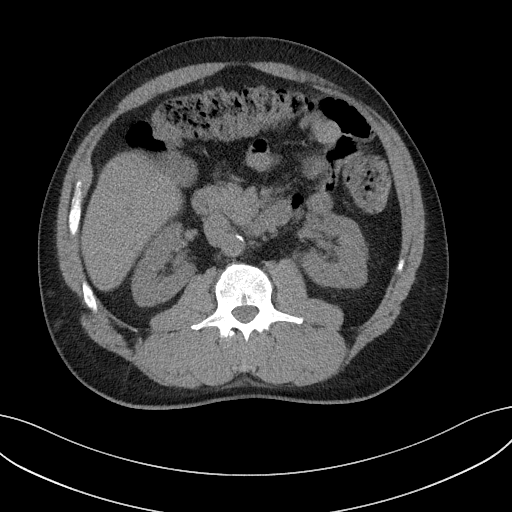
[im 59/89  bone]
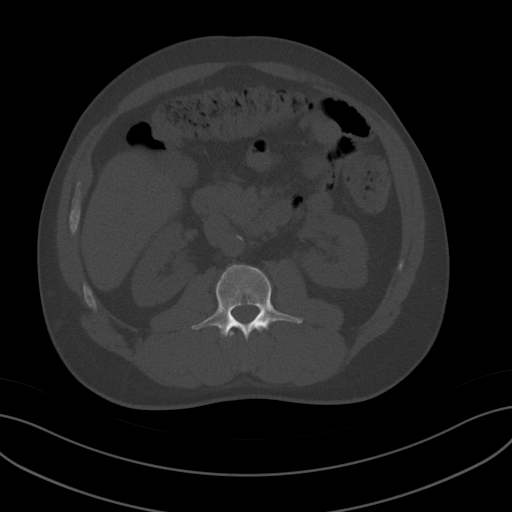
[im 63/89  soft-tissue]
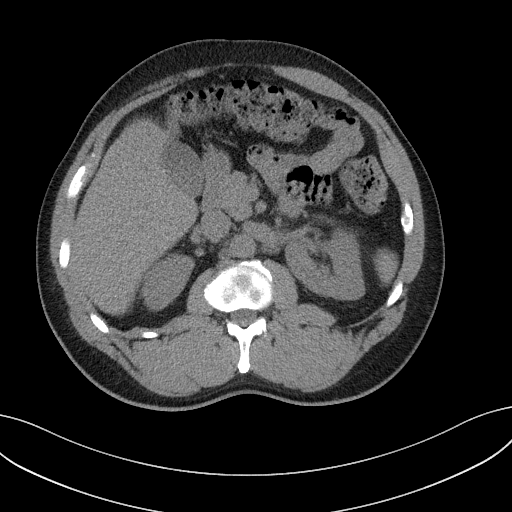
[im 70/89  soft-tissue]
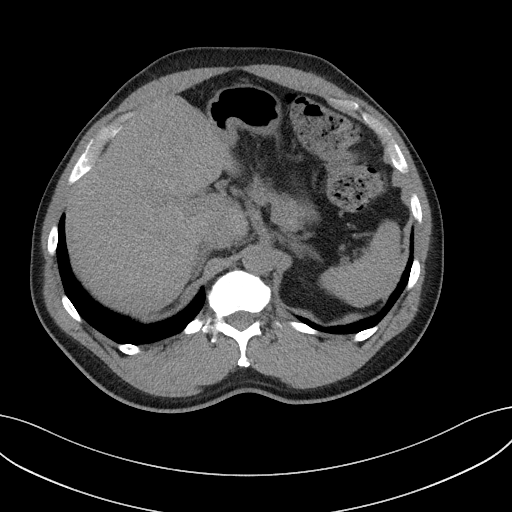
[im 78/89  soft-tissue]
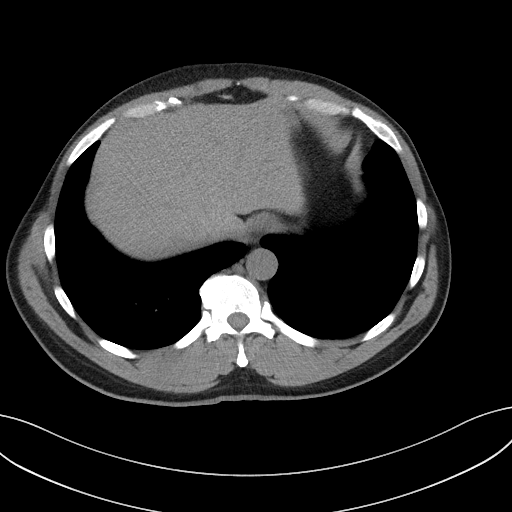
[im 85/89  soft-tissue]
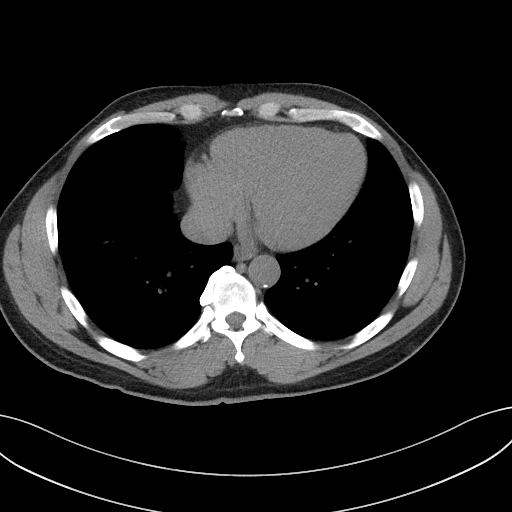

[Series 6: a/p w/o cor · coronal · non-contrast · 0.78mm/px · 3 of 151 slices shown]
[im 51/151  soft-tissue]
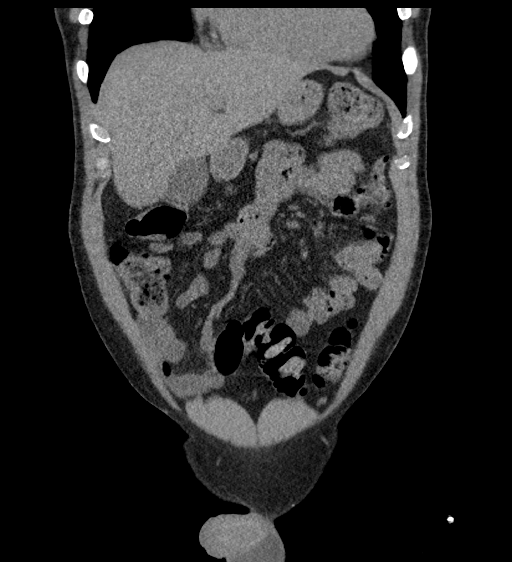
[im 67/151  soft-tissue]
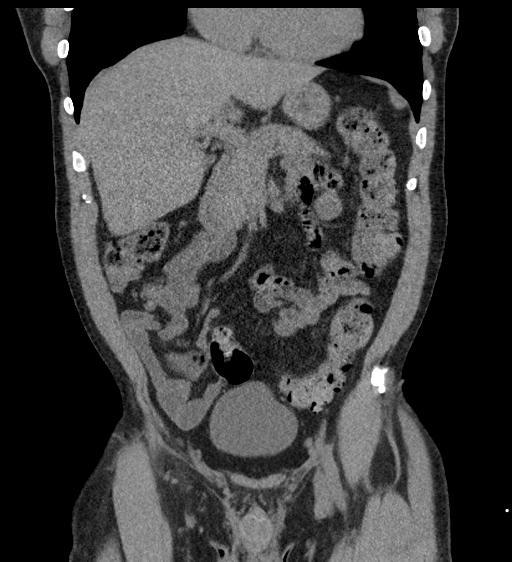
[im 84/151  soft-tissue]
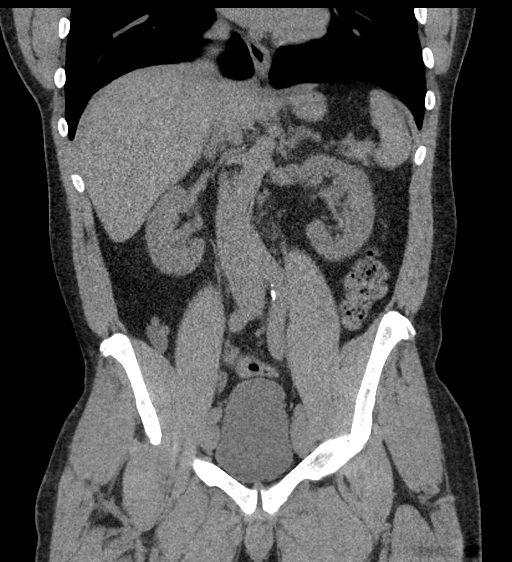

[16 of 46 positions shown; findings below may reference images not displayed]

FINDINGS: Lower chest: No acute abnormality.

Hepatobiliary: No focal hepatic abnormality. Gallbladder
unremarkable.

Pancreas: No focal abnormality or ductal dilatation.

Spleen: No focal abnormality.  Normal size.

Adrenals/Urinary Tract: No adrenal abnormality. No focal renal
abnormality. No stones or hydronephrosis. Urinary bladder is
unremarkable.

Stomach/Bowel: Moderate stool burden throughout the colon. Normal
appendix. Stomach, large and small bowel grossly unremarkable.

Vascular/Lymphatic: Aortic atherosclerosis. No aneurysm. Probable
enlarged left external iliac chain lymph node on image 67 of series
3 with a short axis diameter of 3 cm. No additional adenopathy.

Reproductive: No visible focal abnormality.

Other: No free fluid or free air.

Musculoskeletal: Sclerotic lesion throughout the right iliac bone.
Rounded sclerotic lesion in the left iliac bone. These are new since
prior study. Mixed sclerotic and lucent destructive lesion noted
superiorly in the right iliac bone. Mixed lucent and sclerotic
lesions within the L1 and L3 vertebral body. These are new since
prior study. Predominantly lytic lesion within the inferior right
femoral head.
IMPRESSION: Mixed lytic and sclerotic lesions seen within the L2, L4 vertebral
bodies and right iliac bone with lytic destructive lesion in the
superior aspect of the right iliac bone near the right SI joint.
Rounded sclerotic lesion within the left iliac bone. Predominantly
lytic lesion within the inferior right femoral head. Appearance is
concerning for possible metastatic disease. Recommend clinical
correlation for cancer history. These could be further evaluated
with nuclear medicine bone scan.

Left external iliac adenopathy could also reflect metastatic disease
or lymphoma.

Aortic atherosclerosis.

Moderate stool burden throughout the colon.

## 2023-07-14 IMAGING — MR MR THORACIC SPINE WO/W CM
14 of 29 series · 16 of 48 positions shown · IV contrast (gadavist)
Comparison: CT Chest, Abdomen, and Pelvis 8861 hours and 8787 hours
today.
COMPARISON: CT Chest, Abdomen, and Pelvis 8861 hours and 8787 hours
today.

Addendum:
CLINICAL DATA: 60-year-old male with abdominal pain, evidence of
osseous metastatic disease on CT Chest, Abdomen, and Pelvis today,
including multiple lytic spine lesions.

EXAM:
MRI THORACIC AND LUMBAR SPINE WITHOUT AND WITH CONTRAST
TECHNIQUE: Multiplanar and multiecho pulse sequences of the thoracic and lumbar
spine were obtained without and with intravenous contrast.
CONTRAST:  8mL GADAVIST GADOBUTROL 1 MMOL/ML IV SOLN

[Series 12: T1 · sagittal · 5.0mm · 1.56mm/px · 1 of 9 slices shown (1 of 7)]
[im 1/9]
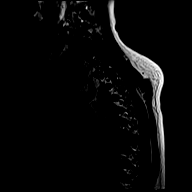

[Series 13: T1 · sagittal · 5.0mm · 1.29mm/px · 1 of 9 slices shown (2 of 7)]
[im 1/9]
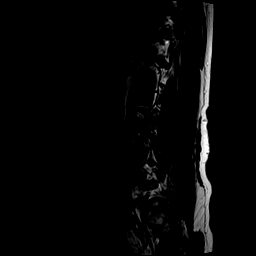

[Series 14: T1 · sagittal · 6.0mm · 1.29mm/px · 1 of 8 slices shown (3 of 7)]
[im 1/8]
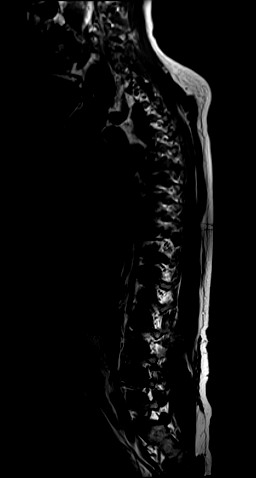

[Series 15: T2 · sagittal · 3.0mm · 0.76mm/px · 1 of 17 slices shown (1 of 4)]
[im 1/17]
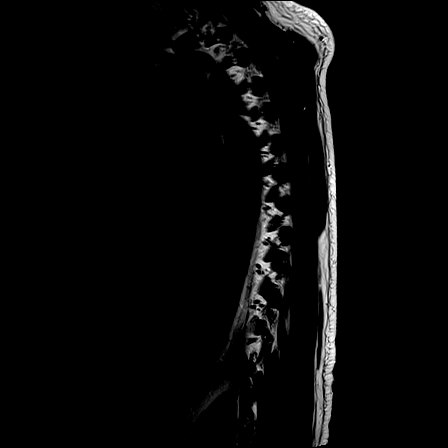

[Series 16: T1 · sagittal · 3.0mm · 0.76mm/px · 1 of 17 slices shown (4 of 7)]
[im 1/17]
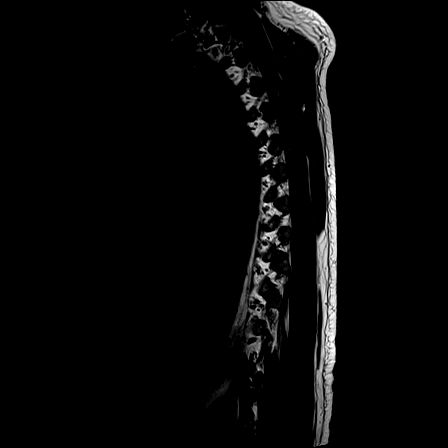

[Series 17: STIR · sagittal · 3.0mm · 0.38mm/px · 1 of 17 slices shown]
[im 1/17]
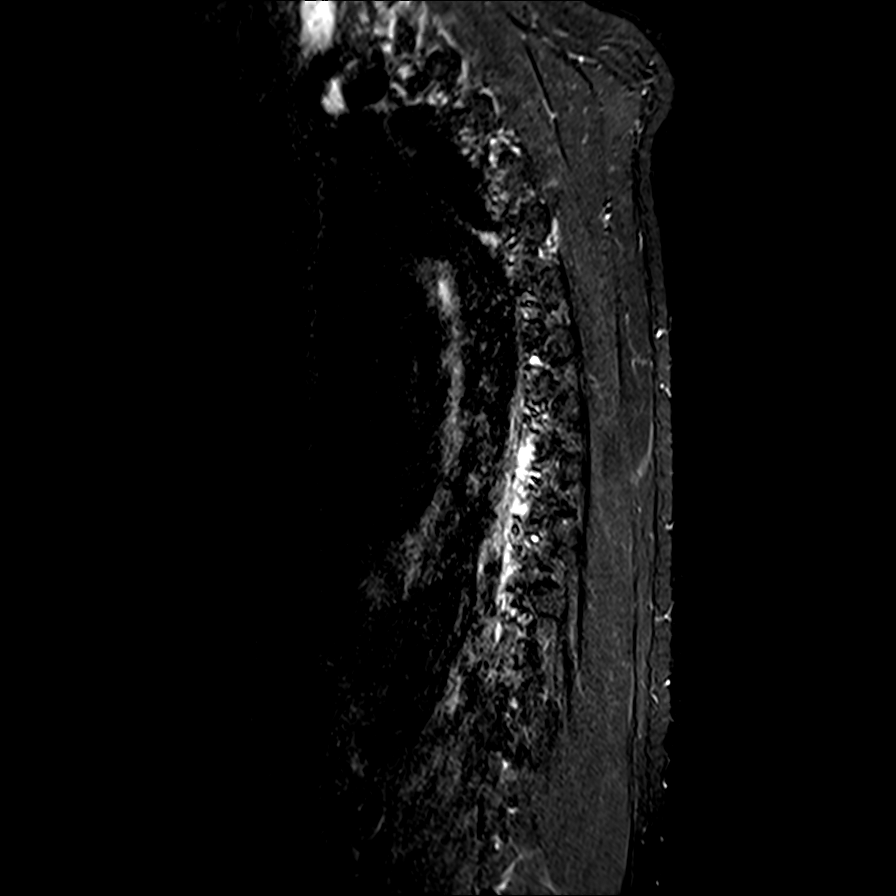

[Series 18: T2 · axial · 4.0mm · 0.59mm/px · 1 of 39 slices shown (2 of 4)]
[im 1/39]
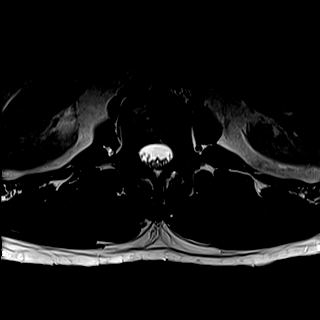

[Series 20: T1 · axial · non-contrast · 4.0mm · 0.31mm/px · 1 of 39 slices shown (5 of 7)]
[im 1/39]
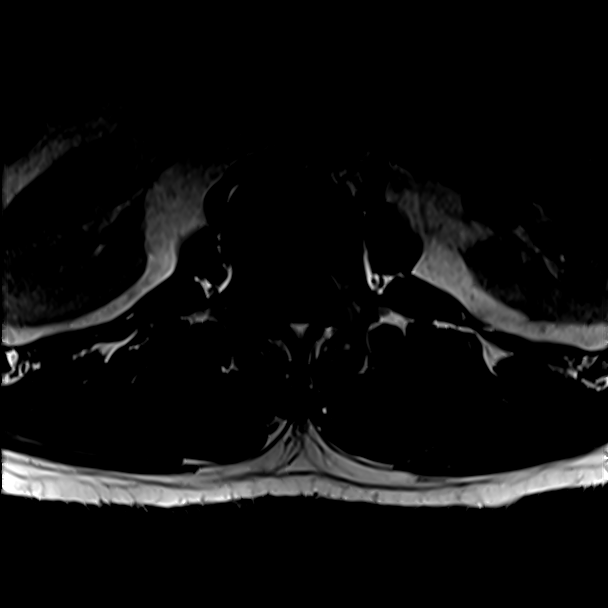

[Series 25: T2 · sagittal · 4.0mm · 0.73mm/px · 1 of 17 slices shown (3 of 4)]
[im 1/17]
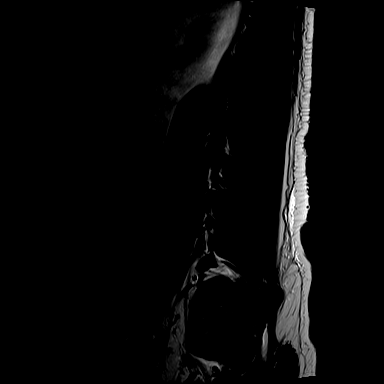

[Series 27: T1 · sagittal · 4.0mm · 0.88mm/px · 1 of 17 slices shown (6 of 7)]
[im 1/17]
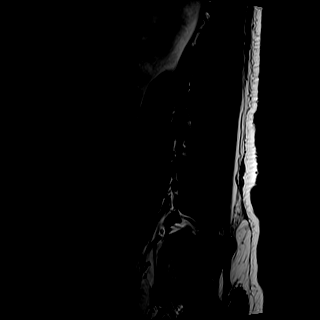

[Series 28: T2 · axial · 4.5mm · 0.57mm/px · z∈[-485,-256]mm · 2 of 39 slices shown (4 of 4)]
[im 1/39]
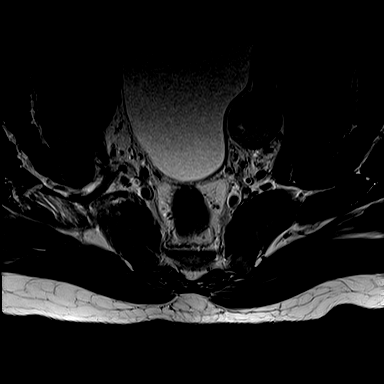
[im 39/39]
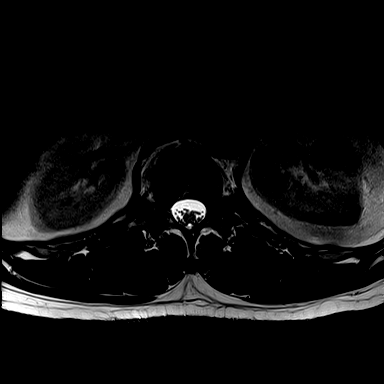

[Series 29: T1 · axial · 4.5mm · 0.34mm/px · z∈[-485,-256]mm · 2 of 39 slices shown (7 of 7)]
[im 1/39]
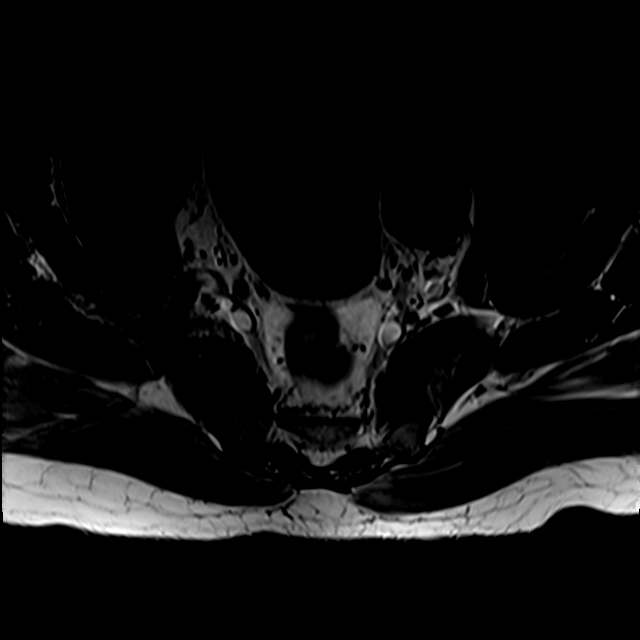
[im 39/39]
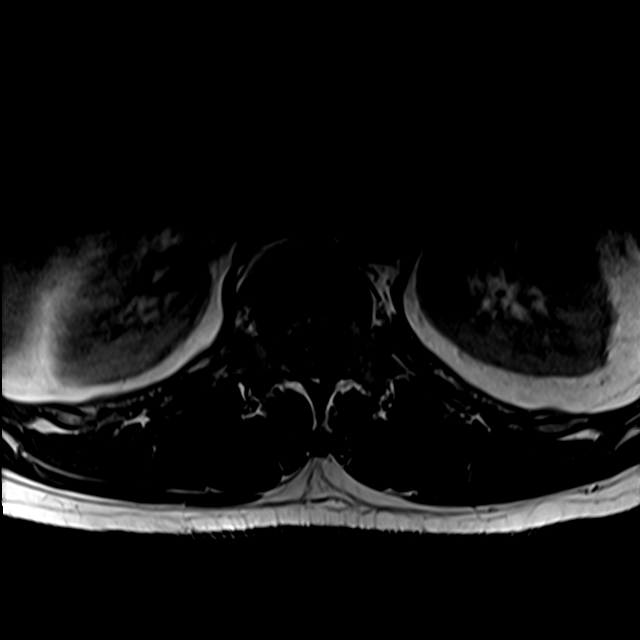

[Series 30: T1 fat-sat post-contrast · sagittal · 4.0mm · 0.88mm/px · 1 of 17 slices shown (1 of 2)]
[im 1/17]
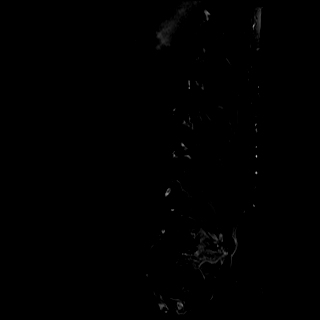

[Series 33: T1 fat-sat post-contrast · sagittal · 3.0mm · 0.76mm/px · 1 of 17 slices shown (2 of 2)]
[im 1/17]
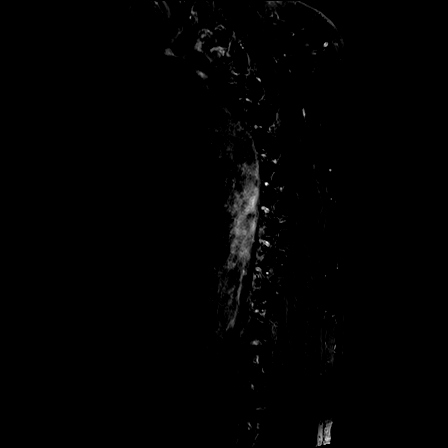

[16 of 48 positions shown; findings below may reference images not displayed]

FINDINGS: MRI THORACIC SPINE FINDINGS

Limited cervical spine imaging:  Grossly negative.

Thoracic spine segmentation:  Normal on the comparison.

Alignment:  Maintained thoracic kyphosis.

Vertebrae: Enhancing STIR hyperintense tumor in the right T1 facets,
replacing the T3 vertebral body (additional details below, in the
superior T4 vertebral body, and subtotally replacing the T6 vertebra
(details below).

No definite posterior rib metastasis.

Cord: Moderate to severe cord compression at the T6 level (series
18, image 18). See further detailed below. Mild associated cord
edema.

Above and below that level spinal cord signal and morphology within
normal limits. There is mild dural thickening and enhancement there
(series 32, image 18), but no convincing intradural enhancement, no
abnormal dural thickening or enhancement elsewhere. Conus medullaris
appears normal at T12-L1.

Paraspinal and other soft tissues: Early ventral and lateral
paraspinal extension of tumor suspected at T6. See additional
details below. And there might also be early extraosseous extension
of tumor from the right T1 facets (series 7, image 2).

Other thoracic and abdominal viscera as reported by CT earlier
today.

Disc levels:

T3: Mild superior endplate pathologic compression fracture stable
from the earlier CT. Early ventral epidural tumor (series 15, image
9) but no cord compression. No foraminal involvement.

T5-T6: Bulky up to 25 mm long axis epidural tumor occupies the right
spinal canal here extending to just above the T5-T6 disc space
(series 15, image 7) with underlying subtotal T6 vertebral body
tumor replacement including posterior elements on the right side.
The partially cystic or necrotic epidural mass results in moderate
to severe compression of the spinal cord into the anterior left
canal (series 18, image 18) plus some cord edema as above. There is
also subtotal occlusion of the right T6 neural foramen, and mild
stenosis of the right T5 foramen. Additional mild T6 pathologic
inferior endplate compression was better demonstrated by CT today.

No other thoracic epidural tumor or spinal stenosis.

MRI LUMBAR SPINE FINDINGS

Segmentation:  Normal, concordant with the numbering above.

Alignment:  Preserved lumbar lordosis.

Vertebrae: Subtotal replacement of the L1 and L3 vertebral bodies by
STIR hyperintense heterogeneously enhancing tumor. Mild pathologic
inferior compression at both levels as seen by CT today. Posterior
elements of both levels appear spared.

Large infiltrating tumor of the right iliac bone with posterior
extraosseous extension (series 31, image 31 and series 26, image 1)
abuts the right SI joint. Smaller round left medial iliac bone
metastasis. Visible sacrum remains intact.

Conus medullaris: Extends to the T12-L1 level. No lower spinal cord
or conus signal abnormality. No abnormal intradural enhancement.
Normal cauda equina nerve roots. No lumbosacral dural thickening.

Paraspinal and other soft tissues: Severe bladder distension. Oval
metastatic left iliac lymph node up to 3.8 cm diameter (series 31,
image 38). Other abdominal and pelvic viscera as on CT today.

Disc levels:

No lumbar epidural or foraminal tumor. Disc and endplate
degeneration maximal at L3-L4 with mild degenerative foraminal
stenosis at that level.
IMPRESSION: 1. Widespread osseous metastatic disease in the thoracolumbar spine
and pelvis. Vertebral tumor with mild pathologic compression
fractures of T3, T6, L1, and L3.

2. Large, 2.5 cm epidural tumor extending from the T6 vertebra
occupies the right spinal canal at T5-T6 with Moderate To Severe
Cord Compression, And Mild Spinal Cord Edema. Associated near
complete occlusion of the right T6 neural foramen. And early
extraosseous tumor extension suspected from the right T1 facet.

3. No other thoracic epidural tumor or spinal stenosis.

4. Severe bladder distension. Large 3.8 cm metastatic left iliac
chain lymph node.

5. Bulky right posterior iliac wing tumor and extraosseous
extension.

ADDENDUM:
Salient findings discussed by telephone with Dr. Locklear in the
[HOSPITAL] 3777 hours.

*** End of Addendum ***
FINDINGS: MRI THORACIC SPINE FINDINGS

Limited cervical spine imaging:  Grossly negative.

Thoracic spine segmentation:  Normal on the comparison.

Alignment:  Maintained thoracic kyphosis.

Vertebrae: Enhancing STIR hyperintense tumor in the right T1 facets,
replacing the T3 vertebral body (additional details below, in the
superior T4 vertebral body, and subtotally replacing the T6 vertebra
(details below).

No definite posterior rib metastasis.

Cord: Moderate to severe cord compression at the T6 level (series
18, image 18). See further detailed below. Mild associated cord
edema.

Above and below that level spinal cord signal and morphology within
normal limits. There is mild dural thickening and enhancement there
(series 32, image 18), but no convincing intradural enhancement, no
abnormal dural thickening or enhancement elsewhere. Conus medullaris
appears normal at T12-L1.

Paraspinal and other soft tissues: Early ventral and lateral
paraspinal extension of tumor suspected at T6. See additional
details below. And there might also be early extraosseous extension
of tumor from the right T1 facets (series 7, image 2).

Other thoracic and abdominal viscera as reported by CT earlier
today.

Disc levels:

T3: Mild superior endplate pathologic compression fracture stable
from the earlier CT. Early ventral epidural tumor (series 15, image
9) but no cord compression. No foraminal involvement.

T5-T6: Bulky up to 25 mm long axis epidural tumor occupies the right
spinal canal here extending to just above the T5-T6 disc space
(series 15, image 7) with underlying subtotal T6 vertebral body
tumor replacement including posterior elements on the right side.
The partially cystic or necrotic epidural mass results in moderate
to severe compression of the spinal cord into the anterior left
canal (series 18, image 18) plus some cord edema as above. There is
also subtotal occlusion of the right T6 neural foramen, and mild
stenosis of the right T5 foramen. Additional mild T6 pathologic
inferior endplate compression was better demonstrated by CT today.

No other thoracic epidural tumor or spinal stenosis.

MRI LUMBAR SPINE FINDINGS

Segmentation:  Normal, concordant with the numbering above.

Alignment:  Preserved lumbar lordosis.

Vertebrae: Subtotal replacement of the L1 and L3 vertebral bodies by
STIR hyperintense heterogeneously enhancing tumor. Mild pathologic
inferior compression at both levels as seen by CT today. Posterior
elements of both levels appear spared.

Large infiltrating tumor of the right iliac bone with posterior
extraosseous extension (series 31, image 31 and series 26, image 1)
abuts the right SI joint. Smaller round left medial iliac bone
metastasis. Visible sacrum remains intact.

Conus medullaris: Extends to the T12-L1 level. No lower spinal cord
or conus signal abnormality. No abnormal intradural enhancement.
Normal cauda equina nerve roots. No lumbosacral dural thickening.

Paraspinal and other soft tissues: Severe bladder distension. Oval
metastatic left iliac lymph node up to 3.8 cm diameter (series 31,
image 38). Other abdominal and pelvic viscera as on CT today.

Disc levels:

No lumbar epidural or foraminal tumor. Disc and endplate
degeneration maximal at L3-L4 with mild degenerative foraminal
stenosis at that level.
IMPRESSION: 1. Widespread osseous metastatic disease in the thoracolumbar spine
and pelvis. Vertebral tumor with mild pathologic compression
fractures of T3, T6, L1, and L3.

2. Large, 2.5 cm epidural tumor extending from the T6 vertebra
occupies the right spinal canal at T5-T6 with Moderate To Severe
Cord Compression, And Mild Spinal Cord Edema. Associated near
complete occlusion of the right T6 neural foramen. And early
extraosseous tumor extension suspected from the right T1 facet.

3. No other thoracic epidural tumor or spinal stenosis.

4. Severe bladder distension. Large 3.8 cm metastatic left iliac
chain lymph node.

5. Bulky right posterior iliac wing tumor and extraosseous
extension.

## 2023-07-20 NOTE — Assessment & Plan Note (Signed)
stage IV with bone metastasis  -diagnosed in 08/2021, initial PSA 674. -He initially had T5-6, and a T6-7 laminectomy and resection of his thoracic tumor on August 19, 2021, status post radiation to thoracic spine in February 2023. -He has castration sensitive disease, on androgen deprivation therapy with Eligard 30mg  every 4 months and Zytiga or 1000 mg with prednisone 5 mg daily since March 2023 -He is also on Xgeva 120 mg every 8 weeks -He has had excellent response to treatment, PSA is undetectable now. -He is tolerating treatment well overall.   -He is clinically doing well, with minimal pain in back, otherwise doing very well. -Per patient's preference, we will continue follow-up every 2 months, Eligard every 4 months

## 2023-07-21 ENCOUNTER — Inpatient Hospital Stay: Payer: Commercial Managed Care - HMO | Attending: Oncology

## 2023-07-21 ENCOUNTER — Inpatient Hospital Stay (HOSPITAL_BASED_OUTPATIENT_CLINIC_OR_DEPARTMENT_OTHER): Payer: Commercial Managed Care - HMO | Admitting: Hematology

## 2023-07-21 ENCOUNTER — Inpatient Hospital Stay: Payer: Commercial Managed Care - HMO

## 2023-07-21 ENCOUNTER — Encounter: Payer: Self-pay | Admitting: Hematology

## 2023-07-21 VITALS — BP 143/97 | HR 76 | Temp 97.8°F | Resp 16 | Wt 195.8 lb

## 2023-07-21 DIAGNOSIS — Z79899 Other long term (current) drug therapy: Secondary | ICD-10-CM | POA: Insufficient documentation

## 2023-07-21 DIAGNOSIS — C61 Malignant neoplasm of prostate: Secondary | ICD-10-CM | POA: Insufficient documentation

## 2023-07-21 DIAGNOSIS — C7951 Secondary malignant neoplasm of bone: Secondary | ICD-10-CM

## 2023-07-21 DIAGNOSIS — Z6830 Body mass index (BMI) 30.0-30.9, adult: Secondary | ICD-10-CM | POA: Diagnosis not present

## 2023-07-21 DIAGNOSIS — E785 Hyperlipidemia, unspecified: Secondary | ICD-10-CM | POA: Diagnosis not present

## 2023-07-21 DIAGNOSIS — Z5111 Encounter for antineoplastic chemotherapy: Secondary | ICD-10-CM | POA: Diagnosis present

## 2023-07-21 DIAGNOSIS — E663 Overweight: Secondary | ICD-10-CM | POA: Diagnosis not present

## 2023-07-21 DIAGNOSIS — E876 Hypokalemia: Secondary | ICD-10-CM | POA: Diagnosis not present

## 2023-07-21 LAB — CBC WITH DIFFERENTIAL (CANCER CENTER ONLY)
Abs Immature Granulocytes: 0.01 10*3/uL (ref 0.00–0.07)
Basophils Absolute: 0 10*3/uL (ref 0.0–0.1)
Basophils Relative: 0 %
Eosinophils Absolute: 0.1 10*3/uL (ref 0.0–0.5)
Eosinophils Relative: 2 %
HCT: 41.2 % (ref 39.0–52.0)
Hemoglobin: 14.7 g/dL (ref 13.0–17.0)
Immature Granulocytes: 0 %
Lymphocytes Relative: 34 %
Lymphs Abs: 1.7 10*3/uL (ref 0.7–4.0)
MCH: 30.9 pg (ref 26.0–34.0)
MCHC: 35.7 g/dL (ref 30.0–36.0)
MCV: 86.7 fL (ref 80.0–100.0)
Monocytes Absolute: 0.6 10*3/uL (ref 0.1–1.0)
Monocytes Relative: 11 %
Neutro Abs: 2.6 10*3/uL (ref 1.7–7.7)
Neutrophils Relative %: 53 %
Platelet Count: 188 10*3/uL (ref 150–400)
RBC: 4.75 MIL/uL (ref 4.22–5.81)
RDW: 12 % (ref 11.5–15.5)
WBC Count: 5.1 10*3/uL (ref 4.0–10.5)
nRBC: 0 % (ref 0.0–0.2)

## 2023-07-21 LAB — CMP (CANCER CENTER ONLY)
ALT: 20 U/L (ref 0–44)
AST: 22 U/L (ref 15–41)
Albumin: 4.2 g/dL (ref 3.5–5.0)
Alkaline Phosphatase: 63 U/L (ref 38–126)
Anion gap: 7 (ref 5–15)
BUN: 16 mg/dL (ref 6–20)
CO2: 31 mmol/L (ref 22–32)
Calcium: 9.4 mg/dL (ref 8.9–10.3)
Chloride: 103 mmol/L (ref 98–111)
Creatinine: 1.09 mg/dL (ref 0.61–1.24)
GFR, Estimated: >60 mL/min (ref >60)
Glucose, Bld: 122 mg/dL — ABNORMAL HIGH (ref 70–99)
Potassium: 3.2 mmol/L — ABNORMAL LOW (ref 3.5–5.1)
Sodium: 141 mmol/L (ref 135–145)
Total Bilirubin: 0.8 mg/dL (ref <1.2)
Total Protein: 7.4 g/dL (ref 6.5–8.1)

## 2023-07-21 MED ORDER — LEUPROLIDE ACETATE (6 MONTH) 45 MG ~~LOC~~ KIT
45.0000 mg | PACK | Freq: Once | SUBCUTANEOUS | Status: AC
  Administered 2023-07-21: 45 mg via SUBCUTANEOUS
  Filled 2023-07-21: qty 45

## 2023-07-21 MED ORDER — DENOSUMAB 120 MG/1.7ML ~~LOC~~ SOLN
120.0000 mg | Freq: Once | SUBCUTANEOUS | Status: AC
  Administered 2023-07-21: 120 mg via SUBCUTANEOUS
  Filled 2023-07-21: qty 1.7

## 2023-07-21 MED ORDER — POTASSIUM CHLORIDE CRYS ER 20 MEQ PO TBCR: 20.0000 meq | EXTENDED_RELEASE_TABLET | Freq: Every day | ORAL | 0 refills | Status: DC
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
## 2023-07-22 ENCOUNTER — Other Ambulatory Visit (HOSPITAL_COMMUNITY): Payer: Self-pay

## 2023-07-22 LAB — PROSTATE-SPECIFIC AG, SERUM (LABCORP): Prostate Specific Ag, Serum: <0.1 ng/mL (ref 0.0–4.0)

## 2023-07-24 ENCOUNTER — Other Ambulatory Visit (HOSPITAL_COMMUNITY): Payer: Self-pay

## 2023-07-31 ENCOUNTER — Other Ambulatory Visit: Payer: Self-pay

## 2023-08-04 ENCOUNTER — Other Ambulatory Visit: Payer: Self-pay

## 2023-08-04 NOTE — Progress Notes (Signed)
Specialty Pharmacy Refill Coordination Note  Clifford Jenkins is a 62 y.o. male contacted today regarding refills of specialty medication(s) Abiraterone Acetate [name]   Patient requested Delivery   Delivery date: 08/14/23   Verified address: [address]   [place] [address]   Medication will be filled on 12.31.24.

## 2023-08-12 ENCOUNTER — Other Ambulatory Visit: Payer: Self-pay

## 2023-09-05 ENCOUNTER — Other Ambulatory Visit: Payer: Self-pay

## 2023-09-05 NOTE — Progress Notes (Signed)
Specialty Pharmacy Refill Coordination Note  Clifford Jenkins is a 63 y.o. male contacted today regarding refills of specialty medication(s) Abiraterone Acetate [name]   Patient requested Delivery   Delivery date: 09/12/23   Verified address: [address]   [place] [id number]   Medication will be filled on 01.30.25.

## 2023-09-07 ENCOUNTER — Other Ambulatory Visit: Payer: Self-pay | Admitting: Hematology

## 2023-09-08 ENCOUNTER — Encounter: Payer: Self-pay | Admitting: Hematology

## 2023-09-08 ENCOUNTER — Other Ambulatory Visit (HOSPITAL_COMMUNITY): Payer: Self-pay

## 2023-09-11 ENCOUNTER — Other Ambulatory Visit: Payer: Self-pay

## 2023-10-07 ENCOUNTER — Other Ambulatory Visit: Payer: Self-pay

## 2023-10-07 ENCOUNTER — Other Ambulatory Visit: Payer: Self-pay | Admitting: Hematology

## 2023-10-07 ENCOUNTER — Other Ambulatory Visit (HOSPITAL_COMMUNITY): Payer: Self-pay

## 2023-10-07 DIAGNOSIS — C7951 Secondary malignant neoplasm of bone: Secondary | ICD-10-CM

## 2023-10-07 MED ORDER — ABIRATERONE ACETATE 250 MG PO TABS
1000.0000 mg | ORAL_TABLET | Freq: Every day | ORAL | 2 refills | Status: DC
  Filled 2023-10-07: qty 120, 30d supply, fill #0

## 2023-10-07 NOTE — Progress Notes (Signed)
 Specialty Pharmacy Refill Coordination Note  [name] is a 63 y.o. male contacted today regarding refills of specialty medication(s) Abiraterone Acetate [name]   Patient requested Delivery   Delivery date: 10/14/23   Verified address: [address]   [name] [address]   Medication will be filled on 03.03.25.   This fill date is pending response to refill request from provider. Patient is aware and if they have not received fill by intended date they must follow up with pharmacy.

## 2023-10-08 ENCOUNTER — Other Ambulatory Visit (HOSPITAL_COMMUNITY): Payer: Self-pay

## 2023-10-08 ENCOUNTER — Other Ambulatory Visit: Payer: Self-pay

## 2023-10-10 ENCOUNTER — Other Ambulatory Visit: Payer: Self-pay | Admitting: Oncology

## 2023-10-10 DIAGNOSIS — C61 Malignant neoplasm of prostate: Secondary | ICD-10-CM

## 2023-10-12 NOTE — Progress Notes (Unsigned)
 Patient Care Team: Wilfrid Lund, PA as PCP - General (Family Medicine) Su Grand, MD (Inactive) as Attending Physician (Urology) Exie Parody, MD (Hematology and Oncology) Cherlyn Cushing, RN as Oncology Nurse Navigator Malachy Mood, MD as Attending Physician (Hematology and Oncology)  Clinic Day:  10/13/2023  Referring physician: Wilfrid Lund, PA  ASSESSMENT & PLAN:   Assessment & Plan: Patient is a 63 year old male with history of prostate cancer initially presented with de novo diffuse osseous metastases in 08/2021.  Patient started ADT with Eligard and abiraterone with prednisone and PSA has been undetectable.  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 Somatic: not yet   Hypertension Has been on amlodipine with valsartan and HCTZ combo.  10-320-25 mg BP at home reported stable.  Will continue  Hypokalemia Abdomen nonerythematous.  Has been prescribed potassium chloride 20 mEq daily Continue 20 mEq daily  Prostate cancer metastatic to bone (HCC) Continue AAP with ADT Repeat PSA every 3 months  Mixed hyperlipidemia Continue atorvastatin 40  mg daily No SE with Abi  At risk for side effect of medication Calcium 1200 mg and Vit D 1000 international units daily Bone density  Lower back pain Persistent with history of bone metastases. Obtain non-contrast CT lumbar spine for evaluation.   Total time 47 minutes.  The patient understands the plans discussed today and is in agreement with them.  He knows to contact our office if he develops concerns prior to his next appointment.  Melven Sartorius, MD  [facility] - A DEPT OF [facility] [address] [place] [address] Dept: [phone] Dept Fax: [phone]   Orders Placed This Encounter  Procedures   DG Bone Density    Standing Status:   Future    Expected Date:   10/27/2023    Expiration  Date:   10/12/2024    Reason for Exam (SYMPTOM  OR DIAGNOSIS REQUIRED):   bone density while on ADT for prostate cancer    Preferred imaging location?:   [address]   CT Lumbar Spine Wo Contrast    Standing Status:   Future    Expected Date:   10/20/2023    Expiration Date:   10/12/2024    Preferred imaging location?:   [facility]      CHIEF COMPLAINT:  CC: HSPC  Current Treatment:  AAP/ADT  INTERVAL HISTORY:  Clifford Jenkins is here today for repeat clinical assessment. Overall he is feeling well.  He reports that exercising regularly on a daily basis.  Report of persistent lower back pain that has not changed or improved.  His PSA has been 0.  He denies any weakness.  No other systemic symptoms.  I have reviewed the past medical history, past surgical history, social history and family history with the patient and they are unchanged from previous note.  ALLERGIES:  has no known allergies.  MEDICATIONS:  Current Outpatient Medications  Medication Sig Dispense Refill   abiraterone acetate (ZYTIGA) 250 MG tablet Take 4 tablets (1,000 mg total) by mouth daily. Take on an empty stomach 1 hour before or 2 hours after a meal 120 tablet 11   amLODIPine-Valsartan-HCTZ 10-320-25 MG TABS Take 1 tablet by mouth daily at 2 PM.     atorvastatin (LIPITOR) 40 MG tablet Take 1 tablet (40 mg total) by mouth daily. 90 tablet 1   calcium-vitamin D (OSCAL WITH D) 500-5  MG-MCG tablet Take 1 tablet by mouth 2 (two) times daily. 60 tablet 3   docusate sodium (COLACE) 100 MG capsule Take 1 capsule (100 mg total) by mouth 2 (two) times daily. 10 capsule 0   methocarbamol (ROBAXIN) 500 MG tablet Take 1 tablet (500 mg total) by mouth 2 (two) times daily. 30 tablet 0   polyethylene glycol (MIRALAX / GLYCOLAX) 17 g packet Take 17 g by mouth daily. 14 each 0   potassium chloride SA (KLOR-CON M20) 20 MEQ tablet Take 1 tablet (20 mEq total) by mouth daily. 90 tablet 0   predniSONE (DELTASONE) 5 MG tablet TAKE  1 TABLET BY MOUTH EVERY DAY WITH BREAKFAST 90 tablet 1   No current facility-administered medications for this visit.    HISTORY OF PRESENT ILLNESS:   Oncology History Overview Note   Cancer Staging  Prostate cancer metastatic to bone [facility]) Staging form: Prostate, AJCC 8th Edition - Clinical: Stage IVB (cTX, cNX, pM1c) - Signed by Benjiman Core, MD on 09/07/2021     Prostate cancer metastatic to bone [facility])  08/23/2021 Initial Diagnosis   Prostate cancer metastatic to bone [facility])   09/07/2021 Cancer Staging   Staging form: Prostate, AJCC 8th Edition - Clinical: Stage IVB (cTX, cNX, pM1c) - Signed by Benjiman Core, MD on 09/07/2021   06/05/2022 Genetic Testing   Negative genetics--[facility] CustomNext-Cancer +RNAinsight Panel.  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 Report date is 06/05/2022.    The CustomNext-Cancer+RNAinsight panel offered by Karna Dupes includes sequencing and rearrangement analysis for the following 47 genes:  APC, ATM, AXIN2, BARD1, BMPR1A, BRCA1, BRCA2, BRIP1, CDH1, CDK4, CDKN2A, CHEK2, DICER1, EPCAM, GREM1, HOXB13, MEN1, MLH1, MSH2, MSH3, MSH6, MUTYH, NBN, NF1, NF2, NTHL1, PALB2, PMS2, POLD1, POLE, PTEN, RAD51C, RAD51D, RECQL, RET, SDHA, SDHAF2, SDHB, SDHC, SDHD, SMAD4, SMARCA4, STK11, TP53, TSC1, TSC2, and VHL.  RNA data is routinely analyzed for use in variant interpretation for all genes.       REVIEW OF SYSTEMS:   All relevant systems were reviewed with the patient and are negative.   VITALS:  Blood pressure 125/88, pulse 74, temperature 97.9 F (36.6 C), temperature source Temporal, resp. rate 17, weight 192 lb 11.2 oz (87.4 kg), SpO2 98%.  Wt Readings from Last 3 Encounters:  10/13/23 192 lb 11.2 oz (87.4 kg)  07/21/23 195 lb 12.8 oz (88.8 kg)  05/19/23 187 lb 11.2 oz (85.1 kg)    Body mass index is 30.18 kg/m.  Performance status (ECOG): 1 - Symptomatic but completely ambulatory  PHYSICAL EXAM:   GENERAL:alert, no distress and  comfortable ABD: soft, non distended NEURO: No focal weakness  LABORATORY DATA:  I have reviewed the data as listed    Component Value Date/Time   NA 140 10/13/2023 1108   K 3.9 10/13/2023 1108   CL 104 10/13/2023 1108   CO2 30 10/13/2023 1108   GLUCOSE 116 (H) 10/13/2023 1108   BUN 10 10/13/2023 1108   CREATININE 1.02 10/13/2023 1108   CALCIUM 9.3 10/13/2023 1108   PROT 7.4 10/13/2023 1108   ALBUMIN 4.4 10/13/2023 1108   AST 30 10/13/2023 1108   ALT 24 10/13/2023 1108   ALKPHOS 68 10/13/2023 1108   BILITOT 0.9 10/13/2023 1108   GFRNONAA >60 10/13/2023 1108   GFRAA >60 05/23/2019 1042    No results found for: "SPEP", "UPEP"  Lab Results  Component Value Date   WBC 4.3 10/13/2023   NEUTROABS 1.8 10/13/2023   HGB 14.5 10/13/2023  HCT 42.6 10/13/2023   MCV 90.3 10/13/2023   PLT 173 10/13/2023      Chemistry      Component Value Date/Time   NA 140 10/13/2023 1108   K 3.9 10/13/2023 1108   CL 104 10/13/2023 1108   CO2 30 10/13/2023 1108   BUN 10 10/13/2023 1108   CREATININE 1.02 10/13/2023 1108      Component Value Date/Time   CALCIUM 9.3 10/13/2023 1108   ALKPHOS 68 10/13/2023 1108   AST 30 10/13/2023 1108   ALT 24 10/13/2023 1108   BILITOT 0.9 10/13/2023 1108       RADIOGRAPHIC STUDIES: I have personally reviewed the radiological images as listed and agreed with the findings in the report. No results found.

## 2023-10-13 ENCOUNTER — Inpatient Hospital Stay (HOSPITAL_BASED_OUTPATIENT_CLINIC_OR_DEPARTMENT_OTHER): Payer: BC Managed Care – PPO

## 2023-10-13 ENCOUNTER — Inpatient Hospital Stay: Payer: BC Managed Care – PPO | Attending: Oncology

## 2023-10-13 ENCOUNTER — Inpatient Hospital Stay: Payer: BC Managed Care – PPO

## 2023-10-13 ENCOUNTER — Other Ambulatory Visit: Payer: Self-pay

## 2023-10-13 ENCOUNTER — Telehealth: Payer: Self-pay | Admitting: Pharmacy Technician

## 2023-10-13 ENCOUNTER — Other Ambulatory Visit (HOSPITAL_COMMUNITY): Payer: Self-pay

## 2023-10-13 VITALS — BP 125/88 | HR 74 | Temp 97.9°F | Resp 17 | Wt 192.7 lb

## 2023-10-13 DIAGNOSIS — E876 Hypokalemia: Secondary | ICD-10-CM | POA: Insufficient documentation

## 2023-10-13 DIAGNOSIS — C61 Malignant neoplasm of prostate: Secondary | ICD-10-CM

## 2023-10-13 DIAGNOSIS — Z79899 Other long term (current) drug therapy: Secondary | ICD-10-CM | POA: Diagnosis not present

## 2023-10-13 DIAGNOSIS — I159 Secondary hypertension, unspecified: Secondary | ICD-10-CM

## 2023-10-13 DIAGNOSIS — M545 Low back pain, unspecified: Secondary | ICD-10-CM | POA: Insufficient documentation

## 2023-10-13 DIAGNOSIS — E782 Mixed hyperlipidemia: Secondary | ICD-10-CM

## 2023-10-13 DIAGNOSIS — Z9189 Other specified personal risk factors, not elsewhere classified: Secondary | ICD-10-CM

## 2023-10-13 DIAGNOSIS — I10 Essential (primary) hypertension: Secondary | ICD-10-CM | POA: Diagnosis not present

## 2023-10-13 DIAGNOSIS — C7951 Secondary malignant neoplasm of bone: Secondary | ICD-10-CM | POA: Diagnosis not present

## 2023-10-13 LAB — CMP (CANCER CENTER ONLY)
ALT: 24 U/L (ref 0–44)
AST: 30 U/L (ref 15–41)
Albumin: 4.4 g/dL (ref 3.5–5.0)
Alkaline Phosphatase: 68 U/L (ref 38–126)
Anion gap: 6 (ref 5–15)
BUN: 10 mg/dL (ref 6–20)
CO2: 30 mmol/L (ref 22–32)
Calcium: 9.3 mg/dL (ref 8.9–10.3)
Chloride: 104 mmol/L (ref 98–111)
Creatinine: 1.02 mg/dL (ref 0.61–1.24)
GFR, Estimated: >60 mL/min (ref >60)
Glucose, Bld: 116 mg/dL — ABNORMAL HIGH (ref 70–99)
Potassium: 3.9 mmol/L (ref 3.5–5.1)
Sodium: 140 mmol/L (ref 135–145)
Total Bilirubin: 0.9 mg/dL (ref 0.0–1.2)
Total Protein: 7.4 g/dL (ref 6.5–8.1)

## 2023-10-13 LAB — CBC WITH DIFFERENTIAL (CANCER CENTER ONLY)
Abs Immature Granulocytes: 0.01 10*3/uL (ref 0.00–0.07)
Basophils Absolute: 0 10*3/uL (ref 0.0–0.1)
Basophils Relative: 1 %
Eosinophils Absolute: 0.2 10*3/uL (ref 0.0–0.5)
Eosinophils Relative: 6 %
HCT: 42.6 % (ref 39.0–52.0)
Hemoglobin: 14.5 g/dL (ref 13.0–17.0)
Immature Granulocytes: 0 %
Lymphocytes Relative: 31 %
Lymphs Abs: 1.3 10*3/uL (ref 0.7–4.0)
MCH: 30.7 pg (ref 26.0–34.0)
MCHC: 34 g/dL (ref 30.0–36.0)
MCV: 90.3 fL (ref 80.0–100.0)
Monocytes Absolute: 0.9 10*3/uL (ref 0.1–1.0)
Monocytes Relative: 21 %
Neutro Abs: 1.8 10*3/uL (ref 1.7–7.7)
Neutrophils Relative %: 41 %
Platelet Count: 173 10*3/uL (ref 150–400)
RBC: 4.72 MIL/uL (ref 4.22–5.81)
RDW: 12.1 % (ref 11.5–15.5)
WBC Count: 4.3 10*3/uL (ref 4.0–10.5)
nRBC: 0 % (ref 0.0–0.2)

## 2023-10-13 MED ORDER — ABIRATERONE ACETATE 250 MG PO TABS
1000.0000 mg | ORAL_TABLET | Freq: Every day | ORAL | 11 refills | Status: AC
  Filled 2023-10-13: qty 120, 30d supply, fill #0
  Filled 2023-11-10: qty 120, 30d supply, fill #1
  Filled 2023-12-04: qty 120, 30d supply, fill #2
  Filled 2024-01-01: qty 120, 30d supply, fill #3
  Filled 2024-02-02: qty 120, 30d supply, fill #4
  Filled 2024-03-01 – 2024-03-08 (×3): qty 120, 30d supply, fill #5
  Filled 2024-04-07 – 2024-04-15 (×2): qty 120, 30d supply, fill #6
  Filled 2024-05-07 – 2024-05-10 (×2): qty 120, 30d supply, fill #7
  Filled 2024-06-09: qty 120, 30d supply, fill #8
  Filled 2024-07-07: qty 120, 30d supply, fill #9
  Filled 2024-08-06: qty 120, 30d supply, fill #10
  Filled 2024-09-09: qty 120, 30d supply, fill #11

## 2023-10-13 MED ORDER — POTASSIUM CHLORIDE CRYS ER 20 MEQ PO TBCR: 20.0000 meq | EXTENDED_RELEASE_TABLET | Freq: Every day | ORAL | 0 refills | Status: DC

## 2023-10-13 MED ORDER — DENOSUMAB 120 MG/1.7ML ~~LOC~~ SOLN
120.0000 mg | Freq: Once | SUBCUTANEOUS | Status: AC
  Administered 2023-10-13: 120 mg via SUBCUTANEOUS
  Filled 2023-10-13: qty 1.7

## 2023-10-13 NOTE — Assessment & Plan Note (Addendum)
 Abdomen nonerythematous.  Has been prescribed potassium chloride 20 mEq daily Continue 20 mEq daily

## 2023-10-13 NOTE — Assessment & Plan Note (Signed)
 Calcium 1200 mg and Vit D 1000 international units daily Bone density

## 2023-10-13 NOTE — Assessment & Plan Note (Signed)
 Persistent with history of bone metastases. Obtain non-contrast CT lumbar spine for evaluation.

## 2023-10-13 NOTE — Assessment & Plan Note (Signed)
 Has been on amlodipine with valsartan and HCTZ combo.  10-320-25 mg BP at home reported stable.  Will continue

## 2023-10-13 NOTE — Assessment & Plan Note (Signed)
 Continue AAP with ADT Repeat PSA every 3 months

## 2023-10-13 NOTE — Assessment & Plan Note (Signed)
 Continue atorvastatin 40  mg daily No SE with Vanice Sarah

## 2023-10-13 NOTE — Telephone Encounter (Signed)
 Oral Oncology Patient Advocate Encounter   Received notification that prior authorization for abiraterone is due for renewal.   PA submitted on 10/13/23 Submitted to [employer] via phone [phone] Case ID: [id number] Status is pending     Omer Jack, CPhT-Adv Oncology Pharmacy Patient Advocate [facility]  Direct Number: [phone]  Fax: [phone]

## 2023-10-13 NOTE — Telephone Encounter (Signed)
 Oral Oncology Patient Advocate Encounter  Prior Authorization for abiraterone has been approved.    PA# [id number] Effective dates: 10/13/23 through 10/12/24  Patients co-pay is $154.92.    Omer Jack, CPhT-Adv Oncology Pharmacy Patient Advocate [facility]  Direct Number: [phone]  Fax: [phone]

## 2023-10-14 ENCOUNTER — Other Ambulatory Visit: Payer: Self-pay

## 2023-10-14 ENCOUNTER — Telehealth: Payer: Self-pay

## 2023-10-14 LAB — PROSTATE-SPECIFIC AG, SERUM (LABCORP): Prostate Specific Ag, Serum: <0.1 ng/mL (ref 0.0–4.0)

## 2023-10-14 NOTE — Telephone Encounter (Signed)
 Marland Kitchen

## 2023-10-17 ENCOUNTER — Encounter: Payer: Self-pay | Admitting: Hematology

## 2023-10-21 ENCOUNTER — Ambulatory Visit (HOSPITAL_COMMUNITY)
Admission: RE | Admit: 2023-10-21 | Discharge: 2023-10-21 | Disposition: A | Discharge status: Home / Self Care | Source: Ambulatory Visit

## 2023-10-21 ENCOUNTER — Encounter (HOSPITAL_COMMUNITY): Payer: Self-pay

## 2023-10-21 DIAGNOSIS — M545 Low back pain, unspecified: Secondary | ICD-10-CM

## 2023-10-21 DIAGNOSIS — C7951 Secondary malignant neoplasm of bone: Secondary | ICD-10-CM

## 2023-10-21 DIAGNOSIS — C61 Malignant neoplasm of prostate: Secondary | ICD-10-CM | POA: Insufficient documentation

## 2023-11-03 ENCOUNTER — Other Ambulatory Visit (HOSPITAL_COMMUNITY): Payer: Self-pay

## 2023-11-06 ENCOUNTER — Other Ambulatory Visit (HOSPITAL_COMMUNITY): Payer: Self-pay

## 2023-11-10 ENCOUNTER — Other Ambulatory Visit: Payer: Self-pay

## 2023-11-10 NOTE — Progress Notes (Signed)
 Specialty Pharmacy Refill Coordination Note  Clifford Jenkins is a 63 y.o. male contacted today regarding refills of specialty medication(s) Abiraterone Acetate [name]   Patient requested Delivery   Delivery date: 11/13/23   Verified address: [address]   [place] [address]   Medication will be filled on 04.02.25.

## 2023-12-04 ENCOUNTER — Other Ambulatory Visit: Payer: Self-pay

## 2023-12-04 NOTE — Progress Notes (Signed)
 Specialty Pharmacy Refill Coordination Note  Clifford Jenkins is a 63 y.o. male contacted today regarding refills of specialty medication(s) Abiraterone  Acetate (ZYTIGA )   Patient requested Delivery   Delivery date: 12/12/23   Verified address: [address]   [place] [address]   Medication will be filled on 12/11/23.

## 2023-12-11 ENCOUNTER — Other Ambulatory Visit: Payer: Self-pay

## 2024-01-01 ENCOUNTER — Other Ambulatory Visit: Payer: Self-pay

## 2024-01-01 ENCOUNTER — Other Ambulatory Visit (HOSPITAL_COMMUNITY): Payer: Self-pay

## 2024-01-01 NOTE — Progress Notes (Signed)
 Specialty Pharmacy Refill Coordination Note  Clifford Jenkins is a 63 y.o. male contacted today regarding refills of specialty medication(s) Abiraterone  Acetate (ZYTIGA )   Patient requested Delivery   Delivery date: 01/14/24   Verified address: [address]   [place] [address]   Medication will be filled on 01/13/24.

## 2024-01-01 NOTE — Progress Notes (Signed)
 Specialty Pharmacy Ongoing Clinical Assessment Note  Clifford Jenkins is a 63 y.o. male who is being followed by the specialty pharmacy service for RxSp Oncology   Patient's specialty medication(s) reviewed today: Abiraterone  Acetate (ZYTIGA )   Missed doses in the last 4 weeks: 0   Patient/Caregiver did not have any additional questions or concerns.   Therapeutic benefit summary: Patient is achieving benefit   Adverse events/side effects summary: No adverse events/side effects   Patient's therapy is appropriate to: Continue    Goals Addressed             This Visit's Progress    Slow Disease Progression   On track    Patient is on track. Patient will maintain adherence. PSA remains undetectable as of 05/19/23 labs.          Follow up: 6 months  [facility]

## 2024-01-09 NOTE — Assessment & Plan Note (Addendum)
 Calcium  1200 mg and Vit D 1000 international units daily Bone density. Gave # to call.

## 2024-01-09 NOTE — Assessment & Plan Note (Addendum)
 Continue atorvastatin 40  mg daily No SE with Clifford Jenkins

## 2024-01-09 NOTE — Assessment & Plan Note (Addendum)
 Has been on amlodipine with valsartan and HCTZ combo.  10-320-25 mg BP at home reported stable.  Will continue

## 2024-01-09 NOTE — Assessment & Plan Note (Addendum)
 Continue AAP with ADT Last ADT was Eligard  45 mg in Dec. Refilled prednisone  today Repeat PSA every 3 months

## 2024-01-09 NOTE — Progress Notes (Signed)
 [facility] OFFICE PROGRESS NOTE  Patient Care Team: Sinda Duel, PA as PCP - General (Family Medicine) Jock Muller, MD (Inactive) as Attending Physician (Urology) Marlyne Sing, MD (Hematology and Oncology) Katheleen Palmer, RN as Oncology Nurse Navigator Sonja Mountrail, MD as Attending Physician (Hematology and Oncology)  Patient is a 63 year old male with history of prostate cancer initially presented with de novo diffuse osseous metastases in 08/2021.  Patient started ADT with Eligard  and abiraterone  with prednisone  and PSA has been undetectable.   He has been on denosumab  prior to seeing me. Recommend obtain dexa scan. If normal may consider stop with one dose of zometa.  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 Somatic: not yet Assessment & Plan Prostate cancer metastatic to bone (HCC) Continue AAP with ADT Last ADT was Eligard  45 mg in Dec. Refilled prednisone  today Repeat PSA every 3 months At risk for side effect of medication Calcium  1200 mg and Vit D 1000 international units daily Bone density. Gave # to call. Secondary hypertension Has been on amlodipine  with valsartan  and HCTZ combo.  10-320-25 mg BP at home reported stable.  Will continue Mixed hyperlipidemia Continue atorvastatin  40  mg daily No SE with Abi Hypokalemia Refilled  potassium chloride  20 mEq daily Continue 20 mEq daily   Lowanda Ruddy, MD  INTERVAL HISTORY: Patient returns for follow-up. Appetite is good. No new symptoms. Exercises regularly. No diarrhea.  No dental procedure needed.  Oncology History Overview Note   Cancer Staging  Prostate cancer metastatic to bone [facility]) Staging form: Prostate, AJCC 8th Edition - Clinical: Stage IVB (cTX, cNX, pM1c) - Signed by Renna Cary, MD on 09/07/2021     Prostate cancer metastatic to bone [facility])  08/23/2021 Initial Diagnosis   Prostate cancer metastatic to bone [facility])    09/07/2021 Cancer Staging   Staging form: Prostate, AJCC 8th Edition - Clinical: Stage IVB (cTX, cNX, pM1c) - Signed by Renna Cary, MD on 09/07/2021   06/05/2022 Genetic Testing   Negative genetics--[facility] CustomNext-Cancer +RNAinsight Panel.  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 Report date is 06/05/2022.    The CustomNext-Cancer+RNAinsight panel offered by Levi Real includes sequencing and rearrangement analysis for the following 47 genes:  APC, ATM, AXIN2, BARD1, BMPR1A, BRCA1, BRCA2, BRIP1, CDH1, CDK4, CDKN2A, CHEK2, DICER1, EPCAM, GREM1, HOXB13, MEN1, MLH1, MSH2, MSH3, MSH6, MUTYH, NBN, NF1, NF2, NTHL1, PALB2, PMS2, POLD1, POLE, PTEN, RAD51C, RAD51D, RECQL, RET, SDHA, SDHAF2, SDHB, SDHC, SDHD, SMAD4, SMARCA4, STK11, TP53, TSC1, TSC2, and VHL.  RNA data is routinely analyzed for use in variant interpretation for all genes.      PHYSICAL EXAMINATION: ECOG PERFORMANCE STATUS: 0 - Asymptomatic  Vitals:   01/13/24 1319  BP: 127/86  Pulse: 74  Resp: 17  Temp: (!) 97.5 F (36.4 C)  SpO2: 99%   Filed Weights   01/13/24 1319  Weight: 187 lb (84.8 kg)    GENERAL: alert, no distress and comfortable SKIN: skin color normal  LUNGS: clear to auscultation and percussion with normal breathing effort HEART: regular rate & rhythm  ABDOMEN: abdomen soft, non-tender and nondistended. Musculoskeletal: no edema   Relevant data reviewed during this visit included labs.

## 2024-01-12 ENCOUNTER — Other Ambulatory Visit: Payer: Self-pay

## 2024-01-12 DIAGNOSIS — E876 Hypokalemia: Secondary | ICD-10-CM

## 2024-01-13 ENCOUNTER — Other Ambulatory Visit: Payer: Self-pay

## 2024-01-13 ENCOUNTER — Encounter: Payer: Self-pay | Admitting: Hematology

## 2024-01-13 ENCOUNTER — Ambulatory Visit

## 2024-01-13 ENCOUNTER — Inpatient Hospital Stay: Attending: Oncology

## 2024-01-13 ENCOUNTER — Inpatient Hospital Stay

## 2024-01-13 VITALS — BP 127/86 | HR 74 | Temp 97.5°F | Resp 17 | Ht 67.0 in | Wt 187.0 lb

## 2024-01-13 DIAGNOSIS — C61 Malignant neoplasm of prostate: Secondary | ICD-10-CM | POA: Insufficient documentation

## 2024-01-13 DIAGNOSIS — Z9189 Other specified personal risk factors, not elsewhere classified: Secondary | ICD-10-CM

## 2024-01-13 DIAGNOSIS — C7951 Secondary malignant neoplasm of bone: Secondary | ICD-10-CM | POA: Diagnosis not present

## 2024-01-13 DIAGNOSIS — E876 Hypokalemia: Secondary | ICD-10-CM | POA: Diagnosis not present

## 2024-01-13 DIAGNOSIS — Z5111 Encounter for antineoplastic chemotherapy: Secondary | ICD-10-CM | POA: Diagnosis present

## 2024-01-13 DIAGNOSIS — Z79899 Other long term (current) drug therapy: Secondary | ICD-10-CM | POA: Insufficient documentation

## 2024-01-13 DIAGNOSIS — E782 Mixed hyperlipidemia: Secondary | ICD-10-CM | POA: Diagnosis not present

## 2024-01-13 DIAGNOSIS — I159 Secondary hypertension, unspecified: Secondary | ICD-10-CM | POA: Diagnosis not present

## 2024-01-13 LAB — CBC WITH DIFFERENTIAL (CANCER CENTER ONLY)
Abs Immature Granulocytes: 0.01 10*3/uL (ref 0.00–0.07)
Basophils Absolute: 0 10*3/uL (ref 0.0–0.1)
Basophils Relative: 1 %
Eosinophils Absolute: 0.1 10*3/uL (ref 0.0–0.5)
Eosinophils Relative: 3 %
HCT: 39.3 % (ref 39.0–52.0)
Hemoglobin: 14 g/dL (ref 13.0–17.0)
Immature Granulocytes: 0 %
Lymphocytes Relative: 31 %
Lymphs Abs: 1.6 10*3/uL (ref 0.7–4.0)
MCH: 30.9 pg (ref 26.0–34.0)
MCHC: 35.6 g/dL (ref 30.0–36.0)
MCV: 86.8 fL (ref 80.0–100.0)
Monocytes Absolute: 0.6 10*3/uL (ref 0.1–1.0)
Monocytes Relative: 12 %
Neutro Abs: 2.7 10*3/uL (ref 1.7–7.7)
Neutrophils Relative %: 53 %
Platelet Count: 176 10*3/uL (ref 150–400)
RBC: 4.53 MIL/uL (ref 4.22–5.81)
RDW: 12.3 % (ref 11.5–15.5)
WBC Count: 5.1 10*3/uL (ref 4.0–10.5)
nRBC: 0 % (ref 0.0–0.2)

## 2024-01-13 LAB — CMP (CANCER CENTER ONLY)
ALT: 20 U/L (ref 0–44)
AST: 24 U/L (ref 15–41)
Albumin: 4.5 g/dL (ref 3.5–5.0)
Alkaline Phosphatase: 60 U/L (ref 38–126)
Anion gap: 6 (ref 5–15)
BUN: 13 mg/dL (ref 6–20)
CO2: 29 mmol/L (ref 22–32)
Calcium: 9.3 mg/dL (ref 8.9–10.3)
Chloride: 109 mmol/L (ref 98–111)
Creatinine: 0.99 mg/dL (ref 0.61–1.24)
GFR, Estimated: >60 mL/min (ref >60)
Glucose, Bld: 99 mg/dL (ref 70–99)
Potassium: 3.3 mmol/L — ABNORMAL LOW (ref 3.5–5.1)
Sodium: 144 mmol/L (ref 135–145)
Total Bilirubin: 0.8 mg/dL (ref 0.0–1.2)
Total Protein: 7.5 g/dL (ref 6.5–8.1)

## 2024-01-13 MED ORDER — PREDNISONE 5 MG PO TABS: ORAL_TABLET | ORAL | 1 refills | Status: DC

## 2024-01-13 MED ORDER — POTASSIUM CHLORIDE CRYS ER 20 MEQ PO TBCR: 20.0000 meq | EXTENDED_RELEASE_TABLET | Freq: Every day | ORAL | 2 refills | Status: DC

## 2024-01-13 NOTE — Assessment & Plan Note (Addendum)
 Refilled  potassium chloride  20 mEq daily Continue 20 mEq daily

## 2024-01-14 ENCOUNTER — Other Ambulatory Visit: Payer: Self-pay

## 2024-01-14 ENCOUNTER — Inpatient Hospital Stay

## 2024-01-14 ENCOUNTER — Ambulatory Visit: Payer: Self-pay

## 2024-01-14 VITALS — BP 129/87 | HR 75 | Temp 98.9°F | Resp 14

## 2024-01-14 DIAGNOSIS — Z5111 Encounter for antineoplastic chemotherapy: Secondary | ICD-10-CM | POA: Diagnosis not present

## 2024-01-14 DIAGNOSIS — C61 Malignant neoplasm of prostate: Secondary | ICD-10-CM

## 2024-01-14 LAB — TESTOSTERONE: Testosterone: <3 ng/dL — ABNORMAL LOW (ref 264–916)

## 2024-01-14 LAB — PROSTATE-SPECIFIC AG, SERUM (LABCORP): Prostate Specific Ag, Serum: <0.1 ng/mL (ref 0.0–4.0)

## 2024-01-14 MED ORDER — LEUPROLIDE ACETATE (4 MONTH) 30 MG IM KIT
30.0000 mg | PACK | Freq: Once | INTRAMUSCULAR | Status: AC
  Administered 2024-01-14: 30 mg via INTRAMUSCULAR
  Filled 2024-01-14: qty 30

## 2024-01-14 MED ORDER — DENOSUMAB 120 MG/1.7ML ~~LOC~~ SOLN
120.0000 mg | Freq: Once | SUBCUTANEOUS | Status: AC
  Administered 2024-01-14: 120 mg via SUBCUTANEOUS
  Filled 2024-01-14: qty 1.7

## 2024-01-14 NOTE — Telephone Encounter (Signed)
-----   Message from Lowanda Ruddy sent at 01/14/2024  1:53 PM EDT ----- Mardy Shall please let him know good news. PSA remains < 0.1. follow up in about 3 months as planned. No changes. Thanks.

## 2024-01-14 NOTE — Telephone Encounter (Signed)
 Called pt to relay message from Dr. Alita Irwin from below. Was not able to reach pt and not able to leave voice message. No answer from significant other as well.

## 2024-01-15 ENCOUNTER — Telehealth: Payer: Self-pay | Admitting: *Deleted

## 2024-01-15 NOTE — Telephone Encounter (Signed)
 Notified of PSA results. To follow up in 3 months as scheduled

## 2024-01-27 ENCOUNTER — Other Ambulatory Visit: Payer: Self-pay

## 2024-01-27 MED ORDER — XGEVA 120 MG/1.7ML ~~LOC~~ SOLN: 120.0000 mg | SUBCUTANEOUS | 1 refills | Status: AC

## 2024-01-27 NOTE — Progress Notes (Signed)
 This nurse received a Secure Chat message from Alexis Anger on the [employer] Cycle Team stating the followingSonja [place] @ [phone] [employer] nurse case manager advised xgeva  will need to go through speciality pharmacy[facility] at home since patient has [employer] pathwell   LVM for Elaina to give this nurse a call regarding pt's Xgeva  Injections.  Stated the prescription has been sent to [facility] as requested.  Instructed Elaina to contact [facility] regarding faxing Dr. Candise Chambers office the nursing orders to be signed for home administration of Xgeva .  Awaiting return call from [facility] Nurse Case w/[employer].  Sent prescription for Xgeva  to [facility].  Spoke with Dr. Alita Irwin and his nurse Baldomero Bone, RN regarding Secure Chat from [employer].  Pt was a former pt of Dr. Candise Chambers and is now under the care of Dr. Alita Irwin.  Pt has a f/u appt in Sept 2025 at which time Dr. Alita Irwin stated he may discuss changing the pt from Xgeva  to Zoledronic Acid infusions.

## 2024-02-02 ENCOUNTER — Other Ambulatory Visit: Payer: Self-pay

## 2024-02-02 NOTE — Progress Notes (Signed)
 Specialty Pharmacy Refill Coordination Note  Clifford Jenkins is a 63 y.o. male contacted today regarding refills of specialty medication(s) Abiraterone  Acetate (ZYTIGA )   Patient requested Delivery   Delivery date: 02/11/24   Verified address: [address]   [place] [address]   Medication will be filled on 02/10/24.

## 2024-02-20 ENCOUNTER — Other Ambulatory Visit: Payer: Self-pay

## 2024-03-01 ENCOUNTER — Other Ambulatory Visit: Payer: Self-pay

## 2024-03-01 ENCOUNTER — Encounter: Payer: Self-pay | Admitting: Hematology

## 2024-03-02 ENCOUNTER — Encounter: Payer: Self-pay | Admitting: Hematology

## 2024-03-02 ENCOUNTER — Telehealth: Payer: Self-pay

## 2024-03-02 ENCOUNTER — Other Ambulatory Visit (HOSPITAL_COMMUNITY): Payer: Self-pay

## 2024-03-02 ENCOUNTER — Other Ambulatory Visit: Payer: Self-pay

## 2024-03-02 NOTE — Telephone Encounter (Signed)
 Oral Oncology Patient Advocate Encounter  Was successful in securing patient a grant from [employer] to provide copayment coverage for Zytiga .  This will keep the out of pocket expense at $0.    I have spoken with the patient.   The billing information is as follows and has been shared with [facility].    RxBin: [id number] PCN: AS Member ID: [id number] Group ID: [id number] Dates of Eligibility: 03/02/24 through 04/01/24  Fund:  Prostate Cancer   Charlott Hamilton,  CPhT-Adv  she/her/hers [facility]  [facility] Specialty Pharmacy Services Pharmacy Technician Patient Advocate Specialist III [facility] Phone: [phone]  Fax: [phone] [email or web address]

## 2024-03-08 ENCOUNTER — Other Ambulatory Visit (HOSPITAL_COMMUNITY): Payer: Self-pay

## 2024-03-09 ENCOUNTER — Other Ambulatory Visit (HOSPITAL_COMMUNITY): Payer: Self-pay

## 2024-03-10 ENCOUNTER — Other Ambulatory Visit (HOSPITAL_COMMUNITY): Payer: Self-pay

## 2024-03-12 ENCOUNTER — Other Ambulatory Visit: Payer: Self-pay

## 2024-03-12 NOTE — Progress Notes (Signed)
 Specialty Pharmacy Refill Coordination Note  Clifford Jenkins is a 63 y.o. male contacted today regarding refills of specialty medication(s) Abiraterone  Acetate (ZYTIGA )   Patient requested Delivery   Delivery date: 03/16/24   Verified address: [address]   [place] [address]   Medication will be filled on 08.04.25.

## 2024-03-22 DIAGNOSIS — I700 Atherosclerosis of aorta: Secondary | ICD-10-CM | POA: Diagnosis not present

## 2024-03-22 DIAGNOSIS — I10 Essential (primary) hypertension: Secondary | ICD-10-CM | POA: Diagnosis not present

## 2024-03-22 DIAGNOSIS — I2510 Atherosclerotic heart disease of native coronary artery without angina pectoris: Secondary | ICD-10-CM | POA: Diagnosis not present

## 2024-03-22 DIAGNOSIS — E782 Mixed hyperlipidemia: Secondary | ICD-10-CM | POA: Diagnosis not present

## 2024-03-22 DIAGNOSIS — C7951 Secondary malignant neoplasm of bone: Secondary | ICD-10-CM | POA: Diagnosis not present

## 2024-04-07 ENCOUNTER — Other Ambulatory Visit: Payer: Self-pay

## 2024-04-08 ENCOUNTER — Other Ambulatory Visit (HOSPITAL_COMMUNITY): Payer: Self-pay

## 2024-04-08 ENCOUNTER — Encounter: Payer: Self-pay | Admitting: Hematology

## 2024-04-09 ENCOUNTER — Other Ambulatory Visit (HOSPITAL_COMMUNITY): Payer: Self-pay

## 2024-04-14 ENCOUNTER — Other Ambulatory Visit (HOSPITAL_COMMUNITY): Payer: Self-pay

## 2024-04-15 ENCOUNTER — Inpatient Hospital Stay: Attending: Oncology

## 2024-04-15 ENCOUNTER — Telehealth: Payer: Self-pay

## 2024-04-15 ENCOUNTER — Other Ambulatory Visit: Payer: Self-pay

## 2024-04-15 ENCOUNTER — Inpatient Hospital Stay

## 2024-04-15 VITALS — BP 128/81 | HR 70 | Temp 97.9°F | Resp 17 | Wt 189.3 lb

## 2024-04-15 DIAGNOSIS — Z79818 Long term (current) use of other agents affecting estrogen receptors and estrogen levels: Secondary | ICD-10-CM | POA: Insufficient documentation

## 2024-04-15 DIAGNOSIS — I159 Secondary hypertension, unspecified: Secondary | ICD-10-CM | POA: Diagnosis not present

## 2024-04-15 DIAGNOSIS — Z79899 Other long term (current) drug therapy: Secondary | ICD-10-CM | POA: Insufficient documentation

## 2024-04-15 DIAGNOSIS — Z9189 Other specified personal risk factors, not elsewhere classified: Secondary | ICD-10-CM

## 2024-04-15 DIAGNOSIS — E876 Hypokalemia: Secondary | ICD-10-CM | POA: Insufficient documentation

## 2024-04-15 DIAGNOSIS — C61 Malignant neoplasm of prostate: Secondary | ICD-10-CM

## 2024-04-15 DIAGNOSIS — E782 Mixed hyperlipidemia: Secondary | ICD-10-CM | POA: Insufficient documentation

## 2024-04-15 DIAGNOSIS — C7951 Secondary malignant neoplasm of bone: Secondary | ICD-10-CM

## 2024-04-15 LAB — CMP (CANCER CENTER ONLY)
ALT: 19 U/L (ref 0–44)
AST: 23 U/L (ref 15–41)
Albumin: 4.4 g/dL (ref 3.5–5.0)
Alkaline Phosphatase: 61 U/L (ref 38–126)
Anion gap: 7 (ref 5–15)
BUN: 13 mg/dL (ref 6–20)
CO2: 28 mmol/L (ref 22–32)
Calcium: 9.9 mg/dL (ref 8.9–10.3)
Chloride: 107 mmol/L (ref 98–111)
Creatinine: 1.11 mg/dL (ref 0.61–1.24)
GFR, Estimated: >60 mL/min (ref >60)
Glucose, Bld: 100 mg/dL — ABNORMAL HIGH (ref 70–99)
Potassium: 3.3 mmol/L — ABNORMAL LOW (ref 3.5–5.1)
Sodium: 142 mmol/L (ref 135–145)
Total Bilirubin: 0.7 mg/dL (ref 0.0–1.2)
Total Protein: 7.4 g/dL (ref 6.5–8.1)

## 2024-04-15 LAB — CBC WITH DIFFERENTIAL (CANCER CENTER ONLY)
Abs Immature Granulocytes: 0.02 K/uL (ref 0.00–0.07)
Basophils Absolute: 0 K/uL (ref 0.0–0.1)
Basophils Relative: 1 %
Eosinophils Absolute: 0.1 K/uL (ref 0.0–0.5)
Eosinophils Relative: 2 %
HCT: 41.1 % (ref 39.0–52.0)
Hemoglobin: 14.5 g/dL (ref 13.0–17.0)
Immature Granulocytes: 0 %
Lymphocytes Relative: 35 %
Lymphs Abs: 1.9 K/uL (ref 0.7–4.0)
MCH: 30.4 pg (ref 26.0–34.0)
MCHC: 35.3 g/dL (ref 30.0–36.0)
MCV: 86.2 fL (ref 80.0–100.0)
Monocytes Absolute: 0.6 K/uL (ref 0.1–1.0)
Monocytes Relative: 11 %
Neutro Abs: 2.7 K/uL (ref 1.7–7.7)
Neutrophils Relative %: 51 %
Platelet Count: 155 K/uL (ref 150–400)
RBC: 4.77 MIL/uL (ref 4.22–5.81)
RDW: 12.1 % (ref 11.5–15.5)
WBC Count: 5.3 K/uL (ref 4.0–10.5)
nRBC: 0 % (ref 0.0–0.2)

## 2024-04-15 MED ORDER — POTASSIUM CHLORIDE CRYS ER 20 MEQ PO TBCR: 20.0000 meq | EXTENDED_RELEASE_TABLET | Freq: Every day | ORAL | 2 refills | Status: DC

## 2024-04-15 MED ORDER — PREDNISONE 5 MG PO TABS: ORAL_TABLET | ORAL | 1 refills | Status: DC

## 2024-04-15 NOTE — Assessment & Plan Note (Addendum)
 Has been on amlodipine with valsartan and HCTZ combo.  10-320-25 mg BP at home reported stable.  Will continue

## 2024-04-15 NOTE — Progress Notes (Signed)
 [facility] OFFICE PROGRESS NOTE  Patient Care Team: Alben Therisa MATSU, PA as PCP - General (Family Medicine) Aleene Kitchens, MD (Inactive) as Attending Physician (Urology) Twana Jeneal DASEN, MD (Hematology and Oncology) Vertell Pont, RN as Oncology Nurse Navigator Tina Pauletta BROCKS, MD as Consulting Physician (Hematology and Oncology)  Patient is a 63 year old male with history of prostate cancer initially presented with de novo diffuse osseous metastases in 08/2021. Patient started ADT with Eligard  and abiraterone  with prednisone  and PSA has been undetectable.   He has been on denosumab  prior to seeing me. Recommend obtain dexa scan. If normal may consider stop with one dose of zometa.   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 Somatic: not yet Assessment & Plan Prostate cancer metastatic to bone (HCC) Continue AAP with ADT Last ADT was Eligard  45 mg in Dec. Repeat PSA every 3 months Hypokalemia Refilled  potassium chloride  20 mEq daily Continue 20 mEq daily Secondary hypertension Has been on amlodipine  with valsartan  and HCTZ combo.  10-320-25 mg BP at home reported stable.  Will continue Mixed hyperlipidemia Continue atorvastatin  40  mg daily No SE with Abi At risk for side effect of medication Calcium  1200 mg and Vit D 1000 international units daily Bone density. Will see if can be done at [facility] Will give a dose of Zometa next visit.  If having osteopenia, will continue to Zometa. Continue routine dental care.  Orders Placed This Encounter  Procedures   DG Bone Density    Standing Status:   Future    Expected Date:   05/15/2024    Expiration Date:   04/15/2025    Reason for Exam (SYMPTOM  OR DIAGNOSIS REQUIRED):   [facility] for bone density    Preferred imaging location?:   External     Pauletta BROCKS Tina, MD  INTERVAL HISTORY: Patient returns for follow-up.  Overall feeling well.  Exercise, no restrictions.  No chest  pain, bone pain, new back pain.  Reports taking his medication as prescribed.  Oncology History Overview Note   Cancer Staging  Prostate cancer metastatic to bone [facility]) Staging form: Prostate, AJCC 8th Edition - Clinical: Stage IVB (cTX, cNX, pM1c) - Signed by Amadeo Windell SAILOR, MD on 09/07/2021     Prostate cancer metastatic to bone [facility])  08/23/2021 Initial Diagnosis   Prostate cancer metastatic to bone [facility])   09/07/2021 Cancer Staging   Staging form: Prostate, AJCC 8th Edition - Clinical: Stage IVB (cTX, cNX, pM1c) - Signed by Amadeo Windell SAILOR, MD on 09/07/2021   06/05/2022 Genetic Testing   Negative genetics--[facility] CustomNext-Cancer +RNAinsight Panel.  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 Report date is 06/05/2022.    The CustomNext-Cancer+RNAinsight panel offered by Vaughn Banker includes sequencing and rearrangement analysis for the following 47 genes:  APC, ATM, AXIN2, BARD1, BMPR1A, BRCA1, BRCA2, BRIP1, CDH1, CDK4, CDKN2A, CHEK2, DICER1, EPCAM, GREM1, HOXB13, MEN1, MLH1, MSH2, MSH3, MSH6, MUTYH, NBN, NF1, NF2, NTHL1, PALB2, PMS2, POLD1, POLE, PTEN, RAD51C, RAD51D, RECQL, RET, SDHA, SDHAF2, SDHB, SDHC, SDHD, SMAD4, SMARCA4, STK11, TP53, TSC1, TSC2, and VHL.  RNA data is routinely analyzed for use in variant interpretation for all genes.      PHYSICAL EXAMINATION: ECOG PERFORMANCE STATUS: 0 - Asymptomatic  Vitals:   04/15/24 1417  BP: 128/81  Pulse: 70  Resp: 17  Temp: 97.9 F (36.6 C)  SpO2: 99%   Filed Weights   04/15/24 1417  Weight: 189 lb 4.8 oz (  85.9 kg)    GENERAL: alert, no distress and comfortable LUNGS: clear to auscultation and percussion with normal breathing effort HEART: regular rate & rhythm  ABDOMEN: abdomen soft, non-tender and nondistended. Musculoskeletal: no edema   Relevant data reviewed during this visit included labs.

## 2024-04-15 NOTE — Telephone Encounter (Signed)
 Orders for a bone density faxed to [facility].  Confirmation received

## 2024-04-15 NOTE — Assessment & Plan Note (Addendum)
 Calcium  1200 mg and Vit D 1000 international units daily Bone density. Will see if can be done at [facility] Will give a dose of Zometa next visit.  If having osteopenia, will continue to Zometa. Continue routine dental care.

## 2024-04-15 NOTE — Assessment & Plan Note (Addendum)
 Continue atorvastatin 40  mg daily No SE with Clifford Jenkins

## 2024-04-15 NOTE — Assessment & Plan Note (Addendum)
 Refilled  potassium chloride  20 mEq daily Continue 20 mEq daily

## 2024-04-15 NOTE — Progress Notes (Signed)
 Specialty Pharmacy Refill Coordination Note  Clifford Jenkins is a 63 y.o. male contacted today regarding refills of specialty medication(s) Abiraterone  Acetate (ZYTIGA )   Patient requested [facility] at [facility] [place] date: 04/16/24   Medication will be filled on 04/15/24.

## 2024-04-15 NOTE — Assessment & Plan Note (Addendum)
 Continue AAP with ADT Last ADT was Eligard  45 mg in Dec. Repeat PSA every 3 months

## 2024-04-16 ENCOUNTER — Ambulatory Visit: Payer: Self-pay

## 2024-04-16 LAB — PROSTATE-SPECIFIC AG, SERUM (LABCORP): Prostate Specific Ag, Serum: <0.1 ng/mL (ref 0.0–4.0)

## 2024-04-16 LAB — TESTOSTERONE: Testosterone: <3 ng/dL — ABNORMAL LOW (ref 264–916)

## 2024-04-19 ENCOUNTER — Telehealth: Payer: Self-pay

## 2024-04-19 ENCOUNTER — Encounter: Payer: Self-pay | Admitting: Hematology

## 2024-04-19 NOTE — Telephone Encounter (Signed)
-----   Message from Pauletta JAYSON Chihuahua sent at 04/19/2024  9:19 AM EDT ----- Erskin Tyrik Stetzer Would you call again and make sure he is taking potassium daily. Thanks.  ----- Message ----- From: Marget Rudell ORN, RN Sent: 04/16/2024   3:27 PM EDT To: Pauletta JAYSON Chihu[place]ua, MD; [facility]  I attempted calling him, there was no answer & recording said the call could not be completed at the time. ----- Message ----- From: Chihuahua Pauletta JAYSON, MD Sent: 04/16/2024   1:51 PM EDT To: Rudell ORN Marget, RN  Please let him know PSA is still less than 0.1.  There is good news.  No changes from treatment plan thank you. ----- Message ----- From: Interface, Lab In [place] Sent: 04/15/2024   1:50 PM EDT To: Pauletta JAYSON Chihuahua, MD

## 2024-04-19 NOTE — Telephone Encounter (Signed)
 Clifford Jenkins states he is taking 1 potassium tablet daily.

## 2024-04-19 NOTE — Telephone Encounter (Signed)
 Scheduled appointments per 9/4 los. Called and left VM for the patients spouse Mrs.Apolinar with appointment details for the patient.

## 2024-05-07 ENCOUNTER — Other Ambulatory Visit: Payer: Self-pay

## 2024-05-10 ENCOUNTER — Other Ambulatory Visit (HOSPITAL_COMMUNITY): Payer: Self-pay

## 2024-05-10 ENCOUNTER — Other Ambulatory Visit: Payer: Self-pay

## 2024-05-10 NOTE — Progress Notes (Signed)
 Specialty Pharmacy Refill Coordination Note  Clifford Jenkins is a 63 y.o. male contacted today regarding refills of specialty medication(s) Abiraterone  Acetate (ZYTIGA )   Patient requested [facility] at [facility] at [place] date: 05/12/24   Medication will be filled on 05/11/24.

## 2024-05-11 ENCOUNTER — Other Ambulatory Visit: Payer: Self-pay

## 2024-06-04 ENCOUNTER — Other Ambulatory Visit (HOSPITAL_COMMUNITY): Payer: Self-pay

## 2024-06-09 ENCOUNTER — Other Ambulatory Visit: Payer: Self-pay

## 2024-06-09 NOTE — Progress Notes (Signed)
 Specialty Pharmacy Refill Coordination Note  Clifford Jenkins is a 63 y.o. male contacted today regarding refills of specialty medication(s) Abiraterone  Acetate (ZYTIGA )   Patient requested Delivery   Delivery date: 06/15/24   Verified address: [address]   [place] [address]   Medication will be filled on: 06/14/24

## 2024-06-14 ENCOUNTER — Other Ambulatory Visit: Payer: Self-pay

## 2024-06-24 ENCOUNTER — Other Ambulatory Visit: Payer: Self-pay

## 2024-06-29 ENCOUNTER — Other Ambulatory Visit: Payer: Self-pay

## 2024-06-29 NOTE — Progress Notes (Signed)
 Specialty Pharmacy Ongoing Clinical Assessment Note  Clifford Jenkins is a 63 y.o. male who is being followed by the specialty pharmacy service for RxSp Oncology   Patient's specialty medication(s) reviewed today: Abiraterone  Acetate (ZYTIGA )   Missed doses in the last 4 weeks: 0   Patient/Caregiver did not have any additional questions or concerns.   Therapeutic benefit summary: Patient is achieving benefit   Adverse events/side effects summary: No adverse events/side effects   Patient's therapy is appropriate to: Continue    Goals Addressed             This Visit's Progress    Slow Disease Progression   On track    Patient is on track. Patient will maintain adherence. PSA remains undetectable as of 04/15/24 labs.          Follow up: 12 months  Silvano LOISE Dolly Specialty Pharmacist

## 2024-07-05 ENCOUNTER — Telehealth: Payer: Self-pay

## 2024-07-05 NOTE — Telephone Encounter (Signed)
 Rescheduled patient appts. Called and spoke with the patient he is aware.

## 2024-07-07 ENCOUNTER — Other Ambulatory Visit: Payer: Self-pay

## 2024-07-07 ENCOUNTER — Other Ambulatory Visit (HOSPITAL_COMMUNITY): Payer: Self-pay

## 2024-07-07 NOTE — Progress Notes (Signed)
 Specialty Pharmacy Refill Coordination Note  Clifford Jenkins is a 63 y.o. male contacted today regarding refills of specialty medication(s) Abiraterone  Acetate (ZYTIGA )   Patient requested Delivery   Delivery date: 07/16/24   Verified address: [address]   [place] [address]   Medication will be filled on: 07/15/24

## 2024-07-14 NOTE — Progress Notes (Unsigned)
 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 Patient started ADT with Eligard  and abiraterone  with prednisone  and PSA has been undetectable.   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 Somatic: not yet Assessment & Plan   No orders of the defined types were placed in this encounter.    Pauletta BROCKS Tina, MD  INTERVAL HISTORY: Patient returns for follow-up.  Oncology History Overview Note   Cancer Staging  Prostate cancer metastatic to bone [facility]) Staging form: Prostate, AJCC 8th Edition - Clinical: Stage IVB (cTX, cNX, pM1c) - Signed by Amadeo Windell SAILOR, MD on 09/07/2021     Prostate cancer metastatic to bone [facility])  08/23/2021 Initial Diagnosis   Prostate cancer metastatic to bone [facility])   09/07/2021 Cancer Staging   Staging form: Prostate, AJCC 8th Edition - Clinical: Stage IVB (cTX, cNX, pM1c) - Signed by Amadeo Windell SAILOR, MD on 09/07/2021   06/05/2022 Genetic Testing   Negative genetics--[facility] CustomNext-Cancer +RNAinsight Panel.  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 Report date is 06/05/2022.    The CustomNext-Cancer+RNAinsight panel offered by [employer] includes sequencing and rearrangement analysis for the following 47 genes:  APC, ATM, AXIN2, BARD1, BMPR1A, BRCA1, BRCA2, BRIP1, CDH1, CDK4, CDKN2A, CHEK2, DICER1, EPCAM, GREM1, HOXB13, MEN1, MLH1, MSH2, MSH3, MSH6, MUTYH, NBN, NF1, NF2, NTHL1, PALB2, PMS2, POLD1, POLE, PTEN, RAD51C, RAD51D, RECQL, RET, SDHA, SDHAF2, SDHB, SDHC, SDHD, SMAD4,  SMARCA4, STK11, TP53, TSC1, TSC2, and VHL.  RNA data is routinely analyzed for use in variant interpretation for all genes.      PHYSICAL EXAMINATION: ECOG PERFORMANCE STATUS: {CHL ONC ECOG PS:[phone]}  There were no vitals filed for this visit. There were no vitals filed for this visit.  GENERAL: alert, no distress and comfortable SKIN: skin color normal and no jaundice or bruising or petechiae on exposed skin EYES: normal, sclera clear OROPHARYNX: no exudate  NECK: No palpable mass LYMPH:  no palpable cervical, axillary lymphadenopathy  LUNGS: clear to auscultation and no wheeze or rales with normal breathing effort HEART: regular rate & rhythm  ABDOMEN: abdomen soft, non-tender and nondistended. Musculoskeletal: no edema NEURO: no focal motor/sensory deficits  Relevant data reviewed during this visit included labs.  New labs ordered.

## 2024-07-15 ENCOUNTER — Other Ambulatory Visit: Payer: Self-pay

## 2024-07-15 ENCOUNTER — Inpatient Hospital Stay

## 2024-07-15 ENCOUNTER — Inpatient Hospital Stay: Attending: Oncology

## 2024-07-15 ENCOUNTER — Ambulatory Visit

## 2024-07-15 ENCOUNTER — Other Ambulatory Visit

## 2024-07-15 ENCOUNTER — Other Ambulatory Visit: Payer: Self-pay | Admitting: *Deleted

## 2024-07-15 VITALS — BP 155/73 | HR 77 | Temp 97.8°F | Resp 18 | Ht 67.0 in | Wt 196.3 lb

## 2024-07-15 DIAGNOSIS — C7951 Secondary malignant neoplasm of bone: Secondary | ICD-10-CM

## 2024-07-15 DIAGNOSIS — I159 Secondary hypertension, unspecified: Secondary | ICD-10-CM | POA: Diagnosis not present

## 2024-07-15 DIAGNOSIS — C61 Malignant neoplasm of prostate: Secondary | ICD-10-CM | POA: Insufficient documentation

## 2024-07-15 DIAGNOSIS — E291 Testicular hypofunction: Secondary | ICD-10-CM | POA: Insufficient documentation

## 2024-07-15 DIAGNOSIS — Z5111 Encounter for antineoplastic chemotherapy: Secondary | ICD-10-CM | POA: Diagnosis present

## 2024-07-15 DIAGNOSIS — Z9189 Other specified personal risk factors, not elsewhere classified: Secondary | ICD-10-CM | POA: Diagnosis not present

## 2024-07-15 DIAGNOSIS — Z79818 Long term (current) use of other agents affecting estrogen receptors and estrogen levels: Secondary | ICD-10-CM | POA: Insufficient documentation

## 2024-07-15 LAB — CBC WITH DIFFERENTIAL (CANCER CENTER ONLY)
Abs Immature Granulocytes: 0.02 K/uL (ref 0.00–0.07)
Basophils Absolute: 0 K/uL (ref 0.0–0.1)
Basophils Relative: 1 %
Eosinophils Absolute: 0.1 K/uL (ref 0.0–0.5)
Eosinophils Relative: 2 %
HCT: 43.5 % (ref 39.0–52.0)
Hemoglobin: 15.3 g/dL (ref 13.0–17.0)
Immature Granulocytes: 0 %
Lymphocytes Relative: 33 %
Lymphs Abs: 1.8 K/uL (ref 0.7–4.0)
MCH: 30.4 pg (ref 26.0–34.0)
MCHC: 35.2 g/dL (ref 30.0–36.0)
MCV: 86.5 fL (ref 80.0–100.0)
Monocytes Absolute: 0.5 K/uL (ref 0.1–1.0)
Monocytes Relative: 10 %
Neutro Abs: 2.9 K/uL (ref 1.7–7.7)
Neutrophils Relative %: 54 %
Platelet Count: 182 K/uL (ref 150–400)
RBC: 5.03 MIL/uL (ref 4.22–5.81)
RDW: 11.9 % (ref 11.5–15.5)
WBC Count: 5.4 K/uL (ref 4.0–10.5)
nRBC: 0 % (ref 0.0–0.2)

## 2024-07-15 LAB — CMP (CANCER CENTER ONLY)
ALT: 18 U/L (ref 0–44)
AST: 23 U/L (ref 15–41)
Albumin: 4.4 g/dL (ref 3.5–5.0)
Alkaline Phosphatase: 82 U/L (ref 38–126)
Anion gap: 9 (ref 5–15)
BUN: 16 mg/dL (ref 8–23)
CO2: 26 mmol/L (ref 22–32)
Calcium: 9.7 mg/dL (ref 8.9–10.3)
Chloride: 109 mmol/L (ref 98–111)
Creatinine: 1.04 mg/dL (ref 0.61–1.24)
GFR, Estimated: >60 mL/min (ref >60)
Glucose, Bld: 106 mg/dL — ABNORMAL HIGH (ref 70–99)
Potassium: 4.4 mmol/L (ref 3.5–5.1)
Sodium: 143 mmol/L (ref 135–145)
Total Bilirubin: 0.6 mg/dL (ref 0.0–1.2)
Total Protein: 7.4 g/dL (ref 6.5–8.1)

## 2024-07-15 LAB — PSA: Prostatic Specific Antigen: <0.02 ng/mL (ref 0.00–4.00)

## 2024-07-15 MED ORDER — AMLODIPINE-VALSARTAN-HCTZ 10-320-25 MG PO TABS: 1.0000 | ORAL_TABLET | Freq: Every day | ORAL | 0 refills | Status: AC

## 2024-07-15 NOTE — Assessment & Plan Note (Addendum)
 Continue AAP with ADT Last ADT was Eligard  45 mg in Dec. Discussed PET to evaluate for any residual disease for consolidation. Repeat PSA every 3 months Will return next month to discuss PSMA PET results.

## 2024-07-15 NOTE — Assessment & Plan Note (Signed)
 Patient will be seeing PCP next month.  He ran out of hypertensive medication.  Refilled 1 month supply for him today.

## 2024-07-15 NOTE — Assessment & Plan Note (Addendum)
 Calcium  1200 mg and Vit D 1000 international units daily Bone density. Will see if can be done at [facility] Will give a dose of Zometa next visit.  If having osteopenia, will continue to Zometa. Continue routine dental care.

## 2024-07-16 ENCOUNTER — Inpatient Hospital Stay

## 2024-07-16 VITALS — BP 155/93 | HR 99 | Temp 97.5°F | Resp 17

## 2024-07-16 DIAGNOSIS — C61 Malignant neoplasm of prostate: Secondary | ICD-10-CM

## 2024-07-16 DIAGNOSIS — Z5111 Encounter for antineoplastic chemotherapy: Secondary | ICD-10-CM | POA: Diagnosis not present

## 2024-07-16 LAB — TESTOSTERONE: Testosterone: <3 ng/dL — ABNORMAL LOW (ref 264–916)

## 2024-07-16 MED ORDER — LEUPROLIDE ACETATE (4 MONTH) 30 MG IM KIT: 30.0000 mg | PACK | Freq: Once | INTRAMUSCULAR | Status: DC

## 2024-07-16 MED ORDER — SODIUM CHLORIDE 0.9 % IV SOLN: INTRAVENOUS | Status: DC

## 2024-07-16 MED ORDER — HEPARIN SOD (PORK) LOCK FLUSH 100 UNIT/ML IV SOLN: 500.0000 [IU] | Freq: Once | INTRAVENOUS | Status: DC | PRN

## 2024-07-16 MED ORDER — ZOLEDRONIC ACID 4 MG/100ML IV SOLN
4.0000 mg | Freq: Once | INTRAVENOUS | Status: AC
  Administered 2024-07-16: 4 mg via INTRAVENOUS
  Filled 2024-07-16: qty 100

## 2024-07-16 MED ORDER — ALTEPLASE 2 MG IJ SOLR: 2.0000 mg | Freq: Once | INTRAMUSCULAR | Status: DC | PRN

## 2024-07-16 MED ORDER — LEUPROLIDE ACETATE (4 MONTH) 30 MG ~~LOC~~ KIT
30.0000 mg | PACK | Freq: Once | SUBCUTANEOUS | Status: AC
  Administered 2024-07-16: 30 mg via SUBCUTANEOUS
  Filled 2024-07-16: qty 30

## 2024-07-16 MED ORDER — SODIUM CHLORIDE 0.9% FLUSH: 10.0000 mL | Freq: Once | INTRAVENOUS | Status: DC | PRN

## 2024-07-16 MED ORDER — HEPARIN SOD (PORK) LOCK FLUSH 100 UNIT/ML IV SOLN: 250.0000 [IU] | Freq: Once | INTRAVENOUS | Status: DC | PRN

## 2024-07-16 MED ORDER — SODIUM CHLORIDE 0.9% FLUSH: 3.0000 mL | Freq: Once | INTRAVENOUS | Status: DC | PRN

## 2024-07-16 NOTE — Patient Instructions (Signed)

## 2024-07-19 ENCOUNTER — Encounter: Payer: Self-pay | Admitting: Hematology

## 2024-07-19 NOTE — Addendum Note (Signed)
 Addended by: TAMELA RAISIN R on: 07/19/2024 10:48 AM   Modules accepted: Orders

## 2024-07-30 ENCOUNTER — Emergency Department (HOSPITAL_COMMUNITY)

## 2024-07-30 ENCOUNTER — Observation Stay (HOSPITAL_COMMUNITY)
Admission: EM | Admit: 2024-07-30 | Discharge: 2024-07-31 | Disposition: A | Attending: Emergency Medicine | Admitting: Emergency Medicine

## 2024-07-30 ENCOUNTER — Other Ambulatory Visit: Payer: Self-pay

## 2024-07-30 ENCOUNTER — Encounter (HOSPITAL_COMMUNITY): Payer: Self-pay | Admitting: Emergency Medicine

## 2024-07-30 DIAGNOSIS — E876 Hypokalemia: Principal | ICD-10-CM | POA: Diagnosis present

## 2024-07-30 DIAGNOSIS — K219 Gastro-esophageal reflux disease without esophagitis: Secondary | ICD-10-CM

## 2024-07-30 DIAGNOSIS — C7951 Secondary malignant neoplasm of bone: Secondary | ICD-10-CM | POA: Diagnosis not present

## 2024-07-30 DIAGNOSIS — R110 Nausea: Secondary | ICD-10-CM | POA: Insufficient documentation

## 2024-07-30 DIAGNOSIS — C61 Malignant neoplasm of prostate: Secondary | ICD-10-CM | POA: Diagnosis not present

## 2024-07-30 DIAGNOSIS — I700 Atherosclerosis of aorta: Secondary | ICD-10-CM | POA: Diagnosis not present

## 2024-07-30 DIAGNOSIS — Z79899 Other long term (current) drug therapy: Secondary | ICD-10-CM | POA: Diagnosis not present

## 2024-07-30 DIAGNOSIS — E782 Mixed hyperlipidemia: Secondary | ICD-10-CM | POA: Diagnosis present

## 2024-07-30 DIAGNOSIS — I10 Essential (primary) hypertension: Secondary | ICD-10-CM | POA: Diagnosis present

## 2024-07-30 DIAGNOSIS — R1084 Generalized abdominal pain: Secondary | ICD-10-CM | POA: Diagnosis present

## 2024-07-30 DIAGNOSIS — N289 Disorder of kidney and ureter, unspecified: Secondary | ICD-10-CM | POA: Insufficient documentation

## 2024-07-30 DIAGNOSIS — R531 Weakness: Secondary | ICD-10-CM | POA: Diagnosis present

## 2024-07-30 LAB — URINALYSIS, ROUTINE W REFLEX MICROSCOPIC
Bilirubin Urine: NEGATIVE
Glucose, UA: NEGATIVE mg/dL
Ketones, ur: NEGATIVE mg/dL
Leukocytes,Ua: NEGATIVE
Nitrite: NEGATIVE
Protein, ur: NEGATIVE mg/dL
Specific Gravity, Urine: 1.01 (ref 1.005–1.030)
pH: 7 (ref 5.0–8.0)

## 2024-07-30 LAB — CBC WITH DIFFERENTIAL/PLATELET
Abs Immature Granulocytes: 0.05 K/uL (ref 0.00–0.07)
Basophils Absolute: 0 K/uL (ref 0.0–0.1)
Basophils Relative: 0 %
Eosinophils Absolute: 0.2 K/uL (ref 0.0–0.5)
Eosinophils Relative: 2 %
HCT: 40.6 % (ref 39.0–52.0)
Hemoglobin: 14.4 g/dL (ref 13.0–17.0)
Immature Granulocytes: 1 %
Lymphocytes Relative: 17 %
Lymphs Abs: 1.7 K/uL (ref 0.7–4.0)
MCH: 30.6 pg (ref 26.0–34.0)
MCHC: 35.5 g/dL (ref 30.0–36.0)
MCV: 86.2 fL (ref 80.0–100.0)
Monocytes Absolute: 1.4 K/uL — ABNORMAL HIGH (ref 0.1–1.0)
Monocytes Relative: 14 %
Neutro Abs: 6.7 K/uL (ref 1.7–7.7)
Neutrophils Relative %: 66 %
Platelets: 279 K/uL (ref 150–400)
RBC: 4.71 MIL/uL (ref 4.22–5.81)
RDW: 11.8 % (ref 11.5–15.5)
WBC: 10 K/uL (ref 4.0–10.5)
nRBC: 0 % (ref 0.0–0.2)

## 2024-07-30 LAB — I-STAT CHEM 8, ED
BUN: 11 mg/dL (ref 8–23)
BUN: 7 mg/dL — ABNORMAL LOW (ref 8–23)
Calcium, Ion: 1.03 mmol/L — ABNORMAL LOW (ref 1.15–1.40)
Calcium, Ion: 0.85 mmol/L — CL (ref 1.15–1.40)
Chloride: 99 mmol/L (ref 98–111)
Chloride: 108 mmol/L (ref 98–111)
Creatinine, Ser: 1 mg/dL (ref 0.61–1.24)
Creatinine, Ser: 0.7 mg/dL (ref 0.61–1.24)
Glucose, Bld: 108 mg/dL — ABNORMAL HIGH (ref 70–99)
Glucose, Bld: 78 mg/dL (ref 70–99)
HCT: 38 % — ABNORMAL LOW (ref 39.0–52.0)
HCT: 28 % — ABNORMAL LOW (ref 39.0–52.0)
Hemoglobin: 12.9 g/dL — ABNORMAL LOW (ref 13.0–17.0)
Hemoglobin: 9.5 g/dL — ABNORMAL LOW (ref 13.0–17.0)
Potassium: 2.4 mmol/L — CL (ref 3.5–5.1)
Potassium: 2.6 mmol/L — CL (ref 3.5–5.1)
Sodium: 144 mmol/L (ref 135–145)
Sodium: 146 mmol/L — ABNORMAL HIGH (ref 135–145)
TCO2: 30 mmol/L (ref 22–32)
TCO2: 22 mmol/L (ref 22–32)

## 2024-07-30 LAB — COMPREHENSIVE METABOLIC PANEL WITH GFR
ALT: 26 U/L (ref 0–44)
AST: 30 U/L (ref 15–41)
Albumin: 3.7 g/dL (ref 3.5–5.0)
Alkaline Phosphatase: 95 U/L (ref 38–126)
Anion gap: 11 (ref 5–15)
BUN: 12 mg/dL (ref 8–23)
CO2: 31 mmol/L (ref 22–32)
Calcium: 8.9 mg/dL (ref 8.9–10.3)
Chloride: 100 mmol/L (ref 98–111)
Creatinine, Ser: 1.03 mg/dL (ref 0.61–1.24)
GFR, Estimated: >60 mL/min
Glucose, Bld: 127 mg/dL — ABNORMAL HIGH (ref 70–99)
Potassium: 2.6 mmol/L — CL (ref 3.5–5.1)
Sodium: 143 mmol/L (ref 135–145)
Total Bilirubin: 0.9 mg/dL (ref 0.0–1.2)
Total Protein: 7.3 g/dL (ref 6.5–8.1)

## 2024-07-30 LAB — URINALYSIS, MICROSCOPIC (REFLEX)

## 2024-07-30 LAB — RESP PANEL BY RT-PCR (RSV, FLU A&B, COVID)  RVPGX2
Influenza A by PCR: NEGATIVE
Influenza B by PCR: NEGATIVE
Resp Syncytial Virus by PCR: NEGATIVE
SARS Coronavirus 2 by RT PCR: NEGATIVE

## 2024-07-30 LAB — MAGNESIUM: Magnesium: 2.6 mg/dL — ABNORMAL HIGH (ref 1.7–2.4)

## 2024-07-30 LAB — LIPASE, BLOOD: Lipase: 19 U/L (ref 11–51)

## 2024-07-30 LAB — POTASSIUM: Potassium: 2.8 mmol/L — ABNORMAL LOW (ref 3.5–5.1)

## 2024-07-30 MED ORDER — ENOXAPARIN SODIUM 40 MG/0.4ML IJ SOSY
40.0000 mg | PREFILLED_SYRINGE | INTRAMUSCULAR | Status: DC
  Administered 2024-07-30: 40 mg via SUBCUTANEOUS
  Filled 2024-07-30: qty 0.4

## 2024-07-30 MED ORDER — PANTOPRAZOLE SODIUM 40 MG PO TBEC: 40.0000 mg | DELAYED_RELEASE_TABLET | Freq: Every day | ORAL | 0 refills | Status: DC

## 2024-07-30 MED ORDER — ONDANSETRON HCL 4 MG/2ML IJ SOLN
4.0000 mg | Freq: Once | INTRAMUSCULAR | Status: AC
  Administered 2024-07-30: 4 mg via INTRAVENOUS
  Filled 2024-07-30: qty 2

## 2024-07-30 MED ORDER — PROCHLORPERAZINE EDISYLATE 10 MG/2ML IJ SOLN: 10.0000 mg | Freq: Four times a day (QID) | INTRAMUSCULAR | Status: DC | PRN

## 2024-07-30 MED ORDER — ABIRATERONE ACETATE 250 MG PO TABS: 1000.0000 mg | ORAL_TABLET | Freq: Every day | ORAL | Status: DC

## 2024-07-30 MED ORDER — POTASSIUM CHLORIDE 10 MEQ/100ML IV SOLN
10.0000 meq | INTRAVENOUS | Status: DC
  Administered 2024-07-30 – 2024-07-31 (×3): 10 meq via INTRAVENOUS
  Filled 2024-07-30 (×2): qty 100

## 2024-07-30 MED ORDER — ATORVASTATIN CALCIUM 40 MG PO TABS
40.0000 mg | ORAL_TABLET | Freq: Every day | ORAL | Status: DC
  Administered 2024-07-31: 40 mg via ORAL
  Filled 2024-07-30: qty 1

## 2024-07-30 MED ORDER — PANTOPRAZOLE SODIUM 40 MG IV SOLR
40.0000 mg | Freq: Once | INTRAVENOUS | Status: AC
  Administered 2024-07-30: 40 mg via INTRAVENOUS
  Filled 2024-07-30: qty 10

## 2024-07-30 MED ORDER — IOHEXOL 350 MG/ML SOLN
75.0000 mL | Freq: Once | INTRAVENOUS | Status: AC | PRN
  Administered 2024-07-30: 75 mL via INTRAVENOUS

## 2024-07-30 MED ORDER — POTASSIUM CHLORIDE 10 MEQ/100ML IV SOLN
10.0000 meq | INTRAVENOUS | Status: AC
  Administered 2024-07-30 (×3): 10 meq via INTRAVENOUS
  Filled 2024-07-30: qty 100

## 2024-07-30 MED ORDER — ACETAMINOPHEN 650 MG RE SUPP: 650.0000 mg | Freq: Four times a day (QID) | RECTAL | Status: DC | PRN

## 2024-07-30 MED ORDER — SENNOSIDES-DOCUSATE SODIUM 8.6-50 MG PO TABS: 1.0000 | ORAL_TABLET | Freq: Every evening | ORAL | Status: DC | PRN

## 2024-07-30 MED ORDER — POTASSIUM CHLORIDE 10 MEQ/100ML IV SOLN
10.0000 meq | Freq: Once | INTRAVENOUS | Status: AC
  Administered 2024-07-30: 10 meq via INTRAVENOUS
  Filled 2024-07-30: qty 100

## 2024-07-30 MED ORDER — SODIUM CHLORIDE 0.9 % IV SOLN: Freq: Once | INTRAVENOUS | Status: AC

## 2024-07-30 MED ORDER — ACETAMINOPHEN 325 MG PO TABS: 650.0000 mg | ORAL_TABLET | Freq: Four times a day (QID) | ORAL | Status: DC | PRN

## 2024-07-30 MED ORDER — POTASSIUM CHLORIDE CRYS ER 20 MEQ PO TBCR
40.0000 meq | EXTENDED_RELEASE_TABLET | Freq: Once | ORAL | Status: AC
  Administered 2024-07-30: 40 meq via ORAL
  Filled 2024-07-30: qty 2

## 2024-07-30 MED ORDER — SODIUM CHLORIDE 0.9% FLUSH
3.0000 mL | Freq: Two times a day (BID) | INTRAVENOUS | Status: DC
  Administered 2024-07-31: 3 mL via INTRAVENOUS

## 2024-07-30 MED ORDER — MORPHINE SULFATE (PF) 2 MG/ML IV SOLN
4.0000 mg | Freq: Once | INTRAVENOUS | Status: AC
  Administered 2024-07-30: 4 mg via INTRAVENOUS
  Filled 2024-07-30: qty 2

## 2024-07-30 MED ORDER — PREDNISONE 5 MG PO TABS
5.0000 mg | ORAL_TABLET | Freq: Every day | ORAL | Status: DC
  Administered 2024-07-31: 5 mg via ORAL
  Filled 2024-07-30: qty 1
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
## 2024-07-30 NOTE — ED Provider Notes (Cosign Needed)
 " [facility] Provider Note   CSN: [id number] Arrival date & time: 07/30/24  9179     Patient presents with: Weakness   Clifford Jenkins is a 63 y.o. male with PMHx HTN, HLD, CAD, asthma, metastatic prostate cancer on Zytiga  who presents to ED concerned for intermittent symptoms over the past 2-3 weeks. Patient endorsing headaches, weakness, and generalized abdominal pain. Patient also with lightheadedness and overall does not feel well. Patient stating that abdominal pain worsens after meals, causing him to eat less over the past couple of weeks. However, sometimes the abdominal pain also happens whether or not he has eaten recently. Patient also with intermittent nausea. Patient denies fever, chest pain, SOB, cough, nausea, vomiting, diarrhea, dysuria, hematuria. Last BM was in the past 24 hours and appeared normal and non-bloody. Patient has not been taking any OTC medications for their symptoms.    Weakness Associated symptoms: abdominal pain        Prior to Admission medications  Medication Sig Start Date End Date Taking? Authorizing Provider  pantoprazole (PROTONIX) 40 MG tablet Take 1 tablet (40 mg total) by mouth daily. 07/30/24 08/13/24 Yes Yanni Ruberg, Nidia F, PA-C  abiraterone  acetate (ZYTIGA ) 250 MG tablet Take 4 tablets (1,000 mg total) by mouth daily. Take on an empty stomach 1 hour before or 2 hours after a meal 10/13/23   Tina Pauletta BROCKS, MD  amLODIPine -Valsartan -HCTZ 10-320-25 MG TABS Take 1 tablet by mouth daily at 2 PM. 07/15/24   Tina Pauletta BROCKS, MD  atorvastatin  (LIPITOR) 40 MG tablet Take 1 tablet (40 mg total) by mouth daily. 12/05/22   Patwardhan, Newman PARAS, MD  calcium -vitamin D (OSCAL WITH D) 500-5 MG-MCG tablet Take 1 tablet by mouth 2 (two) times daily. 09/07/21   Shadad, Firas N, MD  denosumab  (XGEVA ) 120 MG/1.7ML SOLN injection Inject 1.7 mLs (120 mg total) into the skin every 3 (three) months. Ok to administer if the  following parameters are met: No active dental issues, normal Creatinine & calcium  level >8.5 01/27/24   Lanny Callander, MD  docusate sodium  (COLACE) 100 MG capsule Take 1 capsule (100 mg total) by mouth 2 (two) times daily. 08/23/21   Bergman, Meghan D, NP  methocarbamol  (ROBAXIN ) 500 MG tablet Take 1 tablet (500 mg total) by mouth 2 (two) times daily. 08/30/21   Angiulli, Toribio PARAS, PA-C  polyethylene glycol (MIRALAX  / GLYCOLAX ) 17 g packet Take 17 g by mouth daily. 08/30/21   Angiulli, Toribio PARAS, PA-C  potassium chloride  SA (KLOR-CON  M) 20 MEQ tablet Take 1 tablet (20 mEq total) by mouth daily. 04/15/24   Tina Pauletta BROCKS, MD  predniSONE  (DELTASONE ) 5 MG tablet TAKE 1 TABLET BY MOUTH EVERY DAY WITH BREAKFAST 04/15/24   Tina Pauletta BROCKS, MD    Allergies: Patient has no known allergies.    Review of Systems  Gastrointestinal:  Positive for abdominal pain.  Neurological:  Positive for weakness.    Updated Vital Signs BP 99/67   Pulse 78   Temp 98.5 F (36.9 C)   Resp 15   SpO2 98%   Physical Exam Vitals and nursing note reviewed.  Constitutional:      General: He is not in acute distress.    Appearance: He is not ill-appearing or toxic-appearing.  HENT:     Head: Normocephalic and atraumatic.     Mouth/Throat:     Mouth: Mucous membranes are moist.  Eyes:     General: No scleral icterus.  Right eye: No discharge.        Left eye: No discharge.     Conjunctiva/sclera: Conjunctivae normal.  Cardiovascular:     Rate and Rhythm: Normal rate and regular rhythm.     Pulses: Normal pulses.     Heart sounds: Normal heart sounds. No murmur heard. Pulmonary:     Effort: Pulmonary effort is normal. No respiratory distress.     Breath sounds: Normal breath sounds. No wheezing, rhonchi or rales.  Abdominal:     General: Abdomen is flat. Bowel sounds are normal. There is no distension.     Palpations: Abdomen is soft. There is no mass.     Tenderness: There is no abdominal tenderness.   Musculoskeletal:     Right lower leg: No edema.     Left lower leg: No edema.  Skin:    General: Skin is warm and dry.     Findings: No rash.  Neurological:     General: No focal deficit present.     Mental Status: He is alert and oriented to person, place, and time. Mental status is at baseline.  Psychiatric:        Mood and Affect: Mood normal.        Behavior: Behavior normal.     (all labs ordered are listed, but only abnormal results are displayed) Labs Reviewed  CBC WITH DIFFERENTIAL/PLATELET - Abnormal; Notable for the following components:      Result Value   Monocytes Absolute 1.4 (*)    All other components within normal limits  COMPREHENSIVE METABOLIC PANEL WITH GFR - Abnormal; Notable for the following components:   Potassium 2.6 (*)    Glucose, Bld 127 (*)    All other components within normal limits  I-STAT CHEM 8, ED - Abnormal; Notable for the following components:   Potassium 2.4 (*)    Glucose, Bld 108 (*)    Calcium , Ion 1.03 (*)    Hemoglobin 12.9 (*)    HCT 38.0 (*)    All other components within normal limits  RESP PANEL BY RT-PCR (RSV, FLU A&B, COVID)  RVPGX2  LIPASE, BLOOD  URINALYSIS, ROUTINE W REFLEX MICROSCOPIC  MAGNESIUM  I-STAT CHEM 8, ED    EKG: None  Radiology: CT CHEST ABDOMEN PELVIS W CONTRAST Result Date: 07/30/2024 CLINICAL DATA:  Evaluate for metastatic disease. Brain metastasis suspected. EMR history includes metastatic prostate cancer to bone. * Tracking Code: BO * EXAM: CT CHEST, ABDOMEN, AND PELVIS WITH CONTRAST TECHNIQUE: Multidetector CT imaging of the chest, abdomen and pelvis was performed following the standard protocol during bolus administration of intravenous contrast. RADIATION DOSE REDUCTION: This exam was performed according to the departmental dose-optimization program which includes automated exposure control, adjustment of the mA and/or kV according to patient size and/or use of iterative reconstruction technique.  CONTRAST:  75mL OMNIPAQUE  IOHEXOL  350 MG/ML SOLN COMPARISON:  10/21/2023 lumbar spine CT. Prior chest abdomen and pelvic CTs of 08/19/2021. FINDINGS: CT CHEST FINDINGS Cardiovascular: Aortic atherosclerosis. Moderate cardiomegaly, without pericardial effusion. Lad and left circumflex coronary artery calcification. No central pulmonary embolism, on this non-dedicated study. Mediastinum/Nodes: No supraclavicular adenopathy. No axillary adenopathy. No mediastinal or hilar adenopathy. Lungs/Pleura: No pleural fluid. Mild degradation secondary to patient arm position, not raised above the head. Minimal motion degradation in the lower chest. Given these limitations, no dominant pulmonary nodule or mass. Musculoskeletal: Included within the abdomen and pelvis section. CT ABDOMEN PELVIS FINDINGS Hepatobiliary: Caudate and lateral segment left liver lobe cysts. No suspicious  liver lesion. Normal gallbladder, without biliary ductal dilatation. Pancreas: Normal, without mass or ductal dilatation. Spleen: Normal in size, without focal abnormality. Adrenals/Urinary Tract: Arm and EKG wire/lead artifacts continuing into the upper abdomen. Bilateral adrenal thickening. Anterior interpolar left renal 1.9 x 1.7 cm lesion on image 67/4. This demonstrates enhancement compared to the noncontrast exam of 08/19/2021, where it measured 2.0 x 1.7 cm. Normal urinary bladder. Stomach/Bowel: Gastric antral underdistention. Normal colon, appendix, and terminal ileum. Normal small bowel. Vascular/Lymphatic: Aortic atherosclerosis. Patent renal veins. No abdominopelvic adenopathy. Reproductive: Normal prostate. Other: No significant free fluid.  No free intraperitoneal air. Musculoskeletal: Intramuscular lipoma of 2.5 cm within the proximal quadriceps of the left lower extremity. Multifocal sclerotic lesions are more well-defined today. Index lesion within the posterior right iliac of 6.4 cm is estimated at 6.0 cm on the prior. Right lateral  third rib expansile lesion is more well-defined today on image 25/4 without residual surrounding soft tissue component. Interval T5-7 trans pedicle screw fixation. IMPRESSION: 1. Mild multifactorial degradation, including motion and patient arm position. 2. Enhancing 1.9 cm anterior left renal lesion is most consistent with renal cell carcinoma. This is similar in size to 08/19/2021, favoring a relatively indolent histology. 3. Widespread sclerotic osseous metastasis (presumably related to prostate primary), slightly more well-defined today. Most likely related to interval healing. 4. Incidental findings, including: Coronary artery atherosclerosis. Aortic Atherosclerosis (ICD10-I70.0). Electronically Signed   By: Rockey Kilts M.D.   On: 07/30/2024 14:09   CT HEAD W & WO CONTRAST ( ) Result Date: 07/30/2024 EXAM: CT HEAD WITHOUT AND WITH 07/30/2024 11:30:32 AM TECHNIQUE: CT of the head was performed without and with the administration of 75 mL of iohexol  (OMNIPAQUE ) 350 MG/ML injection. Automated exposure control, iterative reconstruction, and/or weight based adjustment of the mA/kV was utilized to reduce the radiation dose to as low as reasonably achievable. COMPARISON: CT head without contrast 05/23/2019. CLINICAL HISTORY: Brain metastases suspected. History of metastatic prostate cancer. FINDINGS: BRAIN AND VENTRICLES: There is no evidence of an acute infarct, intracranial hemorrhage, mass, midline shift, hydrocephalus, or extra-axial fluid collection. Cerebral volume is normal. No abnormal enhancement is identified. The major dural venous sinuses and large intracranial arteries are grossly patent. ORBITS: No acute abnormality. SINUSES AND MASTOIDS: Small mucous retention cyst in the left maxillary sinus. Clear mastoid air cells. SOFT TISSUES AND SKULL: No acute skull fracture. No acute soft tissue abnormality. IMPRESSION: 1. No evidence of an acute intracranial abnormality or intracranial metastatic  disease. Electronically signed by: Dasie Hamburg MD 07/30/2024 11:45 AM EST RP Workstation: [id number]     .Critical Care  Performed by: Hoy Nidia FALCON, PA-C Authorized by: Hoy Nidia FALCON, PA-C   Critical care provider statement:    Critical care time (minutes):  30   Critical care was necessary to treat or prevent imminent or life-threatening deterioration of the following conditions: hypokalemia.   Critical care was time spent personally by me on the following activities:  Development of treatment plan with patient or surrogate, discussions with consultants, evaluation of patient's response to treatment, examination of patient, ordering and review of laboratory studies, ordering and review of radiographic studies, ordering and performing treatments and interventions, pulse oximetry, re-evaluation of patient's condition and review of old charts    Medications Ordered in the ED  potassium chloride  10 mEq in 100 mL IVPB (10 mEq Intravenous New Bag/Given 07/30/24 1439)  potassium chloride  10 mEq in 100 mL IVPB (has no administration in time range)  morphine  (PF) 2 MG/ML injection  4 mg (4 mg Intravenous Given 07/30/24 1031)  ondansetron  (ZOFRAN ) injection 4 mg (4 mg Intravenous Given 07/30/24 1032)  pantoprazole (PROTONIX) injection 40 mg (40 mg Intravenous Given 07/30/24 1031)  potassium chloride  SA (KLOR-CON  M) CR tablet 40 mEq (40 mEq Oral Given 07/30/24 1135)  0.9 %  sodium chloride  infusion (0 mLs Intravenous Stopped 07/30/24 1440)  iohexol  (OMNIPAQUE ) 350 MG/ML injection 75 mL (75 mLs Intravenous Contrast Given 07/30/24 1131)                                    Medical Decision Making Amount and/or Complexity of Data Reviewed Labs: ordered.  Risk Prescription drug management.    This patient presents to the ED for concern of abdominal pain, this involves an extensive number of treatment options, and is a complaint that carries with it a high risk of complications and  morbidity.  The differential diagnosis includes gastroenteritis, colitis, small bowel obstruction, appendicitis, cholecystitis, pancreatitis, nephrolithiasis, UTI, pyelonephritis   Co morbidities that complicate the patient evaluation  HTN, HLD, CAD, asthma, prostate cancer in remission    Additional history obtained:  Dr. Alben PCP    Problem List / ED Course / Critical interventions / Medication management  Patient presented for intermittent episodes of abdominal pain, nausea, weakness, and headaches over the past 3 weeks. Physical exam reassuring. Patient afebrile with stable vitals. I Ordered, and personally interpreted labs.  CBC without leukocytosis or anemia.  CMP with hypokalemia 2.6.  Lipase within normal limits.  Respiratory panel negative.  Magnesium pending.  UA pending. I ordered imaging studies including CT Abd/Pelvis and CT head.  I independently visualized and interpreted imaging and I agree with the radiologist interpretation of no acute process - there is renal and bone lesions which patient was already aware of. I personally viewed and interpreted the EKG/cardiac monitored which showed an underlying rhythm of: Sinus rhythm. Patient feeling a lot better after 2 rounds of potassium were infused. Patient stating that he would like to go home after potassium infusions are done. Patients intermittent abdominal pain that mostly worsens with food intake is more concerning for PUD. Will prescribe protonix. Staffed with Dr. Laurice. I have reviewed the patients home medicines and have made adjustments as needed   Social Determinants of Health:  none  3PM Care of Clifford Jenkins  transferred to PA Smoot at the end of my shift as the patient will require reassessment once labs/imaging have resulted. Patient presentation, ED course, and plan of care discussed with review of all pertinent labs and imaging. Please see his/her note for further details regarding further ED course  and disposition. Plan at time of handoff is reassess patient after potassium supplementation and labs. He may be eligible for discharge. This may be altered or completely changed at the discretion of the oncoming team pending results of further workup.      Final diagnoses:  Hypokalemia  Gastroesophageal reflux disease, unspecified whether esophagitis present    ED Discharge Orders          Ordered    pantoprazole (PROTONIX) 40 MG tablet  Daily        07/30/24 1443               Hoy Nidia FALCON, [place] 07/30/24 1447  "

## 2024-07-30 NOTE — ED Provider Notes (Signed)
 Care assumed from [facility], PA-C at shift change. Please see their note for further information Physical Exam  BP 99/67   Pulse 78   Temp 98.5 F (36.9 C)   Resp 15   SpO2 98%   Physical Exam Vitals and nursing note reviewed.  Constitutional:      General: He is not in acute distress.    Appearance: Normal appearance. He is normal weight. He is not ill-appearing, toxic-appearing or diaphoretic.  HENT:     Head: Normocephalic and atraumatic.  Cardiovascular:     Rate and Rhythm: Normal rate.  Pulmonary:     Effort: Pulmonary effort is normal. No respiratory distress.  Musculoskeletal:        General: Normal range of motion.     Cervical back: Normal range of motion.  Skin:    General: Skin is warm and dry.  Neurological:     General: No focal deficit present.     Mental Status: He is alert.  Psychiatric:        Mood and Affect: Mood normal.        Behavior: Behavior normal.     Procedures  .Critical Care  Performed by: Nora Lauraine LABOR, PA-C Authorized by: Marquies Wanat A, PA-C   Critical care provider statement:    Critical care time (minutes):  35   Critical care was necessary to treat or prevent imminent or life-threatening deterioration of the following conditions: hypokalemia with initial EKG changes requiring multiple rounds of IV potassium.   Critical care was time spent personally by me on the following activities:  Development of treatment plan with patient or surrogate, discussions with primary provider, evaluation of patient's response to treatment, examination of patient, obtaining history from patient or surrogate, ordering and review of laboratory studies, pulse oximetry, ordering and review of radiographic studies, re-evaluation of patient's condition and review of old charts   Care discussed with: admitting provider     ED Course / MDM   Clinical Course as of 07/30/24 1934  Fri Jul 30, 2024  1739 Repeat Qtc normalized on repeat ecg [MT]     Clinical Course User Index [MT] Trifan, Donnice PARAS, MD   Medical Decision Making Amount and/or Complexity of Data Reviewed Labs: ordered.  Risk Prescription drug management.   Briefly: Patient presents with generalized weakness, abdominal pain, poor po intake recently. On hydrochlorothiazide, reports he takes a daily potassium supplement as well. Found to have K 2.6, mag WNL. CT shows metastatic disease which is known to the patient, he is on daily oral medication for metastatic prostate cancer. Patient has received 2 rounds of IV potassium and 1 round of oral potassium, recheck I-stat shows potassium is still 2.6. Plan for 1 more round of IV potassium and recheck. If 3.0 or above, plan for d/c. If less than 3.0, plan for admit. Also, upon review does look like patient had a prolonged Qtc upon arrival today which could be related to hyperkalemia, plan to recheck.  Recheck EKG reviewed by myself and my attending Dr. Cottie and shows improvement of QTc  After patients 3rd round of IV potassium, patients recheck K is 2.8. Upon reassessment, patient does confirm that his abdominal pain has improved and he does feel some better. However given persistent hyperkalemia, feel patient would benefit from admission. Discussed same with patient who is understanding and in agreement with this.   Discussed patient with hospitalist Dr. LULLA Blanch who accepts patient for admission     Nora Lauraine  A, PA-C 07/30/24 2002    Cottie Donnice PARAS, MD 07/30/24 2239

## 2024-07-30 NOTE — ED Triage Notes (Signed)
 Pt here from  home with c/o general body aches , off  and  on for 3 weeks , no fevers cough or n/v

## 2024-07-30 NOTE — Hospital Course (Signed)
 Clifford Jenkins is a 63 y.o. 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

## 2024-07-30 NOTE — ED Provider Triage Note (Signed)
 Emergency Medicine Provider Triage Evaluation Note  Clifford Jenkins , a 63 y.o. male  was evaluated in triage.  Pt complains of abdominal pain, headache, and generalized weakness.  The patient states he has been feeling unwell now for the past 3 weeks.  Denies any fevers, cough, shortness of breath.  He is currently being treated for prostate cancer with metastases.  States that he is having some generalized abdominal pain and intermittent nausea as well as intermittent headaches.  He came to the ER for further evaluation regarding this.  Review of Systems  Positive: See above Negative: Focal weakness  Physical Exam  BP 111/71 (BP Location: Right Arm)   Pulse 93   Temp 98.5 F (36.9 C)   Resp 16   SpO2 98%  Gen:   Awake, no distress   Resp:  Normal effort  MSK:   Moves extremities without difficulty  Other:  No reproducible tenderness on the abdominal exam although he reports that he hurts all over  Medical Decision Making  Medically screening exam initiated at 8:52 AM.  Appropriate orders placed.  FABIO WAH was informed that the remainder of the evaluation will be completed by another provider, this initial triage assessment does not replace that evaluation, and the importance of remaining in the ED until their evaluation is complete.  Will obtain labs to evaluate for electrolyte abnormalities or hypercalcemia given his cancer history.  Will obtain CT scans of his head, chest, abdomen, and pelvis for metastatic disease evaluation.  Morphine  Zofran  for symptoms.   Ula Prentice SAUNDERS, MD 07/30/24 [phone]

## 2024-07-31 ENCOUNTER — Other Ambulatory Visit (HOSPITAL_COMMUNITY): Payer: Self-pay

## 2024-07-31 DIAGNOSIS — E876 Hypokalemia: Secondary | ICD-10-CM | POA: Diagnosis not present

## 2024-07-31 LAB — CBC
HCT: 39.2 % (ref 39.0–52.0)
Hemoglobin: 14 g/dL (ref 13.0–17.0)
MCH: 30.4 pg (ref 26.0–34.0)
MCHC: 35.7 g/dL (ref 30.0–36.0)
MCV: 85 fL (ref 80.0–100.0)
Platelets: 262 K/uL (ref 150–400)
RBC: 4.61 MIL/uL (ref 4.22–5.81)
RDW: 11.9 % (ref 11.5–15.5)
WBC: 7.4 K/uL (ref 4.0–10.5)
nRBC: 0 % (ref 0.0–0.2)

## 2024-07-31 LAB — BASIC METABOLIC PANEL WITH GFR
Anion gap: 11 (ref 5–15)
BUN: 11 mg/dL (ref 8–23)
CO2: 24 mmol/L (ref 22–32)
Calcium: 8.1 mg/dL — ABNORMAL LOW (ref 8.9–10.3)
Chloride: 105 mmol/L (ref 98–111)
Creatinine, Ser: 0.82 mg/dL (ref 0.61–1.24)
GFR, Estimated: >60 mL/min
Glucose, Bld: 93 mg/dL (ref 70–99)
Potassium: 3.4 mmol/L — ABNORMAL LOW (ref 3.5–5.1)
Sodium: 139 mmol/L (ref 135–145)

## 2024-07-31 LAB — MAGNESIUM: Magnesium: 2.5 mg/dL — ABNORMAL HIGH (ref 1.7–2.4)

## 2024-07-31 LAB — HIV ANTIBODY (ROUTINE TESTING W REFLEX): HIV Screen 4th Generation wRfx: NONREACTIVE

## 2024-07-31 MED ORDER — OXYCODONE HCL 5 MG PO TABS: 5.0000 mg | ORAL_TABLET | ORAL | Status: DC | PRN

## 2024-07-31 MED ORDER — POTASSIUM CHLORIDE CRYS ER 20 MEQ PO TBCR
40.0000 meq | EXTENDED_RELEASE_TABLET | Freq: Once | ORAL | Status: AC
  Administered 2024-07-31: 40 meq via ORAL
  Filled 2024-07-31: qty 2

## 2024-07-31 MED ORDER — POTASSIUM CHLORIDE CRYS ER 20 MEQ PO TBCR
20.0000 meq | EXTENDED_RELEASE_TABLET | Freq: Two times a day (BID) | ORAL | 0 refills | Status: DC
  Filled 2024-07-31: qty 60, 30d supply, fill #0

## 2024-07-31 MED ORDER — POTASSIUM CHLORIDE 10 MEQ/100ML IV SOLN
10.0000 meq | INTRAVENOUS | Status: AC
  Administered 2024-07-31 (×3): 10 meq via INTRAVENOUS
  Filled 2024-07-31 (×4): qty 100

## 2024-07-31 MED ORDER — ORAL CARE MOUTH RINSE: 15.0000 mL | OROMUCOSAL | Status: DC | PRN

## 2024-07-31 MED ORDER — HYDROMORPHONE HCL 1 MG/ML IJ SOLN
0.5000 mg | INTRAMUSCULAR | Status: DC | PRN
  Administered 2024-07-31: 0.5 mg via INTRAVENOUS
  Filled 2024-07-31: qty 0.5

## 2024-07-31 NOTE — Progress Notes (Signed)
 DISCHARGE NOTE HOME Clifford Jenkins to be discharged Home per MD order. Discussed prescriptions and follow up appointments with the patient. Prescriptions given to patient; medication list explained in detail. Patient verbalized understanding.  Skin clean, dry and intact without evidence of skin break down, no evidence of skin tears noted. IV catheter discontinued intact. Site without signs and symptoms of complications. Dressing and pressure applied. Pt denies pain at the site currently. No complaints noted.  Patient free of lines, drains, and wounds.   An After Visit Summary (AVS) was printed and given to the patient. Patient escorted via wheelchair, and discharged home via private auto.  Doyal Sias, RN

## 2024-07-31 NOTE — Plan of Care (Signed)
  Problem: Health Behavior/Discharge Planning: Goal: Ability to manage health-related needs will improve Outcome: Adequate for Discharge   Problem: Clinical Measurements: Goal: Ability to maintain clinical measurements within normal limits will improve Outcome: Adequate for Discharge Goal: Will remain free from infection Outcome: Adequate for Discharge Goal: Diagnostic test results will improve Outcome: Adequate for Discharge Goal: Respiratory complications will improve Outcome: Adequate for Discharge Goal: Cardiovascular complication will be avoided Outcome: Adequate for Discharge   Problem: Activity: Goal: Risk for activity intolerance will decrease Outcome: Adequate for Discharge   Problem: Nutrition: Goal: Adequate nutrition will be maintained Outcome: Adequate for Discharge   Problem: Coping: Goal: Level of anxiety will decrease Outcome: Adequate for Discharge   Problem: Elimination: Goal: Will not experience complications related to bowel motility Outcome: Adequate for Discharge Goal: Will not experience complications related to urinary retention Outcome: Adequate for Discharge   Problem: Pain Managment: Goal: General experience of comfort will improve and/or be controlled Outcome: Adequate for Discharge   Problem: Safety: Goal: Ability to remain free from injury will improve Outcome: Adequate for Discharge   Problem: Skin Integrity: Goal: Risk for impaired skin integrity will decrease Outcome: Adequate for Discharge

## 2024-07-31 NOTE — Progress Notes (Signed)
 Patient TOC meds delivered to room and escorted via wheel chair to ER entrance to private auto for discharge.

## 2024-07-31 NOTE — Discharge Summary (Signed)
 " Physician Discharge Summary   Patient: Clifford Jenkins MRN: [id number] DOB: [birth year]  Admit date:     07/30/2024  Discharge date: 07/31/2024  Discharge Physician: Lonni SHAUNNA Dalton   PCP: Alben Therisa MATSU, PA     Recommendations at discharge:  Follow up with PCP Therisa Alben for hypokalemia Defer to PCP regarding K supplement vs transition to alternative BP medication Follow up with Dr. Tina regarding renal lesion     Discharge Diagnoses: Principal Problem:   Hypokalemia Active Problems:   Prostate cancer metastatic to bone [facility])   Hypertension   Mixed hyperlipidemia   Kidney lesion, native, left      Hospital Course: Clifford Jenkins is a 63 y.o. 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     Hypokalemia Patient given supplemental K, symptoms resolved, felt well.  Given he has been stable on hydrochlorothiazide since June 2024 with normal or low normal K, I have increased his K supplement, but do not think cessation is absolutely necessary.  Recommend double K chloride dose for one week and repeat BMP.  Defer transition from hydrochlorothiazide to alternative to PCP.    Left renal lesion Noted on CT as far back as 2023 but I do not see that this has been addressed by Oncology and patient believes he has never seen Urology - Recommend Oncology follow up             The [place]  Controlled Substances Registry was reviewed for this patient prior to discharge.  Consultants: None Procedures performed: None  Disposition: Home Diet recommendation:  Cardiac diet  DISCHARGE MEDICATION: Allergies as of 07/31/2024   No Known Allergies      Medication List     TAKE these medications    abiraterone  acetate 250 MG tablet Commonly known as: ZYTIGA  Take 4 tablets (1,000 mg total) by mouth daily. Take on an empty stomach 1 hour before or 2 hours after a  meal   amLODIPine -Valsartan -HCTZ 10-320-25 MG Tabs Take 1 tablet by mouth daily at 2 PM. What changed: when to take this   atorvastatin  40 MG tablet Commonly known as: LIPITOR Take 1 tablet (40 mg total) by mouth daily.   calcium -vitamin D 500-5 MG-MCG tablet Commonly known as: OSCAL WITH D Take 1 tablet by mouth 2 (two) times daily.   potassium chloride  SA 20 MEQ tablet Commonly known as: KLOR-CON  M Take 1 tablet (20 mEq total) by mouth 2 (two) times daily. What changed: when to take this   predniSONE  5 MG tablet Commonly known as: DELTASONE  TAKE 1 TABLET BY MOUTH EVERY DAY WITH BREAKFAST   Xgeva  120 MG/1.7ML Soln injection Generic drug: denosumab  Inject 1.7 mLs (120 mg total) into the skin every 3 (three) months. Ok to administer if the following parameters are met: No active dental issues, normal Creatinine & calcium  level >8.5        Follow-up Information     Alben Therisa MATSU, PA. Schedule an appointment as soon as possible for a visit in 1 week(s).   Specialty: Family Medicine Why: [place] information: [phone] [place] [address] [phone]         Tina Pauletta BROCKS, MD. Call.   Specialty: Oncology Why: Just call the nurse to ask about the kidney spot Contact information: [address] [place] [address] [phone]  Discharge Instructions     Discharge instructions   Complete by: As directed    **IMPORTANT DISCHARGE INSTRUCTIONS**   From Dr. Jonel: You were admitted for low potassium levels  Here, you had them replaced and your level is better  INCREASE your potassium dose Instead of just potassium chloride  20 mEq in the morning, add an ADDITIONAL dose at night In other words, take potassium 20 mEq twice daily in the morning and at night before bed  Go see your primary doctors office to have labs checked in 1 week   For the kidney spot, the radiologists said  this: IMPRESSION: Enhancing 1.9 cm anterior left renal lesion is most consistent with renal cell carcinoma. This is similar in size to 08/19/2021, favoring a relatively indolent histology.   Indolent histology means not growing fast  Discuss with Dr. Tina, I have forwarded this to him   Increase activity slowly   Complete by: As directed        Discharge Exam: Filed Weights   07/30/24 2302  Weight: 82.9 kg    General: Pt is alert, awake, not in acute distress Cardiovascular: RRR, nl S1-S2, no murmurs appreciated.   No LE edema.   Respiratory: Normal respiratory rate and rhythm.  CTAB without rales or wheezes. Abdominal: Abdomen soft and non-tender.  No distension or HSM.   Neuro/Psych: Strength symmetric in upper and lower extremities.  Judgment and insight appear normal.   Condition at discharge: good  The results of significant diagnostics from this hospitalization (including imaging, microbiology, ancillary and laboratory) are listed below for reference.   Imaging Studies: CT CHEST ABDOMEN PELVIS W CONTRAST Result Date: 07/30/2024 CLINICAL DATA:  Evaluate for metastatic disease. Brain metastasis suspected. EMR history includes metastatic prostate cancer to bone. * Tracking Code: BO * EXAM: CT CHEST, ABDOMEN, AND PELVIS WITH CONTRAST TECHNIQUE: Multidetector CT imaging of the chest, abdomen and pelvis was performed following the standard protocol during bolus administration of intravenous contrast. RADIATION DOSE REDUCTION: This exam was performed according to the departmental dose-optimization program which includes automated exposure control, adjustment of the mA and/or kV according to patient size and/or use of iterative reconstruction technique. CONTRAST:  75mL OMNIPAQUE  IOHEXOL  350 MG/ML SOLN COMPARISON:  10/21/2023 lumbar spine CT. Prior chest abdomen and pelvic CTs of 08/19/2021. FINDINGS: CT CHEST FINDINGS Cardiovascular: Aortic atherosclerosis. Moderate cardiomegaly,  without pericardial effusion. Lad and left circumflex coronary artery calcification. No central pulmonary embolism, on this non-dedicated study. Mediastinum/Nodes: No supraclavicular adenopathy. No axillary adenopathy. No mediastinal or hilar adenopathy. Lungs/Pleura: No pleural fluid. Mild degradation secondary to patient arm position, not raised above the head. Minimal motion degradation in the lower chest. Given these limitations, no dominant pulmonary nodule or mass. Musculoskeletal: Included within the abdomen and pelvis section. CT ABDOMEN PELVIS FINDINGS Hepatobiliary: Caudate and lateral segment left liver lobe cysts. No suspicious liver lesion. Normal gallbladder, without biliary ductal dilatation. Pancreas: Normal, without mass or ductal dilatation. Spleen: Normal in size, without focal abnormality. Adrenals/Urinary Tract: Arm and EKG wire/lead artifacts continuing into the upper abdomen. Bilateral adrenal thickening. Anterior interpolar left renal 1.9 x 1.7 cm lesion on image 67/4. This demonstrates enhancement compared to the noncontrast exam of 08/19/2021, where it measured 2.0 x 1.7 cm. Normal urinary bladder. Stomach/Bowel: Gastric antral underdistention. Normal colon, appendix, and terminal ileum. Normal small bowel. Vascular/Lymphatic: Aortic atherosclerosis. Patent renal veins. No abdominopelvic adenopathy. Reproductive: Normal prostate. Other: No significant free fluid.  No free intraperitoneal air. Musculoskeletal: Intramuscular lipoma of 2.5 cm within  the proximal quadriceps of the left lower extremity. Multifocal sclerotic lesions are more well-defined today. Index lesion within the posterior right iliac of 6.4 cm is estimated at 6.0 cm on the prior. Right lateral third rib expansile lesion is more well-defined today on image 25/4 without residual surrounding soft tissue component. Interval T5-7 trans pedicle screw fixation. IMPRESSION: 1. Mild multifactorial degradation, including motion and  patient arm position. 2. Enhancing 1.9 cm anterior left renal lesion is most consistent with renal cell carcinoma. This is similar in size to 08/19/2021, favoring a relatively indolent histology. 3. Widespread sclerotic osseous metastasis (presumably related to prostate primary), slightly more well-defined today. Most likely related to interval healing. 4. Incidental findings, including: Coronary artery atherosclerosis. Aortic Atherosclerosis (ICD10-I70.0). Electronically Signed   By: Rockey Kilts M.D.   On: 07/30/2024 14:09   CT HEAD W & WO CONTRAST ( ) Result Date: 07/30/2024 EXAM: CT HEAD WITHOUT AND WITH 07/30/2024 11:30:32 AM TECHNIQUE: CT of the head was performed without and with the administration of 75 mL of iohexol  (OMNIPAQUE ) 350 MG/ML injection. Automated exposure control, iterative reconstruction, and/or weight based adjustment of the mA/kV was utilized to reduce the radiation dose to as low as reasonably achievable. COMPARISON: CT head without contrast 05/23/2019. CLINICAL HISTORY: Brain metastases suspected. History of metastatic prostate cancer. FINDINGS: BRAIN AND VENTRICLES: There is no evidence of an acute infarct, intracranial hemorrhage, mass, midline shift, hydrocephalus, or extra-axial fluid collection. Cerebral volume is normal. No abnormal enhancement is identified. The major dural venous sinuses and large intracranial arteries are grossly patent. ORBITS: No acute abnormality. SINUSES AND MASTOIDS: Small mucous retention cyst in the left maxillary sinus. Clear mastoid air cells. SOFT TISSUES AND SKULL: No acute skull fracture. No acute soft tissue abnormality. IMPRESSION: 1. No evidence of an acute intracranial abnormality or intracranial metastatic disease. Electronically signed by: Dasie Hamburg MD 07/30/2024 11:45 AM EST RP Workstation: [id number]    Microbiology: Results for orders placed or performed during the hospital encounter of 07/30/24  Resp panel by RT-PCR (RSV, Flu A&B,  Covid) Anterior Nasal Swab     Status: None   Collection Time: 07/30/24  8:53 AM   Specimen: Anterior Nasal Swab  Result Value Ref Range Status   SARS Coronavirus 2 by RT PCR NEGATIVE NEGATIVE Final   Influenza A by PCR NEGATIVE NEGATIVE Final   Influenza B by PCR NEGATIVE NEGATIVE Final    Comment: (NOTE) The Xpert Xpress SARS-CoV-2/FLU/RSV plus assay is intended as an aid in the diagnosis of influenza from Nasopharyngeal swab specimens and should not be used as a sole basis for treatment. Nasal washings and aspirates are unacceptable for Xpert Xpress SARS-CoV-2/FLU/RSV testing.  Fact Sheet for Patients: bloggercourse.com  Fact Sheet for Healthcare Providers: seriousbroker.it  This test is not yet approved or cleared by the [place]  FDA and has been authorized for detection and/or diagnosis of SARS-CoV-2 by FDA under an Emergency Use Authorization (EUA). This EUA will remain in effect (meaning this test can be used) for the duration of the COVID-19 declaration under Section 564(b)(1) of the Act, 21 U.S.C. section 360bbb-3(b)(1), unless the authorization is terminated or revoked.     Resp Syncytial Virus by PCR NEGATIVE NEGATIVE Final    Comment: (NOTE) Fact Sheet for Patients: bloggercourse.com  Fact Sheet for Healthcare Providers: seriousbroker.it  This test is not yet approved or cleared by the [place]  FDA and has been authorized for detection and/or diagnosis of SARS-CoV-2 by FDA under an Emergency Use Authorization (EUA). This  EUA will remain in effect (meaning this test can be used) for the duration of the COVID-19 declaration under Section 564(b)(1) of the Act, 21 U.S.C. section 360bbb-3(b)(1), unless the authorization is terminated or revoked.  Performed at [facility], [address] [place], [place] [address]     Labs: CBC: Recent Labs   Lab 07/30/24 [phone] 07/30/24 1101 07/30/24 1502 07/31/24 0608  WBC 10.0  --   --  7.4  NEUTROABS 6.7  --   --   --   HGB 14.4 12.9* 9.5* 14.0  HCT 40.6 38.0* 28.0* 39.2  MCV 86.2  --   --  85.0  PLT 279  --   --  262   Basic Metabolic Panel: Recent Labs  Lab 07/30/24 0851 07/30/24 1101 07/30/24 1122 07/30/24 1502 07/30/24 1809 07/31/24 0608  NA 143 144  --  146*  --  139  K 2.6* 2.4*  --  2.6* 2.8* 3.4*  CL 100 99  --  108  --  105  CO2 31  --   --   --   --  24  GLUCOSE 127* 108*  --  78  --  93  BUN 12 11  --  7*  --  11  CREATININE 1.03 1.00  --  0.70  --  0.82  CALCIUM  8.9  --   --   --   --  8.1*  MG  --   --  2.6*  --   --  2.5*   Liver Function Tests: Recent Labs  Lab 07/30/24 0851  AST 30  ALT 26  ALKPHOS 95  BILITOT 0.9  PROT 7.3  ALBUMIN 3.7   CBG: No results for input(s): GLUCAP in the last 168 hours.  Discharge time spent: approximately 45 minutes spent on discharge counseling, evaluation of patient on day of discharge, and coordination of discharge planning with nursing, social work, pharmacy and case management  Signed: Lonni SHAUNNA Dalton, MD [facility] 07/31/2024          "

## 2024-07-31 NOTE — Progress Notes (Signed)
 [facility] night cross cover note:   I have added as needed IV Dilaudid  for the patient's abdominal pain.    Eva Pore, DO Hospitalist

## 2024-08-06 ENCOUNTER — Other Ambulatory Visit (HOSPITAL_COMMUNITY): Payer: Self-pay

## 2024-08-09 ENCOUNTER — Other Ambulatory Visit: Payer: Self-pay

## 2024-08-09 ENCOUNTER — Encounter (HOSPITAL_COMMUNITY)
Admission: RE | Admit: 2024-08-09 | Discharge: 2024-08-09 | Disposition: A | Discharge status: Home / Self Care | Source: Ambulatory Visit

## 2024-08-09 DIAGNOSIS — C61 Malignant neoplasm of prostate: Secondary | ICD-10-CM | POA: Diagnosis present

## 2024-08-09 DIAGNOSIS — C7951 Secondary malignant neoplasm of bone: Secondary | ICD-10-CM | POA: Insufficient documentation

## 2024-08-09 MED ORDER — FLOTUFOLASTAT F 18 GALLIUM 296-5846 MBQ/ML IV SOLN
8.0000 | Freq: Once | INTRAVENOUS | Status: AC
  Administered 2024-08-09: 8 via INTRAVENOUS
  Filled 2024-08-09: qty 8

## 2024-08-13 ENCOUNTER — Other Ambulatory Visit: Payer: Self-pay

## 2024-08-13 ENCOUNTER — Other Ambulatory Visit (HOSPITAL_COMMUNITY): Payer: Self-pay

## 2024-08-13 NOTE — Progress Notes (Signed)
 Specialty Pharmacy Refill Coordination Note  Clifford Jenkins is a 64 y.o. male contacted today regarding refills of specialty medication(s) Abiraterone  Acetate (ZYTIGA )   Patient requested Delivery   Delivery date: 08/18/24   Verified address: [address]   [place] [address]   Medication will be filled on: 08/17/24

## 2024-08-17 ENCOUNTER — Telehealth: Payer: Self-pay

## 2024-08-17 ENCOUNTER — Encounter: Payer: Self-pay | Admitting: Hematology

## 2024-08-17 ENCOUNTER — Other Ambulatory Visit (HOSPITAL_COMMUNITY): Payer: Self-pay

## 2024-08-17 ENCOUNTER — Inpatient Hospital Stay: Attending: Oncology

## 2024-08-17 ENCOUNTER — Other Ambulatory Visit: Payer: Self-pay

## 2024-08-17 VITALS — HR 87 | Temp 97.5°F | Resp 18 | Ht 66.0 in | Wt 192.0 lb

## 2024-08-17 DIAGNOSIS — C7951 Secondary malignant neoplasm of bone: Secondary | ICD-10-CM | POA: Diagnosis not present

## 2024-08-17 DIAGNOSIS — E876 Hypokalemia: Secondary | ICD-10-CM | POA: Diagnosis not present

## 2024-08-17 DIAGNOSIS — Z9189 Other specified personal risk factors, not elsewhere classified: Secondary | ICD-10-CM | POA: Diagnosis not present

## 2024-08-17 DIAGNOSIS — C61 Malignant neoplasm of prostate: Secondary | ICD-10-CM | POA: Insufficient documentation

## 2024-08-17 DIAGNOSIS — Z79818 Long term (current) use of other agents affecting estrogen receptors and estrogen levels: Secondary | ICD-10-CM | POA: Diagnosis not present

## 2024-08-17 MED ORDER — PREDNISONE 5 MG PO TABS: ORAL_TABLET | ORAL | 1 refills | Status: AC

## 2024-08-17 MED ORDER — POTASSIUM CHLORIDE CRYS ER 20 MEQ PO TBCR: 20.0000 meq | EXTENDED_RELEASE_TABLET | Freq: Two times a day (BID) | ORAL | 3 refills | Status: AC

## 2024-08-17 NOTE — Assessment & Plan Note (Addendum)
 Calcium  1200 mg and Vit D 1000 international units daily Bone density pending. Will see if can be done at [facility] [place]  in 3 months.  Follow up bone density Continue routine dental care.

## 2024-08-17 NOTE — Progress Notes (Signed)
 [facility] OFFICE PROGRESS NOTE  Patient Care Team: Alben Therisa MATSU, PA as PCP - General (Family Medicine) Aleene Kitchens, MD (Inactive) as Attending Physician (Urology) Twana Jeneal DASEN, MD (Hematology and Oncology) Vertell Pont, RN as Oncology Nurse Navigator Tina Pauletta BROCKS, MD as Consulting Physician (Hematology and Oncology)  Patient is a 64 year old male with history of prostate cancer initially presented with de novo diffuse osseous metastases in 08/2021. Patient started ADT with Eligard  and abiraterone  with prednisone  and PSA has been undetectable.  Transferring care to me in 10/2023.   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 T5-6 and T6-7 laminectomy and resection of his thoracic tumor on August 19, 2021 and final pathology showed metastatic carcinoma compatible with prostate origin. PSA was 674    Genetic: Germline testing negative. Somatic: not yet  Discussed PET results showed complete response.   Small renal mass will need to be monitored.  Discussed increase exercises for weight gain and for bone health.  Hypokalemia recently. Improved. Refill potassium today. Assessment & Plan Prostate cancer metastatic to bone (HCC) Continue AAP with ADT Last ADT was Eligard  45 mg in Dec. Next in about 3 months. Repeat PSA every 3 months Hypokalemia Refilled  potassium chloride  20 mEq daily Continue 20 mEq twice daily At risk for side effect of medication Calcium  1200 mg and Vit D 1000 international units daily Bone density pending. Will see if can be done at [facility]  in 3 months.  Follow up bone density Continue routine dental care.  No orders of the defined types were placed in this encounter.    Pauletta BROCKS Tina, MD  INTERVAL HISTORY: Patient returns for follow-up. Overall feeling better. He was admitted for hypokalemia. No n/v or diarrhea. Report he is now taking potassium twice daily.  Oncology  History Overview Note   Cancer Staging  Prostate cancer metastatic to bone [facility]) Staging form: Prostate, AJCC 8th Edition - Clinical: Stage IVB (cTX, cNX, pM1c) - Signed by Amadeo Windell SAILOR, MD on 09/07/2021     Prostate cancer metastatic to bone [facility])  08/23/2021 Initial Diagnosis   Prostate cancer metastatic to bone [facility])   09/07/2021 Cancer Staging   Staging form: Prostate, AJCC 8th Edition - Clinical: Stage IVB (cTX, cNX, pM1c) - Signed by Amadeo Windell SAILOR, MD on 09/07/2021   06/05/2022 Genetic Testing   Negative genetics--[facility] CustomNext-Cancer +RNAinsight Panel.  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 Report date is 06/05/2022.    The CustomNext-Cancer+RNAinsight panel offered by [employer] includes sequencing and rearrangement analysis for the following 47 genes:  APC, ATM, AXIN2, BARD1, BMPR1A, BRCA1, BRCA2, BRIP1, CDH1, CDK4, CDKN2A, CHEK2, DICER1, EPCAM, GREM1, HOXB13, MEN1, MLH1, MSH2, MSH3, MSH6, MUTYH, NBN, NF1, NF2, NTHL1, PALB2, PMS2, POLD1, POLE, PTEN, RAD51C, RAD51D, RECQL, RET, SDHA, SDHAF2, SDHB, SDHC, SDHD, SMAD4, SMARCA4, STK11, TP53, TSC1, TSC2, and VHL.  RNA data is routinely analyzed for use in variant interpretation for all genes.      PHYSICAL EXAMINATION: ECOG PERFORMANCE STATUS: 0  Vitals:   08/17/24 1508  Pulse: 87  Resp: 18  Temp: (!) 97.5 F (36.4 C)  SpO2: 100%   Filed Weights   08/17/24 1508  Weight: 192 lb (87.1 kg)    GENERAL: alert, no distress and comfortable LUNGS: normal breathing effort  Relevant data reviewed during this visit included labs and imaging.  New labs ordered.

## 2024-08-17 NOTE — Assessment & Plan Note (Addendum)
 Refilled  potassium chloride  20 mEq daily Continue 20 mEq twice daily

## 2024-08-17 NOTE — Telephone Encounter (Signed)
 Oral Oncology Patient Advocate Encounter   Was successful in securing patient a $6000 grant from [employer] (PA[employer]) to provide copayment coverage for Zytiga .  This will keep the out of pocket expense at $0.     The billing information is as follows and has been shared with [facility].   RxBin: [id number] PCN:  [id number] Member ID: [id number] Group ID: [id number] Dates of Eligibility: 02/19/24 through 08/17/25  Prostate

## 2024-08-17 NOTE — Assessment & Plan Note (Addendum)
 Continue AAP with ADT Last ADT was Eligard  45 mg in Dec. Next in about 3 months. Repeat PSA every 3 months

## 2024-08-31 ENCOUNTER — Other Ambulatory Visit (HOSPITAL_COMMUNITY): Payer: Self-pay

## 2024-09-09 ENCOUNTER — Other Ambulatory Visit: Payer: Self-pay

## 2024-09-09 NOTE — Progress Notes (Signed)
 Specialty Pharmacy Refill Coordination Note  MARQUAL MI is a 64 y.o. male contacted today regarding refills of specialty medication(s) Abiraterone  Acetate (ZYTIGA )   Patient requested Delivery   Delivery date: 09/16/24   Verified address: [address]   [place] [address]   Medication will be filled on: 09/17/24

## 2024-09-10 ENCOUNTER — Other Ambulatory Visit: Payer: Self-pay

## 2024-09-13 ENCOUNTER — Other Ambulatory Visit: Payer: Self-pay

## 2024-09-16 ENCOUNTER — Other Ambulatory Visit: Payer: Self-pay

## 2024-11-16 ENCOUNTER — Inpatient Hospital Stay
